# Patient Record
Sex: Female | Born: 1935 | Race: White | Hispanic: No | Smoking: Never smoker
Health system: Southern US, Community
[De-identification: ages and names within clinical notes are randomized; demographics above are authoritative.]

## PROBLEM LIST (undated history)

## (undated) DIAGNOSIS — I471 Supraventricular tachycardia, unspecified: Secondary | ICD-10-CM

## (undated) DIAGNOSIS — I10 Essential (primary) hypertension: Secondary | ICD-10-CM

## (undated) DIAGNOSIS — E785 Hyperlipidemia, unspecified: Secondary | ICD-10-CM

## (undated) DIAGNOSIS — F0390 Unspecified dementia without behavioral disturbance: Secondary | ICD-10-CM

## (undated) DIAGNOSIS — N3010 Interstitial cystitis (chronic) without hematuria: Secondary | ICD-10-CM

## (undated) HISTORY — DX: Essential (primary) hypertension: I10

## (undated) HISTORY — DX: Supraventricular tachycardia: I47.1

## (undated) HISTORY — PX: REVISION UROSTOMY CUTANEOUS: SUR1282

## (undated) HISTORY — PX: CYSTECTOMY W/ URETEROILEAL CONDUIT: SUR361

## (undated) HISTORY — PX: CHOLECYSTECTOMY: SHX55

## (undated) HISTORY — DX: Hyperlipidemia, unspecified: E78.5

## (undated) HISTORY — DX: Supraventricular tachycardia, unspecified: I47.10

## (undated) HISTORY — PX: ABDOMINAL HYSTERECTOMY: SHX81

## (undated) HISTORY — DX: Interstitial cystitis (chronic) without hematuria: N30.10

---

## 1997-09-17 ENCOUNTER — Ambulatory Visit (HOSPITAL_BASED_OUTPATIENT_CLINIC_OR_DEPARTMENT_OTHER): Admission: RE | Admit: 1997-09-17 | Discharge: 1997-09-17 | Payer: Self-pay | Admitting: Orthopedic Surgery

## 1997-09-23 ENCOUNTER — Encounter: Admission: RE | Admit: 1997-09-23 | Discharge: 1997-12-22 | Payer: Self-pay | Admitting: Orthopedic Surgery

## 1998-01-14 ENCOUNTER — Encounter: Payer: Self-pay | Admitting: Urology

## 1998-01-16 ENCOUNTER — Ambulatory Visit (HOSPITAL_COMMUNITY): Admission: RE | Admit: 1998-01-16 | Discharge: 1998-01-16 | Payer: Self-pay | Admitting: Urology

## 1998-01-31 ENCOUNTER — Ambulatory Visit (HOSPITAL_COMMUNITY): Admission: RE | Admit: 1998-01-31 | Discharge: 1998-01-31 | Payer: Self-pay | Admitting: Urology

## 1999-03-05 ENCOUNTER — Encounter: Payer: Self-pay | Admitting: Urology

## 1999-03-06 ENCOUNTER — Ambulatory Visit (HOSPITAL_COMMUNITY): Admission: RE | Admit: 1999-03-06 | Discharge: 1999-03-06 | Payer: Self-pay | Admitting: Urology

## 1999-03-06 ENCOUNTER — Encounter: Payer: Self-pay | Admitting: Urology

## 1999-03-06 ENCOUNTER — Encounter (INDEPENDENT_AMBULATORY_CARE_PROVIDER_SITE_OTHER): Payer: Self-pay | Admitting: *Deleted

## 1999-10-05 ENCOUNTER — Encounter (INDEPENDENT_AMBULATORY_CARE_PROVIDER_SITE_OTHER): Payer: Self-pay | Admitting: Specialist

## 1999-10-05 ENCOUNTER — Encounter: Payer: Self-pay | Admitting: Urology

## 1999-10-05 ENCOUNTER — Inpatient Hospital Stay (HOSPITAL_COMMUNITY): Admission: RE | Admit: 1999-10-05 | Discharge: 1999-10-19 | Payer: Self-pay | Admitting: Urology

## 1999-10-12 ENCOUNTER — Encounter: Payer: Self-pay | Admitting: Urology

## 1999-10-13 ENCOUNTER — Encounter: Payer: Self-pay | Admitting: Urology

## 1999-10-16 ENCOUNTER — Encounter: Payer: Self-pay | Admitting: Gastroenterology

## 2000-07-13 ENCOUNTER — Encounter: Admission: RE | Admit: 2000-07-13 | Discharge: 2000-07-13 | Payer: Self-pay | Admitting: Urology

## 2000-07-13 ENCOUNTER — Encounter: Payer: Self-pay | Admitting: Urology

## 2004-02-18 ENCOUNTER — Ambulatory Visit: Payer: Self-pay | Admitting: Family Medicine

## 2004-04-10 ENCOUNTER — Ambulatory Visit: Payer: Self-pay | Admitting: Family Medicine

## 2004-08-03 ENCOUNTER — Other Ambulatory Visit: Admission: RE | Admit: 2004-08-03 | Discharge: 2004-08-03 | Payer: Self-pay | Admitting: Gynecology

## 2004-09-07 ENCOUNTER — Ambulatory Visit: Payer: Self-pay | Admitting: Family Medicine

## 2004-10-26 ENCOUNTER — Ambulatory Visit: Payer: Self-pay | Admitting: Family Medicine

## 2004-11-18 ENCOUNTER — Ambulatory Visit: Payer: Self-pay | Admitting: Family Medicine

## 2005-01-01 ENCOUNTER — Ambulatory Visit: Payer: Self-pay | Admitting: Family Medicine

## 2005-02-25 ENCOUNTER — Ambulatory Visit: Payer: Self-pay | Admitting: Family Medicine

## 2005-06-15 ENCOUNTER — Ambulatory Visit: Payer: Self-pay | Admitting: Family Medicine

## 2005-07-05 ENCOUNTER — Ambulatory Visit: Payer: Self-pay | Admitting: Family Medicine

## 2005-07-08 ENCOUNTER — Ambulatory Visit (HOSPITAL_COMMUNITY): Admission: RE | Admit: 2005-07-08 | Discharge: 2005-07-08 | Payer: Self-pay | Admitting: Orthopaedic Surgery

## 2005-07-12 ENCOUNTER — Encounter: Admission: RE | Admit: 2005-07-12 | Discharge: 2005-08-11 | Payer: Self-pay | Admitting: Orthopaedic Surgery

## 2005-08-30 ENCOUNTER — Ambulatory Visit: Payer: Self-pay | Admitting: Family Medicine

## 2005-09-02 ENCOUNTER — Other Ambulatory Visit: Admission: RE | Admit: 2005-09-02 | Discharge: 2005-09-02 | Payer: Self-pay | Admitting: Gynecology

## 2006-02-08 ENCOUNTER — Ambulatory Visit: Payer: Self-pay | Admitting: Family Medicine

## 2006-05-03 ENCOUNTER — Ambulatory Visit: Payer: Self-pay | Admitting: Family Medicine

## 2006-05-18 ENCOUNTER — Ambulatory Visit: Payer: Self-pay | Admitting: Family Medicine

## 2006-06-14 ENCOUNTER — Ambulatory Visit (HOSPITAL_COMMUNITY): Admission: RE | Admit: 2006-06-14 | Discharge: 2006-06-14 | Payer: Self-pay | Admitting: Ophthalmology

## 2006-07-05 ENCOUNTER — Ambulatory Visit (HOSPITAL_COMMUNITY): Admission: RE | Admit: 2006-07-05 | Discharge: 2006-07-05 | Payer: Self-pay | Admitting: Ophthalmology

## 2006-08-22 ENCOUNTER — Ambulatory Visit: Payer: Self-pay | Admitting: Family Medicine

## 2006-08-29 ENCOUNTER — Ambulatory Visit: Payer: Self-pay | Admitting: Family Medicine

## 2006-09-05 ENCOUNTER — Ambulatory Visit: Payer: Self-pay | Admitting: Family Medicine

## 2006-11-23 ENCOUNTER — Other Ambulatory Visit: Admission: RE | Admit: 2006-11-23 | Discharge: 2006-11-23 | Payer: Self-pay | Admitting: Gynecology

## 2007-07-21 ENCOUNTER — Ambulatory Visit (HOSPITAL_COMMUNITY): Admission: RE | Admit: 2007-07-21 | Discharge: 2007-07-21 | Payer: Self-pay | Admitting: Orthopaedic Surgery

## 2008-02-22 ENCOUNTER — Ambulatory Visit (HOSPITAL_COMMUNITY): Admission: RE | Admit: 2008-02-22 | Discharge: 2008-02-22 | Payer: Self-pay | Admitting: Neurology

## 2009-12-23 ENCOUNTER — Encounter: Admission: RE | Admit: 2009-12-23 | Discharge: 2009-12-23 | Payer: Self-pay | Admitting: Gynecology

## 2010-04-04 ENCOUNTER — Encounter: Payer: Self-pay | Admitting: Gynecology

## 2010-07-31 NOTE — H&P (Signed)
[facility]  Patient:    Theresa Hunt, Theresa Hunt [facility]                 MRN: [id number] Adm. Date:  [id number] Attending:  Londell Moh CC:         Jamison Neighbor, M.D.             Curly Shores, M.D.                         History and Physical  ADMITTING DIAGNOSIS:  End-stage interstitial cystitis with persistent hematuria and chronic pain.  HISTORY OF PRESENT ILLNESS:  This 77 year old female has had interstitial cystitis for many years.  She has failed standard therapy, including DMSL, Elmiron, Atarax, antidepressants, Neurontin, as well as self inspiration therapy using heparin and Marcaine.  The patient has been offered alternative therapies, such as the SKK drug study, _________ implant program, as well as the possibility of various forms of surgical intervention.  The patient has elected to undergo a cystectomy and ileal conduit diversion.  She understands there is no guarantee this will control her pain but will allow it to eliminate her every five minutes urinary frequency.  She understands that there are potential complications with the procedure which have been carefully discussed with her.  She understands all the potential alternative therapy and has elected to undergo this procedure.  PAST MEDICAL HISTORY:  Otherwise unremarkable.  She does have known hypertension and has some problems with arthritis.  CURRENT MEDICATIONS:  Atenolol and Premarin.  She had been on Elmiron and other medications, all of which she has discontinued.  PAST SURGICAL HISTORY:  The patients previous surgery includes cholecystectomy, total abdominal hysterectomy, various urologic procedures, several orthopedic procedures including right wrist surgery.  SOCIAL HISTORY:  Unremarkable.  She is married and has three children.  FAMILY HISTORY:  Noncontributory.  There is a history of anemia, heart disease, diabetes, COPD, stroke, and thyroid disease.  She does not  smoke or use alcohol.  PHYSICAL EXAMINATION:  GENERAL:  On examination today she is a well-developed, well-nourished female.  VITAL SIGNS:  Temperature 97.5, pulse 57, respirations 20, blood pressure 157/68.  HEENT:  Normocephalic, atraumatic.  Cranial nerves 2-12 grossly intact.  NECK:  Supple, no adenopathy or thyromegaly.  LUNGS:  Clear.  HEART:  Regular rate and rhythm without murmurs, thrills, gallops, rubs, or heaves.  ABDOMEN:  Abdomen shows a lower midline incision that is well healed, as well as a _________ incision with no complications.  PELVIC:  Shows no abnormalities, other than the fact that she has extreme pain.  She has no cystocele, rectocele, as well as no mass, although I understand she had a recent Pap smear that was normal.  EXTREMITIES:  No cyanosis, clubbing, edema.  NEUROLOGIC:  Appears grossly intact.  IMPRESSION:  End-stage interstitial cystitis with chronic hematuria.  PLAN:  Cystectomy, _________  diverse. DD:  10/06/99 TD:  10/07/99 Job: [id number]

## 2010-07-31 NOTE — Discharge Summary (Signed)
[facility]  Patient:    Theresa Hunt, Theresa Hunt [facility]                 MRN: [id number] Adm. Date:  [id number] Disc. Date: [id number] Attending:  Londell Moh                           Discharge Summary  PRINCIPAL DIAGNOSES: 1. Chronic interstitial cystitis. 2. Hematuria. 3. Postoperative ileus. 4. Hypertension.  PRINCIPAL PROCEDURE:  Cystectomy and ileal conduit on November 06, 1999.  HISTORY OF PRESENT ILLNESS:  This 77 year old female has had interstitial cystitis for many years.  She has failed standard therapy including DMSO, Elmiron, Atarax, antidepressants, Neurontin, and intravesical installations. The patient has been offered opportunities such as experimental drug programs or ______ implantation but has requested that a cystectomy be performed.  She was fully appraised as to all the risks and benefits, including the very real potential complications, as well as the possibility that this may not eliminate her pain.  She is to be admitted in preparation for a cystectomy.  PAST MEDICAL HISTORY:  Unremarkable.  She is known to have: 1. Hypertension. 2. Arthritis.  ADMISSION MEDICATIONS:  Atenolol and Premarin.  The patient discontinued all her bladder medications, as none of them were working.  PAST SURGICAL HISTORY: 1. Cholecystectomy. 2. Total abdominal hysterectomy. 3. Several urologic procedures. 4. Several orthopedic procedures.  SOCIAL HISTORY, FAMILY HISTORY, REVIEW OF SYSTEMS:  Are placed on the initial history and physical.  LABORATORY DATA:  The patients laboratory values were normal.  HOSPITAL COURSE:  She was given an appropriate preoperative preparation, including type and cross for blood, bowel prep, antibiotic therapy.  On October 06, 1999, she underwent cystectomy and conduit diversion.  At first her postoperative course was quite unremarkable.  Her postoperative hematocrit was 37.4, with a hemoglobin of 13.4.  The only  abnormality noted was a slight increase in potassium, which was thought to be a hemolyzed specimen.  Urine output was slightly down at first but improved with appropriate hydration. The patient was moved along to a regular diet without much difficulty.  The jp drainage on the right-hand side was relatively brisk; on the left-hand side it was quite minimal.  The JP drainage was ______ , and it turned out to have a creatinine of less than 1, suggesting this was lymphatic fluid and not urine. The patient developed problems with nausea and pain on the evening of October 12, 1999.  Dr. Patsi Sears was called about a KUB that had been ordered which showed evidence of free air in the abdomen.  Obviously, the patient had a recent cystectomy.  It was felt by the radiologist that this might represent more free air than one normally sees.  Dr. Patsi Sears obtained a CT scan which showed a question of a mass in the left lower quadrant.  He initially had ordered a CT-directed biopsy, but it was clear to me that this most likely represented a resolving hematoma.  The patients white count increased rapidly to 26,000.  She was put back on antibiotic therapies, and her white count then dropped back down towards the normal range.  Some spontaneous drainage coming out of the vagina was noted, and it was felt that this was most likely a resolving hematoma.  The patient had multiple bowel movements.  We had some difficulty with nausea.  We made several attempts to get her NG out.  When we were having difficulty  with that, GI was consulted.  They noted that she did have some evidence of postoperative ileus, but we were able to remove the NG tube after several days.  The patient had multiple bowel movements, multiple flatus, and her temperature had dropped down.  By October 19, 1999, she was eating a regular diet.  She had had no temperature in over 24 hours.  She had moved her bowels several times, and her white blood  count had dropped to normal.  The patient felt well and wanted to go home.  Arrangements were made for her to see a home health care nurse to deal with her conduit.  Her staples had been removed.  Her wound looked good, and her JP drains had both been removed, as had her double-J stents. DD:  10/23/99 TD:  10/25/99 Job: [id number]

## 2010-07-31 NOTE — Op Note (Signed)
[facility]  Patient:    Theresa Hunt, Theresa Hunt [facility]                 MRN: [id number] Proc. Date: 10/06/99 Adm. Date:  [id number] Disc. Date: [id number] Attending:  Londell Moh                           Operative Report  REDICTATION  PREOPERATIVE DIAGNOSIS:  End-stage interstitial cystitis with uncontrolled pain.  POSTOPERATIVE DIAGNOSIS:  End-stage interstitial cystitis with uncontrolled pain.  PROCEDURE:  Cystectomy and ileoconduit urinary diversion.  SURGEON:  Jamison Neighbor, M.D.  ASSISTANT:  Lindaann Slough, M.D.  ANESTHESIA:  General.  COMPLICATIONS:  None.  DRAINS:  Bilateral single-J catheter and two Jackson-Pratt drains plus an NG tube.  BRIEF HISTORY:  This is a 77 year old female who has severe interstitial cystitis with associated hematuria.  The patient has poorly controlled pain despite the use of various agents including OxyContin, Duragesic patches, etc. She had failed all forms of standard therapy and is completely incapacitated. The patient has requested that a cystectomy be performed.  She has been appraised of this with all the risks and benefits including the possibility of injury to adjacent structures, the possibility of transfusion, as well as the fact that there is no guarantee this will eliminate her pain.  She gave full and informed consent.  DESCRIPTION OF PROCEDURE:  After the successful induction of general anesthesia, the patient was placed in the modified lithotomy position in low lithotomy stirrups.  She was then prepped and draped in the usual sterile fashion.  A midline incision was made and carried down through Scarpas fascia with old incision in the lower abdomen that were utilized.  The peritoneum was entered superiorly.  Multiple adhesions were taken down.  Attempt was made to dissect the peritoneum off the back of the bladder and try to salvage the peritoneum where possible.  This was successfully  done.  The ureters were identified.  They were dissected away from the retroperitoneal tissues.  They were dissected free all the way down to the entry into the bladder where it was doubly clipped and transected, and this was done on both sides.  The space between the bladder and vagina were then very carefully developed using a combination of electrocautery and sharp dissection.  The pedicles were developed on each side and were taken down with #1 silk ties.  The dissection was taken all the way down to the endopelvic fascia.  The dorsal vein was identified and was transected, and was oversewn with 2-0 Vicryl.  The urethra was dissected away from its surrounding tissue through the vagina.  The entire specimen was then removed en bloc.  This consisted of bladder dissected off the underlying vagina plus the urethra.  The opening of the vagina was then closed with a series of sutures of 2-0 Vicryl.  The opening in the urethra was also closed by closing over the endopelvic fascia up internally.  Hemostasis was obtained in the operative site with electrocautery as well as oversewn with Vicryl sutures.  The conduit was then constructed in the usual fashion. An appropriate section of the small bowel was identified and marked off with marking sutures.  The mesentery was partially taken down in the usual fashion. The GIA was used to cut across the valve.  The bowel was reanastomosed with the GIA and the TA55.  The ________ was closed with silk suture.  The  _________ was taken off, removed the staples, and then was oversewn with Vicryl suture.  The top of the ______ was taken off and single-J stents were passed.  Two stab wounds were made.  The ureters were spatulated and anastomosis made on each side with 4-0 Vicryl sutures.  The patient had single-J passed up into the kidney and secured with 5-0 chromic.  The conduit was then brought out through a separate previously marked site which  was anastomosed in the usual fashion and then matured at that time.  The fascia was closed with a running suture of #1 PDS.  The patient had two Jackson-Pratt drains placed, one on the right side and one on the left.  The skin was closed with surgical clips.  The patient tolerated the procedure well and was taken to the recovery room in good condition. DD:  10/23/99 TD:  10/25/99 Job: [id number]

## 2011-05-20 ENCOUNTER — Other Ambulatory Visit: Payer: Self-pay | Admitting: Urology

## 2011-05-20 DIAGNOSIS — N289 Disorder of kidney and ureter, unspecified: Secondary | ICD-10-CM

## 2011-06-28 ENCOUNTER — Ambulatory Visit (HOSPITAL_COMMUNITY)
Admission: RE | Admit: 2011-06-28 | Discharge: 2011-06-28 | Disposition: A | Discharge status: Home / Self Care | Payer: Medicare Other | Source: Ambulatory Visit | Attending: Urology | Admitting: Urology

## 2011-06-28 DIAGNOSIS — N289 Disorder of kidney and ureter, unspecified: Secondary | ICD-10-CM | POA: Insufficient documentation

## 2011-06-28 DIAGNOSIS — Q619 Cystic kidney disease, unspecified: Secondary | ICD-10-CM | POA: Insufficient documentation

## 2011-07-02 ENCOUNTER — Ambulatory Visit (INDEPENDENT_AMBULATORY_CARE_PROVIDER_SITE_OTHER): Payer: Medicare Other | Admitting: Urology

## 2011-07-02 DIAGNOSIS — Q619 Cystic kidney disease, unspecified: Secondary | ICD-10-CM

## 2011-07-02 DIAGNOSIS — K9402 Colostomy infection: Secondary | ICD-10-CM

## 2011-07-02 DIAGNOSIS — IMO0002 Reserved for concepts with insufficient information to code with codable children: Secondary | ICD-10-CM

## 2012-05-14 DIAGNOSIS — R079 Chest pain, unspecified: Secondary | ICD-10-CM

## 2012-05-15 DIAGNOSIS — R079 Chest pain, unspecified: Secondary | ICD-10-CM

## 2012-05-22 ENCOUNTER — Other Ambulatory Visit: Payer: Self-pay | Admitting: Urology

## 2012-05-22 DIAGNOSIS — N289 Disorder of kidney and ureter, unspecified: Secondary | ICD-10-CM

## 2012-05-24 ENCOUNTER — Encounter: Payer: Self-pay | Admitting: Cardiology

## 2012-07-03 ENCOUNTER — Ambulatory Visit (HOSPITAL_COMMUNITY)
Admission: RE | Admit: 2012-07-03 | Discharge: 2012-07-03 | Disposition: A | Discharge status: Home / Self Care | Payer: Medicare Other | Source: Ambulatory Visit | Attending: Urology | Admitting: Urology

## 2012-07-03 DIAGNOSIS — N289 Disorder of kidney and ureter, unspecified: Secondary | ICD-10-CM

## 2012-07-03 DIAGNOSIS — Q619 Cystic kidney disease, unspecified: Secondary | ICD-10-CM | POA: Insufficient documentation

## 2012-07-07 ENCOUNTER — Ambulatory Visit (INDEPENDENT_AMBULATORY_CARE_PROVIDER_SITE_OTHER): Payer: Medicare Other | Admitting: Urology

## 2012-07-07 DIAGNOSIS — N281 Cyst of kidney, acquired: Secondary | ICD-10-CM

## 2012-07-07 DIAGNOSIS — IMO0002 Reserved for concepts with insufficient information to code with codable children: Secondary | ICD-10-CM

## 2012-07-07 DIAGNOSIS — N3010 Interstitial cystitis (chronic) without hematuria: Secondary | ICD-10-CM

## 2012-07-07 DIAGNOSIS — K9402 Colostomy infection: Secondary | ICD-10-CM

## 2012-08-02 ENCOUNTER — Encounter: Payer: Self-pay | Admitting: Cardiology

## 2012-09-06 ENCOUNTER — Ambulatory Visit (INDEPENDENT_AMBULATORY_CARE_PROVIDER_SITE_OTHER): Payer: Medicare Other | Admitting: Cardiology

## 2012-09-06 ENCOUNTER — Encounter: Payer: Self-pay | Admitting: Cardiology

## 2012-09-06 VITALS — BP 145/77 | HR 62 | Ht 63.0 in | Wt 152.2 lb

## 2012-09-06 DIAGNOSIS — I471 Supraventricular tachycardia: Secondary | ICD-10-CM

## 2012-09-06 DIAGNOSIS — I498 Other specified cardiac arrhythmias: Secondary | ICD-10-CM

## 2012-09-06 MED ORDER — METOPROLOL SUCCINATE ER 50 MG PO TB24: 50.0000 mg | ORAL_TABLET | Freq: Every day | ORAL | Status: DC

## 2012-09-06 MED ORDER — METOPROLOL SUCCINATE ER 100 MG PO TB24: 100.0000 mg | ORAL_TABLET | Freq: Every day | ORAL | Status: DC

## 2012-09-06 NOTE — Patient Instructions (Addendum)
Please stop the metoprolol tartrate and start Metoprolol succinate 150 mg a day.  This will be in 2 prescriptions (a 50 mg tablet and 100 mg tablet)  Stop Protonix. Continue all other medications as listed.  Follow up in 3 months with Dr Antoine Poche in [place].

## 2012-09-06 NOTE — Progress Notes (Signed)
HPI The patient was a patient of Dr. Margarita Mail although we did see her once prior to this.. She was seen at [facility] for SVT.  I have reviewed these records. Her first episode happened when she was knitting and she started having throbbing in her throat. A second episode happened while she was walking through [employer] at that time she did have some left arm discomfort.  She has had 2 hospitalizations for this in March. She has been treated with beta blockers and has had no symptoms consistent with recurrent SVT since that time.  She reports wearing an event monitor after discharge and being told it was normal. She lives by herself. She does her chores. She is limited by some joint pains and back pain. However, she's not having any recurrent chest pressure, neck or arm discomfort. She's not having any palpitations, presyncope or syncope. She's not having any PND or orthopnea. She's had none of the arm discomfort or rapid heart rate that she had previously.  Allergies  Allergen Reactions  . Codeine     Current Outpatient Prescriptions  Medication Sig Dispense Refill  . aspirin 81 MG tablet Take 81 mg by mouth daily.      . Cholecalciferol (VITAMIN D3) 3000 UNITS TABS Take 2,000 Units by mouth.      . Cyanocobalamin (VITAMIN B 12 PO) Take by mouth.      . fish oil-omega-3 fatty acids 1000 MG capsule Take 1 g by mouth daily.      . fluticasone (FLONASE) 50 MCG/ACT nasal spray Place 2 sprays into the nose daily.      Marland Kitchen MAGNESIUM PO Take by mouth.      . METOPROLOL TARTRATE PO Take 50 mg by mouth every 8 (eight) hours.      . pantoprazole (PROTONIX) 40 MG tablet Take 40 mg by mouth daily.      . pravastatin (PRAVACHOL) 80 MG tablet Take 80 mg by mouth daily.       No current facility-administered medications for this visit.    Past Medical History  Diagnosis Date  . Hypertension   . Hyperlipidemia   . SVT (supraventricular tachycardia)   . Interstitial cystitis     Past Surgical History    Procedure Laterality Date  . Abdominal hysterectomy    . Cholecystectomy      Family History  Problem Relation Age of Onset  . Emphysema Father   . CAD Mother 23  . Cancer Daughter     Breast    History   Social History  . Marital Status: Widowed    Spouse Name: N/A    Number of Children: 3  . Years of Education: N/A   Occupational History  . Not on file.   Social History Main Topics  . Smoking status: Never Smoker   . Smokeless tobacco: Not on file  . Alcohol Use: Not on file  . Drug Use: Not on file  . Sexually Active: Not on file   Other Topics Concern  . Not on file   Social History Narrative   Lives alone.     ROS:     Dentures, constipation, leg swelling.  Otherwise as stated in the HPI and negative for all other systems.  PHYSICAL EXAM BP 145/77  Pulse 62  Ht 5\' 3"  (1.6 m)  Wt 152 lb 3.2 oz (69.037 kg)  BMI 26.97 kg/m2 GENERAL:  Well appearing HEENT:  Pupils equal round and reactive, fundi not visualized, oral mucosa unremarkable  NECK:  No jugular venous distention, waveform within normal limits, carotid upstroke brisk and symmetric, no bruits, no thyromegaly LYMPHATICS:  No cervical, inguinal adenopathy LUNGS:  Clear to auscultation bilaterally BACK:  No CVA tenderness, lordosis CHEST:  Unremarkable HEART:  PMI not displaced or sustained,S1 and S2 within normal limits, no S3, no S4, no clicks, no rubs, no murmurs ABD:  Flat, positive bowel sounds normal in frequency in pitch, no bruits, no rebound, no guarding, no midline pulsatile mass, no hepatomegaly, no splenomegaly, urostomy EXT:  2 plus pulses throughout, no edema, no cyanosis no clubbing SKIN:  No rashes no nodules NEURO:  Cranial nerves II through XII grossly intact, motor grossly intact throughout PSYCH:  Cognitively intact, oriented to person place and time   EKG:  Sinus rhythm, rate 62, axis within normal limits, intervals within normal limits, no acute ST-T wave changes.  09/06/2012  ASSESSMENT AND PLAN  SVT:  I have reviewed available [facility] records ( (Greater than 40 minutes reviewing all data with greater than 50% face to face with the patient).  I do not see a documentation of the supraventricular tachycardia but I will further for these records. However, she currently has no symptoms with her beta blocker. She is forgetting often times to take all 3 doses. Therefore I'm going to try to change to Toprol-XL 150 mg daily. Of note I did review her stress perfusion study which demonstrated no evidence of ischemia or infarct. This is further addressed below.  ARM PAIN:  This was associated with the supraventricular tachycardia. No further workup is suggested.  HTN:  The blood pressure is at target. No change in medications is indicated. We will continue with therapeutic lifestyle changes (TLC).

## 2012-12-06 ENCOUNTER — Ambulatory Visit (INDEPENDENT_AMBULATORY_CARE_PROVIDER_SITE_OTHER): Payer: Medicare Other | Admitting: Cardiology

## 2012-12-06 ENCOUNTER — Encounter: Payer: Self-pay | Admitting: Cardiology

## 2012-12-06 VITALS — BP 147/69 | HR 68 | Ht 65.0 in | Wt 152.0 lb

## 2012-12-06 DIAGNOSIS — I471 Supraventricular tachycardia: Secondary | ICD-10-CM

## 2012-12-06 DIAGNOSIS — I498 Other specified cardiac arrhythmias: Secondary | ICD-10-CM

## 2012-12-06 NOTE — Patient Instructions (Addendum)
The current medical regimen is effective;  continue present plan and medications.  Follow up as needed 

## 2012-12-06 NOTE — Progress Notes (Signed)
   HPI The patient for follow up of SVT.  At the last visit which was our first together I evaluated some outside records and couldn't really document the SVT.  But she was doing better on beta blockers. I consolidated this to once daily dosing.  Since that initial visit with me she's had no further symptoms. She denies any chest pressure, neck or arm discomfort. She's not had any further palpitations, presyncope or syncope. She does her routine daily activities.  Allergies  Allergen Reactions  . Codeine     Current Outpatient Prescriptions  Medication Sig Dispense Refill  . aspirin 81 MG tablet Take 81 mg by mouth daily.      . Cholecalciferol (VITAMIN D3) 3000 UNITS TABS Take 2,000 Units by mouth.      . Cyanocobalamin (VITAMIN B 12 PO) Take by mouth.      . fish oil-omega-3 fatty acids 1000 MG capsule Take 1 g by mouth daily.      . fluticasone (FLONASE) 50 MCG/ACT nasal spray Place 2 sprays into the nose daily.      Marland Kitchen MAGNESIUM PO Take by mouth.      . metoprolol succinate (TOPROL-XL) 100 MG 24 hr tablet Take 1 tablet (100 mg total) by mouth daily. Take with or immediately following a meal.  30 tablet  6  . metoprolol succinate (TOPROL-XL) 50 MG 24 hr tablet Take 1 tablet (50 mg total) by mouth daily. Take with or immediately following a meal.  30 tablet  6  . pravastatin (PRAVACHOL) 80 MG tablet Take 80 mg by mouth daily.       No current facility-administered medications for this visit.    Past Medical History  Diagnosis Date  . Hypertension   . Hyperlipidemia   . SVT (supraventricular tachycardia)   . Interstitial cystitis     Past Surgical History  Procedure Laterality Date  . Abdominal hysterectomy    . Cholecystectomy    . Revision urostomy cutaneous      ROS:     As stated in the HPI and negative for all other systems.  PHYSICAL EXAM BP 147/69  Pulse 68  Ht 5\' 5"  (1.651 m)  Wt 152 lb (68.947 kg)  BMI 25.29 kg/m2 GENERAL:  Well appearing NECK:  No jugular  venous distention, waveform within normal limits, carotid upstroke brisk and symmetric, no bruits, no thyromegaly LUNGS:  Clear to auscultation bilaterally BACK:  No CVA tenderness, lordosis CHEST:  Unremarkable HEART:  PMI not displaced or sustained,S1 and S2 within normal limits, no S3, no S4, no clicks, no rubs, no murmurs ABD:  Flat, positive bowel sounds normal in frequency in pitch, no bruits, no rebound, no guarding, no midline pulsatile mass, no hepatomegaly, no splenomegaly, urostomy EXT:  2 plus pulses throughout, no edema, no cyanosis no clubbing  EKG:  Sinus rhythm, rate 65, axis within normal limits, intervals within normal limits, no acute ST-T wave changes. 12/06/2012  ASSESSMENT AND PLAN  SVT:  The patient has had no recurrent symptoms. No change in therapy is indicated.  ARM PAIN:  She has had no further discomfort since she was hospitalized. I did review the records previously and she had a negative perfusion study. No change in therapy or further imaging is indicated.  HTN:  The blood pressure is at target. No change in medications is indicated. We will continue with therapeutic lifestyle changes (TLC).

## 2013-03-21 ENCOUNTER — Telehealth: Payer: Self-pay | Admitting: Cardiology

## 2013-03-21 ENCOUNTER — Other Ambulatory Visit: Payer: Self-pay

## 2013-03-21 MED ORDER — METOPROLOL SUCCINATE ER 50 MG PO TB24: 50.0000 mg | ORAL_TABLET | Freq: Every day | ORAL | Status: DC

## 2013-03-21 NOTE — Telephone Encounter (Signed)
Pt wanted to know if she should continue on current medications.  Advised as instructed at last office visit she is to continue on all meds and follow up as needed.

## 2013-03-21 NOTE — Telephone Encounter (Signed)
New message        Pt wants to know if she needs to continue on meds

## 2013-05-30 ENCOUNTER — Other Ambulatory Visit: Payer: Self-pay | Admitting: Cardiology

## 2013-07-03 ENCOUNTER — Other Ambulatory Visit: Payer: Self-pay | Admitting: Urology

## 2013-07-03 DIAGNOSIS — Q619 Cystic kidney disease, unspecified: Secondary | ICD-10-CM

## 2013-07-16 ENCOUNTER — Ambulatory Visit (HOSPITAL_COMMUNITY)
Admission: RE | Admit: 2013-07-16 | Discharge: 2013-07-16 | Disposition: A | Discharge status: Home / Self Care | Payer: Medicare Other | Source: Ambulatory Visit | Attending: Urology | Admitting: Urology

## 2013-07-16 DIAGNOSIS — Q618 Other cystic kidney diseases: Secondary | ICD-10-CM | POA: Insufficient documentation

## 2013-07-16 DIAGNOSIS — Q619 Cystic kidney disease, unspecified: Secondary | ICD-10-CM

## 2013-07-20 ENCOUNTER — Ambulatory Visit (INDEPENDENT_AMBULATORY_CARE_PROVIDER_SITE_OTHER): Payer: Medicare Other | Admitting: Urology

## 2013-07-20 DIAGNOSIS — N281 Cyst of kidney, acquired: Secondary | ICD-10-CM

## 2013-07-20 DIAGNOSIS — N3010 Interstitial cystitis (chronic) without hematuria: Secondary | ICD-10-CM

## 2013-10-22 DIAGNOSIS — E785 Hyperlipidemia, unspecified: Secondary | ICD-10-CM | POA: Insufficient documentation

## 2013-11-26 ENCOUNTER — Other Ambulatory Visit: Payer: Self-pay | Admitting: Cardiology

## 2014-04-23 DIAGNOSIS — I10 Essential (primary) hypertension: Secondary | ICD-10-CM | POA: Insufficient documentation

## 2014-04-23 DIAGNOSIS — R413 Other amnesia: Secondary | ICD-10-CM | POA: Insufficient documentation

## 2014-09-18 ENCOUNTER — Other Ambulatory Visit: Payer: Self-pay | Admitting: Urology

## 2014-09-18 DIAGNOSIS — Q6100 Congenital renal cyst, unspecified: Secondary | ICD-10-CM

## 2014-09-20 ENCOUNTER — Ambulatory Visit (HOSPITAL_COMMUNITY)
Admission: RE | Admit: 2014-09-20 | Discharge: 2014-09-20 | Disposition: A | Discharge status: Home / Self Care | Payer: Medicare Other | Source: Ambulatory Visit | Attending: Urology | Admitting: Urology

## 2014-09-20 ENCOUNTER — Ambulatory Visit (INDEPENDENT_AMBULATORY_CARE_PROVIDER_SITE_OTHER): Payer: Medicare Other | Admitting: Urology

## 2014-09-20 DIAGNOSIS — Q6100 Congenital renal cyst, unspecified: Secondary | ICD-10-CM

## 2014-09-20 DIAGNOSIS — N281 Cyst of kidney, acquired: Secondary | ICD-10-CM | POA: Insufficient documentation

## 2014-09-20 DIAGNOSIS — K9402 Colostomy infection: Secondary | ICD-10-CM | POA: Diagnosis not present

## 2014-09-20 DIAGNOSIS — N3289 Other specified disorders of bladder: Secondary | ICD-10-CM | POA: Diagnosis not present

## 2015-01-22 ENCOUNTER — Other Ambulatory Visit: Payer: Self-pay

## 2015-01-22 MED ORDER — METOPROLOL SUCCINATE ER 50 MG PO TB24: ORAL_TABLET | ORAL | Status: DC

## 2015-02-27 ENCOUNTER — Other Ambulatory Visit: Payer: Self-pay | Admitting: Cardiology

## 2015-03-31 ENCOUNTER — Telehealth: Payer: Self-pay | Admitting: *Deleted

## 2015-03-31 MED ORDER — METOPROLOL SUCCINATE ER 50 MG PO TB24: 50.0000 mg | ORAL_TABLET | Freq: Every day | ORAL | Status: DC

## 2015-03-31 NOTE — Telephone Encounter (Signed)
Will send #30 with no refills because patient will run out today. Advise that patient request future refills from her primary care doctor.

## 2015-03-31 NOTE — Telephone Encounter (Signed)
Patient left a voicemail on the refill line stating that she has not been seen since 2014, but Dr Percival Spanish told her that he did not need to see her anymore. She has not been having any problems, and would like to see if he would refill her cardiac meds. She takes her last metoprolol today. Please advise. She can be reached at [phone]. Thanks, MI

## 2015-03-31 NOTE — Addendum Note (Signed)
Addended by: Diana Eves on: 03/31/2015 01:04 PM   Modules accepted: Orders

## 2015-04-11 DIAGNOSIS — F0390 Unspecified dementia without behavioral disturbance: Secondary | ICD-10-CM | POA: Insufficient documentation

## 2015-04-29 ENCOUNTER — Other Ambulatory Visit: Payer: Self-pay | Admitting: Cardiology

## 2015-04-29 NOTE — Telephone Encounter (Signed)
Rx request sent to pharmacy.  

## 2015-05-20 ENCOUNTER — Other Ambulatory Visit: Payer: Self-pay | Admitting: *Deleted

## 2015-05-20 MED ORDER — METOPROLOL SUCCINATE ER 50 MG PO TB24: 50.0000 mg | ORAL_TABLET | Freq: Every day | ORAL | Status: DC

## 2015-05-20 NOTE — Telephone Encounter (Signed)
Patient would like someone to call her at [phone], it is in regards to her medication.

## 2015-05-21 ENCOUNTER — Other Ambulatory Visit: Payer: Self-pay | Admitting: *Deleted

## 2015-05-21 MED ORDER — METOPROLOL SUCCINATE ER 50 MG PO TB24: 50.0000 mg | ORAL_TABLET | Freq: Every day | ORAL | Status: DC

## 2015-05-21 NOTE — Telephone Encounter (Signed)
REFILL 

## 2015-06-17 NOTE — Progress Notes (Signed)
HPI The patient for follow up of SVT.  At a previous visit I evaluated some outside records and couldn't really document the SVT.  However, she was doing better on beta blockers so I continued and changed to once daily dosing.  This was almost 3 years ago.  Since that time she's done well. She does her activities of daily living and lives alone. She vacuums. She cooks. She does not drive. She denies any cardiovascular symptoms.  The patient denies any new symptoms such as chest discomfort, neck or arm discomfort. There has been no new shortness of breath, PND or orthopnea. There have been no reported palpitations, presyncope or syncope.  She does complain of burning in her feet and numbness.   Allergies  Allergen Reactions  . Codeine Other (See Comments)    Pt. does not remember    Current Outpatient Prescriptions  Medication Sig Dispense Refill  . aspirin 81 MG tablet Take 81 mg by mouth daily.    . Cholecalciferol (VITAMIN D3) 3000 UNITS TABS Take 2,000 Units by mouth.    . donepezil (ARICEPT) 10 MG tablet Take 10 mg by mouth at bedtime.     . fish oil-omega-3 fatty acids 1000 MG capsule Take 1 g by mouth daily.    . fluticasone (FLONASE) 50 MCG/ACT nasal spray Place 2 sprays into the nose daily.    Marland Kitchen MAGNESIUM PO Take 250 mg by mouth daily.     . metoprolol succinate (TOPROL-XL) 50 MG 24 hr tablet Take 1 tablet (50 mg total) by mouth daily. KEEP OV. 30 tablet 0  . pravastatin (PRAVACHOL) 80 MG tablet Take 80 mg by mouth daily.    . QUEtiapine (SEROQUEL) 25 MG tablet Take 25 mg by mouth at bedtime.    . Cyanocobalamin (VITAMIN B 12 PO) Take by mouth. Reported on 06/18/2015     No current facility-administered medications for this visit.    Past Medical History  Diagnosis Date  . Hypertension   . Hyperlipidemia   . SVT (supraventricular tachycardia) ([place])   . Interstitial cystitis     Past Surgical History  Procedure Laterality Date  . Abdominal hysterectomy    .  Cholecystectomy    . Revision urostomy cutaneous      ROS:     As stated in the HPI and negative for all other systems.  PHYSICAL EXAM BP 110/42 mmHg  Pulse 64  Ht 5\' 5"  (1.651 m)  Wt 127 lb (57.607 kg)  BMI 21.13 kg/m2 GENERAL:  Well appearing NECK:  No jugular venous distention, waveform within normal limits, carotid upstroke brisk and symmetric, left carotid bruit, no thyromegaly LUNGS:  Clear to auscultation bilaterally BACK:  No CVA tenderness, lordosis CHEST:  Unremarkable HEART:  PMI not displaced or sustained,S1 and S2 within normal limits, no S3, no S4, no clicks, no rubs, no murmurs ABD:  Flat, positive bowel sounds normal in frequency in pitch, no bruits, no rebound, no guarding, no midline pulsatile mass, no hepatomegaly, no splenomegaly, urostomy EXT:  2 plus pulses throughout, no edema, no cyanosis no clubbing  EKG:  Sinus rhythm, rate 56, axis within normal limits, intervals within normal limits, no acute ST-T wave changes. 06/18/2015  ASSESSMENT AND PLAN  SVT:  The patient has had no recurrent symptoms. No change in therapy is indicated. She will continue the beta blocker.  FOOT PAIN: I suspect that this is a neuropathy. I have suggested perhaps over-the-counter benfotiamine.  HTN:  The blood pressure is  at target. No change in medications is indicated. We will continue with therapeutic lifestyle changes (TLC).  BRUIT:  I will order carotid Doppler.

## 2015-06-18 ENCOUNTER — Encounter: Payer: Self-pay | Admitting: Cardiology

## 2015-06-18 ENCOUNTER — Ambulatory Visit (INDEPENDENT_AMBULATORY_CARE_PROVIDER_SITE_OTHER): Payer: Medicare Other | Admitting: Cardiology

## 2015-06-18 VITALS — BP 110/42 | HR 64 | Ht 65.0 in | Wt 127.0 lb

## 2015-06-18 DIAGNOSIS — I471 Supraventricular tachycardia: Secondary | ICD-10-CM | POA: Diagnosis not present

## 2015-06-18 DIAGNOSIS — R0989 Other specified symptoms and signs involving the circulatory and respiratory systems: Secondary | ICD-10-CM | POA: Diagnosis not present

## 2015-06-18 NOTE — Patient Instructions (Addendum)
Medication Instructions:  The current medical regimen is effective;  continue present plan and medications.  Benfotiamine - over the counter.  Testing/Procedures: Your physician has requested that you have a carotid duplex. This test is an ultrasound of the carotid arteries in your neck. It looks at blood flow through these arteries that supply the brain with blood. Allow one hour for this exam. There are no restrictions or special instructions.  This will be done at [facility].  Please check in at Radiology.  Follow-Up: Follow up as needed with Dr Percival Spanish.  If you need a refill on your cardiac medications before your next appointment, please call your pharmacy.  Thank you for choosing [facility]!!

## 2015-06-23 ENCOUNTER — Ambulatory Visit (HOSPITAL_COMMUNITY)
Admission: RE | Admit: 2015-06-23 | Discharge: 2015-06-23 | Disposition: A | Discharge status: Home / Self Care | Payer: Medicare Other | Source: Ambulatory Visit | Attending: Cardiology | Admitting: Cardiology

## 2015-06-23 ENCOUNTER — Ambulatory Visit (HOSPITAL_COMMUNITY): Admission: RE | Admit: 2015-06-23 | Payer: Medicare Other | Source: Ambulatory Visit

## 2015-06-23 ENCOUNTER — Ambulatory Visit (HOSPITAL_COMMUNITY): Payer: Medicare Other

## 2015-06-23 DIAGNOSIS — R0989 Other specified symptoms and signs involving the circulatory and respiratory systems: Secondary | ICD-10-CM | POA: Diagnosis not present

## 2015-06-23 DIAGNOSIS — I471 Supraventricular tachycardia: Secondary | ICD-10-CM

## 2015-06-23 DIAGNOSIS — I6523 Occlusion and stenosis of bilateral carotid arteries: Secondary | ICD-10-CM | POA: Diagnosis not present

## 2015-06-24 ENCOUNTER — Telehealth: Payer: Self-pay | Admitting: Cardiology

## 2015-06-24 ENCOUNTER — Telehealth: Payer: Self-pay | Admitting: *Deleted

## 2015-06-24 MED ORDER — METOPROLOL SUCCINATE ER 50 MG PO TB24: 50.0000 mg | ORAL_TABLET | Freq: Every day | ORAL | Status: DC

## 2015-06-24 NOTE — Telephone Encounter (Signed)
Pt gave ok to s/w daughter Shaleena Sillers. Both pt and her daughter have been notified of carotid results by phone. Per Dr. Percival Spanish to fax results to Dr. Edrick Oh (PCP).

## 2015-06-24 NOTE — Telephone Encounter (Signed)
I spoke with the pt and made her aware that Dr Percival Spanish did not stop this medication at her office visit.  The pt said she would need a new Rx sent to the pharmacy.  Rx sent.

## 2015-06-24 NOTE — Telephone Encounter (Signed)
New Message:   Pt wants to know if he wants her to continue taking Metoprolol?

## 2015-10-03 ENCOUNTER — Other Ambulatory Visit: Payer: Self-pay | Admitting: Urology

## 2015-10-03 DIAGNOSIS — N390 Urinary tract infection, site not specified: Secondary | ICD-10-CM

## 2015-10-08 ENCOUNTER — Ambulatory Visit (HOSPITAL_COMMUNITY)
Admission: RE | Admit: 2015-10-08 | Discharge: 2015-10-08 | Disposition: A | Discharge status: Home / Self Care | Payer: Medicare Other | Source: Ambulatory Visit | Attending: Urology | Admitting: Urology

## 2015-10-08 DIAGNOSIS — N281 Cyst of kidney, acquired: Secondary | ICD-10-CM | POA: Diagnosis not present

## 2015-10-08 DIAGNOSIS — N390 Urinary tract infection, site not specified: Secondary | ICD-10-CM | POA: Insufficient documentation

## 2015-10-10 ENCOUNTER — Ambulatory Visit (HOSPITAL_COMMUNITY): Payer: Medicare Other

## 2015-10-10 ENCOUNTER — Other Ambulatory Visit: Payer: Self-pay | Admitting: Urology

## 2015-10-10 ENCOUNTER — Other Ambulatory Visit (HOSPITAL_COMMUNITY)
Admission: RE | Admit: 2015-10-10 | Discharge: 2015-10-10 | Disposition: A | Discharge status: Home / Self Care | Payer: Medicare Other | Source: Other Acute Inpatient Hospital | Attending: Family Medicine | Admitting: Family Medicine

## 2015-10-10 ENCOUNTER — Ambulatory Visit (INDEPENDENT_AMBULATORY_CARE_PROVIDER_SITE_OTHER): Payer: Medicare Other | Admitting: Urology

## 2015-10-10 DIAGNOSIS — N3010 Interstitial cystitis (chronic) without hematuria: Secondary | ICD-10-CM

## 2015-10-10 DIAGNOSIS — N99524 Stenosis of incontinent stoma of urinary tract: Secondary | ICD-10-CM

## 2015-10-10 DIAGNOSIS — Z936 Other artificial openings of urinary tract status: Secondary | ICD-10-CM | POA: Diagnosis not present

## 2015-10-10 DIAGNOSIS — N281 Cyst of kidney, acquired: Secondary | ICD-10-CM | POA: Diagnosis not present

## 2015-10-10 LAB — URINE MICROSCOPIC-ADD ON

## 2015-10-10 LAB — URINALYSIS, ROUTINE W REFLEX MICROSCOPIC
Bilirubin Urine: NEGATIVE
Glucose, UA: NEGATIVE mg/dL
Ketones, ur: NEGATIVE mg/dL
Nitrite: POSITIVE — AB
Protein, ur: NEGATIVE mg/dL
Specific Gravity, Urine: 1.015 (ref 1.005–1.030)
pH: 7.5 (ref 5.0–8.0)

## 2015-10-16 ENCOUNTER — Ambulatory Visit (HOSPITAL_COMMUNITY)
Admission: RE | Admit: 2015-10-16 | Discharge: 2015-10-16 | Disposition: A | Discharge status: Home / Self Care | Payer: Medicare Other | Source: Ambulatory Visit | Attending: Urology | Admitting: Urology

## 2015-10-16 DIAGNOSIS — K439 Ventral hernia without obstruction or gangrene: Secondary | ICD-10-CM | POA: Insufficient documentation

## 2015-10-16 DIAGNOSIS — I2510 Atherosclerotic heart disease of native coronary artery without angina pectoris: Secondary | ICD-10-CM | POA: Diagnosis not present

## 2015-10-16 DIAGNOSIS — N3010 Interstitial cystitis (chronic) without hematuria: Secondary | ICD-10-CM | POA: Insufficient documentation

## 2015-10-16 DIAGNOSIS — N281 Cyst of kidney, acquired: Secondary | ICD-10-CM | POA: Insufficient documentation

## 2015-10-16 DIAGNOSIS — N200 Calculus of kidney: Secondary | ICD-10-CM | POA: Insufficient documentation

## 2015-10-16 DIAGNOSIS — Z906 Acquired absence of other parts of urinary tract: Secondary | ICD-10-CM | POA: Insufficient documentation

## 2015-10-16 DIAGNOSIS — I700 Atherosclerosis of aorta: Secondary | ICD-10-CM | POA: Diagnosis not present

## 2015-10-16 LAB — POCT I-STAT CREATININE: Creatinine, Ser: 0.8 mg/dL (ref 0.44–1.00)

## 2015-10-16 MED ORDER — IOPAMIDOL (ISOVUE-300) INJECTION 61%
125.0000 mL | Freq: Once | INTRAVENOUS | Status: AC | PRN
  Administered 2015-10-16: 125 mL via INTRAVENOUS

## 2015-12-13 ENCOUNTER — Ambulatory Visit (INDEPENDENT_AMBULATORY_CARE_PROVIDER_SITE_OTHER): Payer: Medicare Other

## 2015-12-13 DIAGNOSIS — Z23 Encounter for immunization: Secondary | ICD-10-CM | POA: Diagnosis not present

## 2016-05-06 DIAGNOSIS — E785 Hyperlipidemia, unspecified: Secondary | ICD-10-CM | POA: Diagnosis not present

## 2016-05-06 DIAGNOSIS — I10 Essential (primary) hypertension: Secondary | ICD-10-CM | POA: Diagnosis not present

## 2016-05-06 DIAGNOSIS — R252 Cramp and spasm: Secondary | ICD-10-CM | POA: Diagnosis not present

## 2016-05-06 DIAGNOSIS — R413 Other amnesia: Secondary | ICD-10-CM | POA: Diagnosis not present

## 2016-05-06 DIAGNOSIS — Z23 Encounter for immunization: Secondary | ICD-10-CM | POA: Diagnosis not present

## 2016-05-06 DIAGNOSIS — Z8679 Personal history of other diseases of the circulatory system: Secondary | ICD-10-CM | POA: Diagnosis not present

## 2016-05-06 DIAGNOSIS — Z936 Other artificial openings of urinary tract status: Secondary | ICD-10-CM | POA: Diagnosis not present

## 2016-05-10 DIAGNOSIS — I10 Essential (primary) hypertension: Secondary | ICD-10-CM | POA: Diagnosis not present

## 2016-05-10 DIAGNOSIS — R531 Weakness: Secondary | ICD-10-CM | POA: Diagnosis not present

## 2016-05-10 DIAGNOSIS — N390 Urinary tract infection, site not specified: Secondary | ICD-10-CM | POA: Diagnosis not present

## 2016-05-10 DIAGNOSIS — R200 Anesthesia of skin: Secondary | ICD-10-CM | POA: Diagnosis not present

## 2016-05-10 DIAGNOSIS — R259 Unspecified abnormal involuntary movements: Secondary | ICD-10-CM | POA: Diagnosis not present

## 2016-05-10 DIAGNOSIS — I6789 Other cerebrovascular disease: Secondary | ICD-10-CM | POA: Diagnosis not present

## 2016-05-10 DIAGNOSIS — R9431 Abnormal electrocardiogram [ECG] [EKG]: Secondary | ICD-10-CM | POA: Diagnosis not present

## 2016-05-10 DIAGNOSIS — R319 Hematuria, unspecified: Secondary | ICD-10-CM | POA: Diagnosis not present

## 2016-05-10 DIAGNOSIS — Z936 Other artificial openings of urinary tract status: Secondary | ICD-10-CM | POA: Diagnosis not present

## 2016-05-10 DIAGNOSIS — R69 Illness, unspecified: Secondary | ICD-10-CM | POA: Diagnosis not present

## 2016-05-10 DIAGNOSIS — R251 Tremor, unspecified: Secondary | ICD-10-CM | POA: Diagnosis not present

## 2016-05-18 DIAGNOSIS — I10 Essential (primary) hypertension: Secondary | ICD-10-CM | POA: Diagnosis not present

## 2016-05-18 DIAGNOSIS — Z936 Other artificial openings of urinary tract status: Secondary | ICD-10-CM | POA: Diagnosis not present

## 2016-05-18 DIAGNOSIS — N390 Urinary tract infection, site not specified: Secondary | ICD-10-CM | POA: Diagnosis not present

## 2016-05-18 DIAGNOSIS — E785 Hyperlipidemia, unspecified: Secondary | ICD-10-CM | POA: Diagnosis not present

## 2016-05-27 DIAGNOSIS — N3000 Acute cystitis without hematuria: Secondary | ICD-10-CM | POA: Diagnosis not present

## 2016-06-24 DIAGNOSIS — Z936 Other artificial openings of urinary tract status: Secondary | ICD-10-CM | POA: Diagnosis not present

## 2016-07-05 DIAGNOSIS — E785 Hyperlipidemia, unspecified: Secondary | ICD-10-CM | POA: Diagnosis not present

## 2016-07-05 DIAGNOSIS — G629 Polyneuropathy, unspecified: Secondary | ICD-10-CM | POA: Diagnosis not present

## 2016-07-05 DIAGNOSIS — R5383 Other fatigue: Secondary | ICD-10-CM | POA: Diagnosis not present

## 2016-07-05 DIAGNOSIS — I10 Essential (primary) hypertension: Secondary | ICD-10-CM | POA: Diagnosis not present

## 2016-07-23 DIAGNOSIS — Z936 Other artificial openings of urinary tract status: Secondary | ICD-10-CM | POA: Diagnosis not present

## 2016-07-26 ENCOUNTER — Other Ambulatory Visit: Payer: Self-pay | Admitting: Cardiology

## 2016-08-25 DIAGNOSIS — Z936 Other artificial openings of urinary tract status: Secondary | ICD-10-CM | POA: Diagnosis not present

## 2016-08-31 ENCOUNTER — Other Ambulatory Visit: Payer: Self-pay | Admitting: Urology

## 2016-08-31 DIAGNOSIS — N281 Cyst of kidney, acquired: Secondary | ICD-10-CM

## 2016-09-06 DIAGNOSIS — K5900 Constipation, unspecified: Secondary | ICD-10-CM | POA: Diagnosis not present

## 2016-09-06 DIAGNOSIS — G3184 Mild cognitive impairment, so stated: Secondary | ICD-10-CM | POA: Diagnosis not present

## 2016-09-06 DIAGNOSIS — Z7982 Long term (current) use of aspirin: Secondary | ICD-10-CM | POA: Diagnosis not present

## 2016-09-06 DIAGNOSIS — I10 Essential (primary) hypertension: Secondary | ICD-10-CM | POA: Diagnosis not present

## 2016-09-06 DIAGNOSIS — N399 Disorder of urinary system, unspecified: Secondary | ICD-10-CM | POA: Diagnosis not present

## 2016-09-06 DIAGNOSIS — Z6822 Body mass index (BMI) 22.0-22.9, adult: Secondary | ICD-10-CM | POA: Diagnosis not present

## 2016-09-06 DIAGNOSIS — Z0000 Encounter for general adult medical examination without abnormal findings: Secondary | ICD-10-CM | POA: Diagnosis not present

## 2016-09-07 ENCOUNTER — Ambulatory Visit (HOSPITAL_COMMUNITY)
Admission: RE | Admit: 2016-09-07 | Discharge: 2016-09-07 | Disposition: A | Discharge status: Home / Self Care | Payer: Medicare HMO | Source: Ambulatory Visit | Attending: Urology | Admitting: Urology

## 2016-09-07 DIAGNOSIS — N200 Calculus of kidney: Secondary | ICD-10-CM | POA: Diagnosis not present

## 2016-09-07 DIAGNOSIS — N281 Cyst of kidney, acquired: Secondary | ICD-10-CM | POA: Diagnosis not present

## 2016-11-25 DIAGNOSIS — I10 Essential (primary) hypertension: Secondary | ICD-10-CM | POA: Diagnosis not present

## 2016-11-25 DIAGNOSIS — E785 Hyperlipidemia, unspecified: Secondary | ICD-10-CM | POA: Diagnosis not present

## 2016-11-25 DIAGNOSIS — Z23 Encounter for immunization: Secondary | ICD-10-CM | POA: Diagnosis not present

## 2016-11-29 DIAGNOSIS — N390 Urinary tract infection, site not specified: Secondary | ICD-10-CM | POA: Diagnosis not present

## 2016-11-29 DIAGNOSIS — I10 Essential (primary) hypertension: Secondary | ICD-10-CM | POA: Diagnosis not present

## 2016-12-09 ENCOUNTER — Other Ambulatory Visit: Payer: Self-pay | Admitting: Cardiology

## 2016-12-20 DIAGNOSIS — I10 Essential (primary) hypertension: Secondary | ICD-10-CM | POA: Diagnosis not present

## 2016-12-20 DIAGNOSIS — E785 Hyperlipidemia, unspecified: Secondary | ICD-10-CM | POA: Diagnosis not present

## 2016-12-20 DIAGNOSIS — N390 Urinary tract infection, site not specified: Secondary | ICD-10-CM | POA: Diagnosis not present

## 2017-02-10 DIAGNOSIS — I10 Essential (primary) hypertension: Secondary | ICD-10-CM | POA: Diagnosis not present

## 2017-02-10 DIAGNOSIS — J0100 Acute maxillary sinusitis, unspecified: Secondary | ICD-10-CM | POA: Diagnosis not present

## 2017-03-22 DIAGNOSIS — R799 Abnormal finding of blood chemistry, unspecified: Secondary | ICD-10-CM | POA: Diagnosis not present

## 2017-03-22 DIAGNOSIS — Z936 Other artificial openings of urinary tract status: Secondary | ICD-10-CM | POA: Diagnosis not present

## 2017-03-22 DIAGNOSIS — Z0000 Encounter for general adult medical examination without abnormal findings: Secondary | ICD-10-CM | POA: Diagnosis not present

## 2017-03-22 DIAGNOSIS — I10 Essential (primary) hypertension: Secondary | ICD-10-CM | POA: Diagnosis not present

## 2017-03-22 DIAGNOSIS — R413 Other amnesia: Secondary | ICD-10-CM | POA: Diagnosis not present

## 2017-03-22 DIAGNOSIS — E785 Hyperlipidemia, unspecified: Secondary | ICD-10-CM | POA: Diagnosis not present

## 2017-03-24 ENCOUNTER — Other Ambulatory Visit: Payer: Self-pay | Admitting: Cardiology

## 2017-03-25 MED ORDER — METOPROLOL SUCCINATE ER 50 MG PO TB24: ORAL_TABLET | ORAL | 0 refills | Status: DC

## 2017-03-25 NOTE — Telephone Encounter (Signed)
°*  STAT* If patient is at the pharmacy, call can be transferred to refill team.   1. Which medications need to be refilled? (please list name of each medication and dose if known) need enough medicine until her appt on 04-14-17-Metoprolol  2. Which pharmacy/location (including street and city if local pharmacy) is medication to be sent to?[facility] -[phone]  3. Do they need a 30 day or 90 day supply? Enough until her appt on 04-14-17

## 2017-03-29 ENCOUNTER — Encounter: Payer: Self-pay | Admitting: Cardiology

## 2017-04-08 DIAGNOSIS — Z936 Other artificial openings of urinary tract status: Secondary | ICD-10-CM | POA: Diagnosis not present

## 2017-04-12 DIAGNOSIS — N3001 Acute cystitis with hematuria: Secondary | ICD-10-CM | POA: Diagnosis not present

## 2017-04-12 DIAGNOSIS — E785 Hyperlipidemia, unspecified: Secondary | ICD-10-CM | POA: Diagnosis not present

## 2017-04-12 DIAGNOSIS — I10 Essential (primary) hypertension: Secondary | ICD-10-CM | POA: Diagnosis not present

## 2017-04-14 ENCOUNTER — Ambulatory Visit: Payer: Medicare HMO | Admitting: Cardiology

## 2017-04-19 DIAGNOSIS — R413 Other amnesia: Secondary | ICD-10-CM | POA: Diagnosis not present

## 2017-04-19 DIAGNOSIS — R69 Illness, unspecified: Secondary | ICD-10-CM | POA: Diagnosis not present

## 2017-05-18 ENCOUNTER — Ambulatory Visit: Payer: Medicare HMO | Admitting: Cardiology

## 2017-07-06 DIAGNOSIS — Z936 Other artificial openings of urinary tract status: Secondary | ICD-10-CM | POA: Diagnosis not present

## 2017-07-26 DIAGNOSIS — R350 Frequency of micturition: Secondary | ICD-10-CM | POA: Diagnosis not present

## 2017-07-26 DIAGNOSIS — I10 Essential (primary) hypertension: Secondary | ICD-10-CM | POA: Diagnosis not present

## 2017-07-26 DIAGNOSIS — R69 Illness, unspecified: Secondary | ICD-10-CM | POA: Diagnosis not present

## 2017-07-26 DIAGNOSIS — E785 Hyperlipidemia, unspecified: Secondary | ICD-10-CM | POA: Diagnosis not present

## 2017-07-26 DIAGNOSIS — R300 Dysuria: Secondary | ICD-10-CM | POA: Diagnosis not present

## 2017-07-26 DIAGNOSIS — Z9350 Unspecified cystostomy status: Secondary | ICD-10-CM | POA: Diagnosis not present

## 2017-07-26 DIAGNOSIS — N390 Urinary tract infection, site not specified: Secondary | ICD-10-CM | POA: Diagnosis not present

## 2017-08-12 DIAGNOSIS — Z936 Other artificial openings of urinary tract status: Secondary | ICD-10-CM | POA: Diagnosis not present

## 2017-09-16 ENCOUNTER — Ambulatory Visit (INDEPENDENT_AMBULATORY_CARE_PROVIDER_SITE_OTHER): Payer: Medicare HMO | Admitting: Urology

## 2017-09-16 DIAGNOSIS — N3021 Other chronic cystitis with hematuria: Secondary | ICD-10-CM

## 2017-09-21 ENCOUNTER — Other Ambulatory Visit: Payer: Self-pay | Admitting: Urology

## 2017-09-21 DIAGNOSIS — N3021 Other chronic cystitis with hematuria: Secondary | ICD-10-CM

## 2017-09-29 DIAGNOSIS — Z936 Other artificial openings of urinary tract status: Secondary | ICD-10-CM | POA: Diagnosis not present

## 2017-10-11 ENCOUNTER — Ambulatory Visit (HOSPITAL_COMMUNITY)
Admission: RE | Admit: 2017-10-11 | Discharge: 2017-10-11 | Disposition: A | Discharge status: Home / Self Care | Payer: Medicare HMO | Source: Ambulatory Visit | Attending: Urology | Admitting: Urology

## 2017-10-11 DIAGNOSIS — Z90710 Acquired absence of both cervix and uterus: Secondary | ICD-10-CM | POA: Diagnosis not present

## 2017-10-11 DIAGNOSIS — N281 Cyst of kidney, acquired: Secondary | ICD-10-CM | POA: Insufficient documentation

## 2017-10-11 DIAGNOSIS — Z906 Acquired absence of other parts of urinary tract: Secondary | ICD-10-CM | POA: Diagnosis not present

## 2017-10-11 DIAGNOSIS — N200 Calculus of kidney: Secondary | ICD-10-CM | POA: Insufficient documentation

## 2017-10-11 DIAGNOSIS — N3021 Other chronic cystitis with hematuria: Secondary | ICD-10-CM | POA: Diagnosis not present

## 2017-10-11 DIAGNOSIS — K469 Unspecified abdominal hernia without obstruction or gangrene: Secondary | ICD-10-CM | POA: Insufficient documentation

## 2017-10-28 ENCOUNTER — Ambulatory Visit: Payer: Medicare HMO | Admitting: Urology

## 2017-11-21 DIAGNOSIS — R319 Hematuria, unspecified: Secondary | ICD-10-CM | POA: Diagnosis not present

## 2017-11-21 DIAGNOSIS — L750 Bromhidrosis: Secondary | ICD-10-CM | POA: Diagnosis not present

## 2017-11-21 DIAGNOSIS — N390 Urinary tract infection, site not specified: Secondary | ICD-10-CM | POA: Diagnosis not present

## 2017-11-23 DIAGNOSIS — I10 Essential (primary) hypertension: Secondary | ICD-10-CM | POA: Diagnosis not present

## 2017-11-23 DIAGNOSIS — E785 Hyperlipidemia, unspecified: Secondary | ICD-10-CM | POA: Diagnosis not present

## 2017-12-05 DIAGNOSIS — E785 Hyperlipidemia, unspecified: Secondary | ICD-10-CM | POA: Diagnosis not present

## 2017-12-05 DIAGNOSIS — R350 Frequency of micturition: Secondary | ICD-10-CM | POA: Diagnosis not present

## 2017-12-05 DIAGNOSIS — R69 Illness, unspecified: Secondary | ICD-10-CM | POA: Diagnosis not present

## 2017-12-05 DIAGNOSIS — Z9350 Unspecified cystostomy status: Secondary | ICD-10-CM | POA: Diagnosis not present

## 2017-12-05 DIAGNOSIS — I10 Essential (primary) hypertension: Secondary | ICD-10-CM | POA: Diagnosis not present

## 2017-12-16 ENCOUNTER — Ambulatory Visit: Payer: Medicare HMO | Admitting: Urology

## 2017-12-16 DIAGNOSIS — N281 Cyst of kidney, acquired: Secondary | ICD-10-CM | POA: Diagnosis not present

## 2017-12-16 DIAGNOSIS — N3021 Other chronic cystitis with hematuria: Secondary | ICD-10-CM

## 2017-12-16 DIAGNOSIS — N99524 Stenosis of incontinent stoma of urinary tract: Secondary | ICD-10-CM

## 2017-12-16 DIAGNOSIS — N200 Calculus of kidney: Secondary | ICD-10-CM | POA: Diagnosis not present

## 2017-12-20 DIAGNOSIS — R42 Dizziness and giddiness: Secondary | ICD-10-CM | POA: Diagnosis not present

## 2017-12-20 DIAGNOSIS — Z23 Encounter for immunization: Secondary | ICD-10-CM | POA: Diagnosis not present

## 2017-12-29 DIAGNOSIS — Z936 Other artificial openings of urinary tract status: Secondary | ICD-10-CM | POA: Diagnosis not present

## 2018-03-17 DIAGNOSIS — Z936 Other artificial openings of urinary tract status: Secondary | ICD-10-CM | POA: Diagnosis not present

## 2018-03-20 DIAGNOSIS — Z936 Other artificial openings of urinary tract status: Secondary | ICD-10-CM | POA: Diagnosis not present

## 2018-08-01 DIAGNOSIS — Z936 Other artificial openings of urinary tract status: Secondary | ICD-10-CM | POA: Diagnosis not present

## 2018-09-11 ENCOUNTER — Other Ambulatory Visit: Payer: Self-pay

## 2018-09-13 ENCOUNTER — Other Ambulatory Visit: Payer: Self-pay

## 2018-09-13 ENCOUNTER — Ambulatory Visit (INDEPENDENT_AMBULATORY_CARE_PROVIDER_SITE_OTHER): Payer: Medicare HMO | Admitting: Family Medicine

## 2018-09-13 ENCOUNTER — Encounter: Payer: Self-pay | Admitting: Family Medicine

## 2018-09-13 VITALS — BP 152/71 | HR 70 | Temp 97.0°F | Ht 65.0 in | Wt 144.0 lb

## 2018-09-13 DIAGNOSIS — N3090 Cystitis, unspecified without hematuria: Secondary | ICD-10-CM

## 2018-09-13 DIAGNOSIS — N3010 Interstitial cystitis (chronic) without hematuria: Secondary | ICD-10-CM | POA: Diagnosis not present

## 2018-09-13 DIAGNOSIS — G301 Alzheimer's disease with late onset: Secondary | ICD-10-CM

## 2018-09-13 DIAGNOSIS — R69 Illness, unspecified: Secondary | ICD-10-CM | POA: Diagnosis not present

## 2018-09-13 DIAGNOSIS — F0280 Dementia in other diseases classified elsewhere without behavioral disturbance: Secondary | ICD-10-CM

## 2018-09-13 LAB — MICROSCOPIC EXAMINATION
RBC: >30 /hpf — AB (ref 0–2)
Renal Epithel, UA: NONE SEEN /hpf
WBC, UA: >30 /hpf — AB (ref 0–5)

## 2018-09-13 LAB — URINALYSIS, COMPLETE
Bilirubin, UA: NEGATIVE
Glucose, UA: NEGATIVE
Ketones, UA: NEGATIVE
Nitrite, UA: POSITIVE — AB
Specific Gravity, UA: 1.01 (ref 1.005–1.030)
Urobilinogen, Ur: 1 mg/dL (ref 0.2–1.0)
pH, UA: >9 — ABNORMAL HIGH (ref 5.0–7.5)

## 2018-09-13 MED ORDER — RIVASTIGMINE 4.6 MG/24HR TD PT24: 4.6000 mg | MEDICATED_PATCH | Freq: Every day | TRANSDERMAL | 2 refills | Status: DC

## 2018-09-13 MED ORDER — SULFAMETHOXAZOLE-TRIMETHOPRIM 800-160 MG PO TABS: 1.0000 | ORAL_TABLET | Freq: Two times a day (BID) | ORAL | 0 refills | Status: DC

## 2018-09-13 NOTE — Progress Notes (Signed)
Subjective:  Patient ID: Theresa Hunt, female    DOB: 04/01/1929  Age: 85 y.o. MRN: [id number]  CC: [place] presents for new patient office visit.  She has been followed in the past by Dr. Edrick Oh.  She could not tolerate Aricept in the past but her son says she has some dementia.  He is concerned that it is getting worse.  He is the major historian today she does agree with him as we discussed her care.  He is concerned that her memory is getting worse particularly in the short-term.  The patient has had a bladder repair and wears a ureterostomy.  She has had frequent bladder infections.  The son wonders if this is part of her dementia pattern.   MMSE - Mini Mental State Exam 09/13/2018  Orientation to time 2  Orientation to Place 5  Registration 3  Attention/ Calculation 5  Recall 0  Language- name 2 objects 2  Language- repeat 1  Language- follow 3 step command 3  Language- read & follow direction 1  Write a sentence 1  Copy design 1  Total score 24     Depression screen PHQ 2/9 09/13/2018  Decreased Interest 0  Down, Depressed, Hopeless 0  PHQ - 2 Score 0    History Sahvanna has a past medical history of Hyperlipidemia, Hypertension, Interstitial cystitis, and SVT (supraventricular tachycardia) ([place]).   She has a past surgical history that includes Abdominal hysterectomy; Cholecystectomy; and Revision urostomy cutaneous.   Her family history includes CAD (age of onset: 80) in her mother; Cancer in her daughter; Emphysema in her father.She reports that she has never smoked. She has never used smokeless tobacco. She reports that she does not drink alcohol or use drugs.    ROS Review of Systems  Constitutional: Negative.   HENT: Negative for congestion.   Eyes: Negative for visual disturbance.  Respiratory: Negative for shortness of breath.   Cardiovascular: Negative for chest pain.  Gastrointestinal: Negative for abdominal pain, constipation,  diarrhea, nausea and vomiting.  Genitourinary: Negative for difficulty urinating.  Musculoskeletal: Negative for arthralgias and myalgias.  Neurological: Negative for headaches.  Psychiatric/Behavioral: Negative for sleep disturbance.    Objective:  BP (!) 152/71   Pulse 70   Temp (!) 97 F (36.1 C) (Oral)   Ht 5\' 5"  (1.651 m)   Wt 144 lb (65.3 kg)   BMI 23.96 kg/m   BP Readings from Last 3 Encounters:  09/13/18 (!) 152/71  06/18/15 (!) 110/42  12/06/12 (!) 147/69    Wt Readings from Last 3 Encounters:  09/13/18 144 lb (65.3 kg)  06/18/15 127 lb (57.6 kg)  12/06/12 152 lb (68.9 kg)     Physical Exam Constitutional:      General: She is not in acute distress.    Appearance: She is well-developed.  HENT:     Head: Normocephalic and atraumatic.     Right Ear: External ear normal.     Left Ear: External ear normal.     Nose: Nose normal.  Eyes:     Conjunctiva/sclera: Conjunctivae normal.     Pupils: Pupils are equal, round, and reactive to light.  Neck:     Musculoskeletal: Normal range of motion and neck supple.     Thyroid: No thyromegaly.  Cardiovascular:     Rate and Rhythm: Normal rate and regular rhythm.     Heart sounds: Normal heart sounds. No murmur.  Pulmonary:  Effort: Pulmonary effort is normal. No respiratory distress.     Breath sounds: Normal breath sounds. No wheezing or rales.  Abdominal:     General: Bowel sounds are normal. There is no distension.     Palpations: Abdomen is soft.     Tenderness: There is no abdominal tenderness.  Lymphadenopathy:     Cervical: No cervical adenopathy.  Skin:    General: Skin is warm and dry.  Neurological:     Mental Status: She is alert and oriented to person, place, and time.     Deep Tendon Reflexes: Reflexes are normal and symmetric.  Psychiatric:        Behavior: Behavior normal.        Thought Content: Thought content normal.        Judgment: Judgment normal.     Results for orders placed or  performed in visit on 09/13/18  Microscopic Examination   URINE  Result Value Ref Range   WBC, UA >30 (A) 0 - 5 /hpf   RBC >30 (A) 0 - 2 /hpf   Epithelial Cells (non renal) 0-10 0 - 10 /hpf   Renal Epithel, UA None seen None seen /hpf   Bacteria, UA Many (A) None seen/Few  Urinalysis, Complete  Result Value Ref Range   Specific Gravity, UA 1.010 1.005 - 1.030   pH, UA >9.0 (H) 5.0 - 7.5   Color, UA Amber (A) Yellow   Appearance Ur Cloudy (A) Clear   Leukocytes,UA 3+ (A) Negative   Protein,UA 2+ (A) Negative/Trace   Glucose, UA Negative Negative   Ketones, UA Negative Negative   RBC, UA 3+ (A) Negative   Bilirubin, UA Negative Negative   Urobilinogen, Ur 1.0 0.2 - 1.0 mg/dL   Nitrite, UA Positive (A) Negative   Microscopic Examination See below:      Assessment & Plan:   Manaia was seen today for establish care.  Diagnoses and all orders for this visit:  Interstitial cystitis -     Urinalysis, Complete -     Urine Culture  Late onset Alzheimer's disease without behavioral disturbance (HCC)  Cystitis  Other orders -     rivastigmine (EXELON) 4.6 mg/24hr; Place 1 patch (4.6 mg total) onto the skin daily. -     Microscopic Examination -     sulfamethoxazole-trimethoprim (BACTRIM DS) 800-160 MG tablet; Take 1 tablet by mouth 2 (two) times daily.       I have discontinued Franne Grip. Lichtenwalner's pravastatin, donepezil, and QUEtiapine. I am also having her start on rivastigmine and sulfamethoxazole-trimethoprim. Additionally, I am having her maintain her MAGNESIUM PO, Cyanocobalamin (VITAMIN B 12 PO), aspirin, fish oil-omega-3 fatty acids, Vitamin D3, fluticasone, and metoprolol succinate.  Allergies as of 09/13/2018      Reactions   Codeine Other (See Comments)   Pt. does not remember      Medication List       Accurate as of September 13, 2018  7:14 PM. If you have any questions, ask your nurse or doctor.        STOP taking these medications   Aricept 10 MG tablet Generic  drug: donepezil Stopped by: [name], MD   pravastatin 80 MG tablet Commonly known as: PRAVACHOL Stopped by: [name], MD   QUEtiapine 25 MG tablet Commonly known as: SEROQUEL Stopped by: [name], MD     TAKE these medications   aspirin 81 MG tablet Take 81 mg by mouth daily.   fish oil-omega-3 fatty  acids 1000 MG capsule Take 1 g by mouth daily.   fluticasone 50 MCG/ACT nasal spray Commonly known as: FLONASE Place 2 sprays into the nose daily.   MAGNESIUM PO Take 250 mg by mouth daily.   metoprolol succinate 50 MG 24 hr tablet Commonly known as: TOPROL-XL TAKE ONE (1) TABLET EACH DAY   rivastigmine 4.6 mg/24hr Commonly known as: Exelon Place 1 patch (4.6 mg total) onto the skin daily. Started by: [name], MD   sulfamethoxazole-trimethoprim 800-160 MG tablet Commonly known as: BACTRIM DS Take 1 tablet by mouth 2 (two) times daily. Started by: [name], MD   VITAMIN B 12 PO Take by mouth. Reported on 06/18/2015   Vitamin D3 75 MCG (3000 UT) Tabs Take 2,000 Units by mouth.        Follow-up: No follow-ups on file.  [name], M.D.

## 2018-09-14 ENCOUNTER — Telehealth: Payer: Self-pay | Admitting: Family Medicine

## 2018-09-14 NOTE — Telephone Encounter (Signed)
Please review and advise.

## 2018-09-15 ENCOUNTER — Other Ambulatory Visit: Payer: Self-pay | Admitting: Family Medicine

## 2018-09-15 MED ORDER — RIVASTIGMINE TARTRATE 3 MG PO CAPS: 3.0000 mg | ORAL_CAPSULE | Freq: Two times a day (BID) | ORAL | 2 refills | Status: DC

## 2018-09-15 NOTE — Telephone Encounter (Signed)
I sent in the requested prescription 

## 2018-09-15 NOTE — Progress Notes (Signed)
e

## 2018-09-17 ENCOUNTER — Other Ambulatory Visit: Payer: Self-pay | Admitting: Family Medicine

## 2018-09-17 LAB — URINE CULTURE

## 2018-09-17 MED ORDER — NITROFURANTOIN MONOHYD MACRO 100 MG PO CAPS: 100.0000 mg | ORAL_CAPSULE | Freq: Two times a day (BID) | ORAL | 0 refills | Status: DC

## 2018-10-02 ENCOUNTER — Other Ambulatory Visit: Payer: Self-pay

## 2018-10-02 ENCOUNTER — Encounter: Payer: Self-pay | Admitting: Family

## 2018-10-02 ENCOUNTER — Ambulatory Visit (INDEPENDENT_AMBULATORY_CARE_PROVIDER_SITE_OTHER): Payer: Medicare HMO | Admitting: Family

## 2018-10-02 ENCOUNTER — Telehealth: Payer: Self-pay | Admitting: Family Medicine

## 2018-10-02 VITALS — BP 148/73 | HR 69 | Temp 97.5°F | Ht 65.0 in | Wt 142.0 lb

## 2018-10-02 DIAGNOSIS — F0390 Unspecified dementia without behavioral disturbance: Secondary | ICD-10-CM | POA: Diagnosis not present

## 2018-10-02 DIAGNOSIS — R69 Illness, unspecified: Secondary | ICD-10-CM | POA: Diagnosis not present

## 2018-10-02 DIAGNOSIS — Z659 Problem related to unspecified psychosocial circumstances: Secondary | ICD-10-CM | POA: Diagnosis not present

## 2018-10-02 DIAGNOSIS — R419 Unspecified symptoms and signs involving cognitive functions and awareness: Secondary | ICD-10-CM

## 2018-10-02 DIAGNOSIS — Z9181 History of falling: Secondary | ICD-10-CM | POA: Diagnosis not present

## 2018-10-02 NOTE — Patient Instructions (Signed)
Dementia Dementia is a condition that affects the way the brain functions. It often affects memory and thinking. Usually, dementia gets worse with time and cannot be reversed (progressive dementia). There are many types of dementia, including:  Alzheimer's disease. This type is the most common.  Vascular dementia. This type may happen as the result of a stroke.  Lewy body dementia. This type may happen to people who have Parkinson's disease.  Frontotemporal dementia. This type is caused by damage to nerve cells (neurons) in certain parts of the brain. Some people may be affected by more than one type of dementia. This is called mixed dementia. What are the causes? Dementia is caused by damage to cells in the brain. The area of the brain and the types of cells damaged determine the type of dementia. Usually, this damage is irreversible or cannot be undone. Some examples of irreversible causes include:  Conditions that affect the blood vessels of the brain, such as diabetes, heart disease, or blood vessel disease.  Genetic mutations. In some cases, changes in the brain may be caused by another condition and can be reversed or slowed. Some examples of reversible causes include:  Injury to the brain.  Certain medicines.  Infection, such as meningitis.  Metabolic problems, such as vitamin B12 deficiency or thyroid disease.  Pressure on the brain, such as from a tumor or blood clot. What are the signs or symptoms? Symptoms of dementia depend on the type of dementia. Common signs of dementia include problems with remembering, thinking, problem solving, decision making, and communicating. These signs develop slowly or get worse with time. This may include:  Problems remembering things.  Having trouble taking a bath or putting clothes on.  Forgetting appointments.  Forgetting to pay bills.  Difficulty planning and preparing meals.  Having trouble speaking.  Getting lost easily. How  is this diagnosed? This condition is diagnosed by a specialist (neurologist). It is diagnosed based on the history of your symptoms, your medical history, a physical exam, and tests. Tests may include:  Tests to evaluate brain function, such as memory tests, cognitive tests, and other tests.  Lab tests, such as blood or urine tests.  Imaging tests, such as a CT scan, a PET scan, or an MRI.  Genetic testing. This may be done if other family members have a diagnosis of certain types of dementia. Your health care provider will talk with you and your family, friends, or caregivers about your history and symptoms. How is this treated?  Treatment for this condition depends on the cause of the dementia. Progressive dementias, such as Alzheimer's disease, cannot be cured, but there may be treatments that help to manage symptoms. Treatment might involve taking medicines that may help to:  Control the dementia.  Slow down the progression of the dementia.  Manage symptoms. In some cases, treating the cause of your dementia can improve symptoms, reverse symptoms, or slow down how quickly your dementia becomes worse. Your health care provider can direct you to support groups, organizations, and other health care providers who can help with decisions about your care. Follow these instructions at home: Medicines  Take over-the-counter and prescription medicines only as told by your health care provider.  Use a pill organizer or pill reminder to help you manage your medicines.  Avoid taking medicines that can affect thinking, such as pain medicines or sleeping medicines. Lifestyle  Make healthy lifestyle choices. ? Be physically active as told by your health care provider. ? Do   not use any products that contain nicotine or tobacco, such as cigarettes, e-cigarettes, and chewing tobacco. If you need help quitting, ask your health care provider. ? Do not drink alcohol. ? Practice stress-management  techniques when you get stressed. ? Spend time with other people.  Make sure to get quality sleep. These tips can help you get a good night's rest: ? Avoid napping during the day. ? Keep your sleeping area dark and cool. ? Avoid exercising during the few hours before you go to bed. ? Avoid caffeine products in the evening. Eating and drinking  Drink enough fluid to keep your urine pale yellow.  Eat a healthy diet. General instructions   Work with your health care provider to determine what you need help with and what your safety needs are.  Talk with your health care provider about whether it is safe for you to drive.  If you were given a bracelet that identifies you as a person with memory loss or tracks your location, make sure to wear it at all times.  Work with your family to make important decisions, such as advance directives, medical power of attorney, or a living will.  Keep all follow-up visits as told by your health care provider. This is important. Where to find more information  Alzheimer's Association: CapitalMile.co.nz  National Institute on Aging: DVDEnthusiasts.nl  World Health Organization: RoleLink.com.br Contact a health care provider if:  You have any new or worsening symptoms.  You have problems with choking or swallowing. Get help right away if:  You feel depressed or sad, or feel that you want to harm yourself.  Your family members become concerned for your safety. If you ever feel like you may hurt yourself or others, or have thoughts about taking your own life, get help right away. You can go to your nearest emergency department or call:  Your local emergency services (911 in the [place]  A suicide crisis helpline, such as the [facility] at 804-028-5794. This is open 24 hours a day. Summary  Dementia is a condition that affects the way the brain functions. Dementia often affects memory and thinking.  Usually,  dementia gets worse with time and cannot be reversed (progressive dementia).  Treatment for this condition depends on the cause of the dementia.  Work with your health care provider to determine what you need help with and what your safety needs are.  Your health care provider can direct you to support groups, organizations, and other health care providers who can help with decisions about your care. This information is not intended to replace advice given to you by your health care provider. Make sure you discuss any questions you have with your health care provider. Document Released: 08/25/2000 Document Revised: 05/16/2018 Document Reviewed: 05/16/2018 Elsevier Patient Education  2020 [employer].

## 2018-10-02 NOTE — Progress Notes (Signed)
Subjective:    Patient ID: Theresa Hunt, female    DOB: 27-May-1929, 85 y.o.   MRN: [id number] Chief Complaint  Patient presents with  . had a fall    injured arm,   dementia    HPI Pt presents to the office today with complaints of decreasing memory problems. She saw a neurologists about 3-4 years ago with memory difficulties and was tried on medication, but could not tolerate it and caused hallucinations.   Her daughter is present and states that last night at 9 pm last night she went outside to check her "oil" level and fell over. She pressed her Life Alert button and had to get police and EMS to come and get her.   Her daughter states her mother loves to go outside and she worries with the temperature of 100 degrees.   Her daughter has called [facility] and checked on prices.    Her Mini mental is 19 today.  Review of Systems  Constitutional: Positive for activity change.  Neurological: Positive for weakness.  All other systems reviewed and are negative.      Objective:   Physical Exam Vitals signs reviewed.  Constitutional:      General: She is not in acute distress.    Appearance: She is well-developed.  HENT:     Head: Normocephalic and atraumatic.     Right Ear: Tympanic membrane normal.     Left Ear: Tympanic membrane normal.  Eyes:     Pupils: Pupils are equal, round, and reactive to light.  Neck:     Musculoskeletal: Normal range of motion and neck supple.     Thyroid: No thyromegaly.  Cardiovascular:     Rate and Rhythm: Normal rate and regular rhythm.     Heart sounds: Normal heart sounds. No murmur.  Pulmonary:     Effort: Pulmonary effort is normal. No respiratory distress.     Breath sounds: Normal breath sounds. No wheezing.  Abdominal:     General: Bowel sounds are normal. There is no distension.     Palpations: Abdomen is soft.     Tenderness: There is no abdominal tenderness.  Musculoskeletal: Normal range of motion.        General: No  tenderness.  Skin:    General: Skin is warm and dry.     Findings: Abrasion present.       Neurological:     Mental Status: She is alert. She is disoriented.     Cranial Nerves: No cranial nerve deficit.     Deep Tendon Reflexes: Reflexes are normal and symmetric.  Psychiatric:        Behavior: Behavior normal.        Thought Content: Thought content normal.        Judgment: Judgment normal.       BP (!) 148/73   Pulse 69   Temp (!) 97.5 F (36.4 C) (Oral)   Ht 5\' 5"  (1.651 m)   Wt 142 lb (64.4 kg)   BMI 23.63 kg/m      Assessment & Plan:  Theresa Hunt comes in today with chief complaint of had a fall (injured arm,   dementia)   Diagnosis and orders addressed:  1. Dementia without behavioral disturbance, unspecified dementia type ([place])   2. At high risk for injury related to fall  3. Cognitive safety issue   FL2 form completed today to take to [place] is not safe living by herself Talked  with daughter and son, who agree. She will continue to use her Life Alert, but long discussion about fall preventions  Pt should not be using stove, driving, or going outside unattended.    Evelina Dun, FNP

## 2018-10-02 NOTE — Telephone Encounter (Signed)
Incoming call from pt's son stating pt had a fall last night Was evaluated by EMS and bandaged up Son feels that demetia is getting worse Recommended pt be evaluated if worsening confusion Declined appt Will call back to schedule if sxs persist or worsen

## 2018-10-02 NOTE — Telephone Encounter (Signed)
Agree with appt.  Will cc' PCP for follow up on this

## 2018-10-09 ENCOUNTER — Other Ambulatory Visit: Payer: Self-pay

## 2018-10-09 ENCOUNTER — Emergency Department (HOSPITAL_COMMUNITY): Payer: Medicare HMO

## 2018-10-09 ENCOUNTER — Encounter (HOSPITAL_COMMUNITY): Payer: Self-pay | Admitting: Emergency Medicine

## 2018-10-09 ENCOUNTER — Inpatient Hospital Stay (HOSPITAL_COMMUNITY)
Admission: EM | Admit: 2018-10-09 | Discharge: 2018-10-12 | DRG: 699 | Disposition: A | Discharge status: Home Health | Payer: Medicare HMO | Attending: Internal Medicine | Admitting: Internal Medicine

## 2018-10-09 DIAGNOSIS — R110 Nausea: Secondary | ICD-10-CM | POA: Diagnosis not present

## 2018-10-09 DIAGNOSIS — Z20828 Contact with and (suspected) exposure to other viral communicable diseases: Secondary | ICD-10-CM | POA: Diagnosis not present

## 2018-10-09 DIAGNOSIS — T83518A Infection and inflammatory reaction due to other urinary catheter, initial encounter: Principal | ICD-10-CM | POA: Diagnosis present

## 2018-10-09 DIAGNOSIS — E876 Hypokalemia: Secondary | ICD-10-CM | POA: Diagnosis not present

## 2018-10-09 DIAGNOSIS — F0390 Unspecified dementia without behavioral disturbance: Secondary | ICD-10-CM | POA: Diagnosis present

## 2018-10-09 DIAGNOSIS — R739 Hyperglycemia, unspecified: Secondary | ICD-10-CM | POA: Diagnosis not present

## 2018-10-09 DIAGNOSIS — Y833 Surgical operation with formation of external stoma as the cause of abnormal reaction of the patient, or of later complication, without mention of misadventure at the time of the procedure: Secondary | ICD-10-CM | POA: Diagnosis present

## 2018-10-09 DIAGNOSIS — R0902 Hypoxemia: Secondary | ICD-10-CM | POA: Diagnosis not present

## 2018-10-09 DIAGNOSIS — Z825 Family history of asthma and other chronic lower respiratory diseases: Secondary | ICD-10-CM | POA: Diagnosis not present

## 2018-10-09 DIAGNOSIS — R69 Illness, unspecified: Secondary | ICD-10-CM | POA: Diagnosis not present

## 2018-10-09 DIAGNOSIS — B9620 Unspecified Escherichia coli [E. coli] as the cause of diseases classified elsewhere: Secondary | ICD-10-CM | POA: Diagnosis present

## 2018-10-09 DIAGNOSIS — R531 Weakness: Secondary | ICD-10-CM | POA: Diagnosis not present

## 2018-10-09 DIAGNOSIS — N390 Urinary tract infection, site not specified: Secondary | ICD-10-CM | POA: Diagnosis present

## 2018-10-09 DIAGNOSIS — Z8249 Family history of ischemic heart disease and other diseases of the circulatory system: Secondary | ICD-10-CM

## 2018-10-09 DIAGNOSIS — Z936 Other artificial openings of urinary tract status: Secondary | ICD-10-CM | POA: Diagnosis not present

## 2018-10-09 DIAGNOSIS — W19XXXA Unspecified fall, initial encounter: Secondary | ICD-10-CM

## 2018-10-09 DIAGNOSIS — Z7982 Long term (current) use of aspirin: Secondary | ICD-10-CM

## 2018-10-09 DIAGNOSIS — R112 Nausea with vomiting, unspecified: Secondary | ICD-10-CM | POA: Diagnosis not present

## 2018-10-09 DIAGNOSIS — R5381 Other malaise: Secondary | ICD-10-CM | POA: Diagnosis not present

## 2018-10-09 DIAGNOSIS — I10 Essential (primary) hypertension: Secondary | ICD-10-CM | POA: Diagnosis present

## 2018-10-09 DIAGNOSIS — R000 Tachycardia, unspecified: Secondary | ICD-10-CM | POA: Diagnosis not present

## 2018-10-09 LAB — URINALYSIS, ROUTINE W REFLEX MICROSCOPIC
Bilirubin Urine: NEGATIVE
Glucose, UA: NEGATIVE mg/dL
Ketones, ur: NEGATIVE mg/dL
Nitrite: NEGATIVE
Protein, ur: 100 mg/dL — AB
RBC / HPF: >50 RBC/hpf — ABNORMAL HIGH (ref 0–5)
Specific Gravity, Urine: 1.008 (ref 1.005–1.030)
pH: 8 (ref 5.0–8.0)

## 2018-10-09 LAB — CBC WITH DIFFERENTIAL/PLATELET
Abs Immature Granulocytes: 0.05 10*3/uL (ref 0.00–0.07)
Basophils Absolute: 0 10*3/uL (ref 0.0–0.1)
Basophils Relative: 0 %
Eosinophils Absolute: 0 10*3/uL (ref 0.0–0.5)
Eosinophils Relative: 0 %
HCT: 41 % (ref 36.0–46.0)
Hemoglobin: 13.7 g/dL (ref 12.0–15.0)
Immature Granulocytes: 1 %
Lymphocytes Relative: 3 %
Lymphs Abs: 0.3 10*3/uL — ABNORMAL LOW (ref 0.7–4.0)
MCH: 30.2 pg (ref 26.0–34.0)
MCHC: 33.4 g/dL (ref 30.0–36.0)
MCV: 90.5 fL (ref 80.0–100.0)
Monocytes Absolute: 0.2 10*3/uL (ref 0.1–1.0)
Monocytes Relative: 2 %
Neutro Abs: 10.3 10*3/uL — ABNORMAL HIGH (ref 1.7–7.7)
Neutrophils Relative %: 94 %
Platelets: 119 10*3/uL — ABNORMAL LOW (ref 150–400)
RBC: 4.53 MIL/uL (ref 3.87–5.11)
RDW: 12.7 % (ref 11.5–15.5)
WBC: 10.9 10*3/uL — ABNORMAL HIGH (ref 4.0–10.5)
nRBC: 0 % (ref 0.0–0.2)

## 2018-10-09 LAB — COMPREHENSIVE METABOLIC PANEL
ALT: 31 U/L (ref 0–44)
AST: 40 U/L (ref 15–41)
Albumin: 3.6 g/dL (ref 3.5–5.0)
Alkaline Phosphatase: 96 U/L (ref 38–126)
Anion gap: 10 (ref 5–15)
BUN: 25 mg/dL — ABNORMAL HIGH (ref 8–23)
CO2: 23 mmol/L (ref 22–32)
Calcium: 9.7 mg/dL (ref 8.9–10.3)
Chloride: 100 mmol/L (ref 98–111)
Creatinine, Ser: 0.91 mg/dL (ref 0.44–1.00)
GFR calc Af Amer: >60 mL/min (ref >60)
GFR calc non Af Amer: 57 mL/min — ABNORMAL LOW (ref >60)
Glucose, Bld: 218 mg/dL — ABNORMAL HIGH (ref 70–99)
Potassium: 3 mmol/L — ABNORMAL LOW (ref 3.5–5.1)
Sodium: 133 mmol/L — ABNORMAL LOW (ref 135–145)
Total Bilirubin: 1.8 mg/dL — ABNORMAL HIGH (ref 0.3–1.2)
Total Protein: 6.7 g/dL (ref 6.5–8.1)

## 2018-10-09 LAB — LIPASE, BLOOD: Lipase: 22 U/L (ref 11–51)

## 2018-10-09 LAB — TROPONIN I (HIGH SENSITIVITY)
Troponin I (High Sensitivity): 21 ng/L — ABNORMAL HIGH (ref <18)
Troponin I (High Sensitivity): 22 ng/L — ABNORMAL HIGH (ref <18)

## 2018-10-09 LAB — SARS CORONAVIRUS 2 BY RT PCR (HOSPITAL ORDER, PERFORMED IN ~~LOC~~ HOSPITAL LAB): SARS Coronavirus 2: NEGATIVE

## 2018-10-09 MED ORDER — SODIUM CHLORIDE 0.9 % IV SOLN
1.0000 g | Freq: Once | INTRAVENOUS | Status: AC
  Administered 2018-10-09: 05:00:00 1 g via INTRAVENOUS
  Filled 2018-10-09: qty 10

## 2018-10-09 MED ORDER — ENOXAPARIN SODIUM 40 MG/0.4ML ~~LOC~~ SOLN
40.0000 mg | SUBCUTANEOUS | Status: DC
  Administered 2018-10-09 – 2018-10-12 (×4): 40 mg via SUBCUTANEOUS
  Filled 2018-10-09 (×4): qty 0.4

## 2018-10-09 MED ORDER — POTASSIUM CHLORIDE 10 MEQ/100ML IV SOLN
10.0000 meq | INTRAVENOUS | Status: AC
  Administered 2018-10-09 (×2): 10 meq via INTRAVENOUS
  Filled 2018-10-09 (×2): qty 100

## 2018-10-09 MED ORDER — SODIUM CHLORIDE 0.9 % IV SOLN
INTRAVENOUS | Status: DC
  Administered 2018-10-09 – 2018-10-10 (×3): via INTRAVENOUS

## 2018-10-09 MED ORDER — ACETAMINOPHEN 650 MG RE SUPP: 650.0000 mg | Freq: Four times a day (QID) | RECTAL | Status: DC | PRN

## 2018-10-09 MED ORDER — ADULT MULTIVITAMIN W/MINERALS CH
1.0000 | ORAL_TABLET | Freq: Every day | ORAL | Status: DC
  Administered 2018-10-09 – 2018-10-12 (×4): 1 via ORAL
  Filled 2018-10-09 (×4): qty 1

## 2018-10-09 MED ORDER — ONDANSETRON HCL 4 MG PO TABS: 4.0000 mg | ORAL_TABLET | Freq: Four times a day (QID) | ORAL | Status: DC | PRN

## 2018-10-09 MED ORDER — ONDANSETRON HCL 4 MG/2ML IJ SOLN
4.0000 mg | Freq: Four times a day (QID) | INTRAMUSCULAR | Status: DC | PRN
  Administered 2018-10-09: 15:00:00 4 mg via INTRAVENOUS
  Filled 2018-10-09: qty 2

## 2018-10-09 MED ORDER — SODIUM CHLORIDE 0.9 % IV BOLUS
1000.0000 mL | Freq: Once | INTRAVENOUS | Status: AC
  Administered 2018-10-09: 1000 mL via INTRAVENOUS

## 2018-10-09 MED ORDER — ENSURE ENLIVE PO LIQD
237.0000 mL | Freq: Two times a day (BID) | ORAL | Status: DC
  Administered 2018-10-09 – 2018-10-12 (×6): 237 mL via ORAL

## 2018-10-09 MED ORDER — ACETAMINOPHEN 325 MG PO TABS
650.0000 mg | ORAL_TABLET | Freq: Four times a day (QID) | ORAL | Status: DC | PRN
  Administered 2018-10-10: 23:00:00 650 mg via ORAL
  Filled 2018-10-09: qty 2

## 2018-10-09 MED ORDER — SODIUM CHLORIDE 0.9 % IV SOLN
1.0000 g | INTRAVENOUS | Status: DC
  Administered 2018-10-10 – 2018-10-11 (×2): 1 g via INTRAVENOUS
  Filled 2018-10-09 (×2): qty 10

## 2018-10-09 MED ORDER — POTASSIUM CHLORIDE CRYS ER 20 MEQ PO TBCR
40.0000 meq | EXTENDED_RELEASE_TABLET | Freq: Once | ORAL | Status: AC
  Administered 2018-10-09: 04:00:00 40 meq via ORAL
  Filled 2018-10-09: qty 2

## 2018-10-09 MED ORDER — METOPROLOL SUCCINATE ER 50 MG PO TB24
50.0000 mg | ORAL_TABLET | Freq: Every day | ORAL | Status: DC
  Administered 2018-10-09 – 2018-10-12 (×4): 50 mg via ORAL
  Filled 2018-10-09 (×4): qty 1

## 2018-10-09 MED ORDER — SODIUM CHLORIDE 0.9 % IV BOLUS
500.0000 mL | Freq: Once | INTRAVENOUS | Status: AC
  Administered 2018-10-09: 500 mL via INTRAVENOUS

## 2018-10-09 MED ORDER — RIVASTIGMINE TARTRATE 3 MG PO CAPS
3.0000 mg | ORAL_CAPSULE | Freq: Two times a day (BID) | ORAL | Status: DC
  Administered 2018-10-09 – 2018-10-12 (×7): 3 mg via ORAL
  Filled 2018-10-09 (×9): qty 1

## 2018-10-09 NOTE — ED Provider Notes (Signed)
[facility] EMERGENCY DEPARTMENT Provider Note   CSN: [id number] Arrival date & time: 10/09/18  0133  Time seen 2:32 AM  History   Chief Complaint Chief Complaint  Patient presents with  . Emesis    fever, weakness    HPI Theresa Hunt is a 85 y.o. female.     HPI patient presents from home via EMS.  She states several days ago she started feeling her heart was racing and she felt weak.  She states her feet feel numb.  She went outside a couple days ago and fell bruising her right elbow.  She denies any pain in the elbow.  She complains of a lot of nausea but has only vomited once.  She denies coughing, sore throat, diarrhea, or any urinary symptoms, diaphoresis, chest pain, abdominal pain, but states she feels like she is constipated.  She denies having any abdominal pain however.  She denies being around anybody else who is ill.  PCP [name], MD   Past Medical History:  Diagnosis Date  . Hyperlipidemia   . Hypertension   . Interstitial cystitis   . SVT (supraventricular tachycardia) [facility])     Patient Active Problem List   Diagnosis Date Noted  . Interstitial cystitis   . SVT (supraventricular tachycardia) ([place]) 09/06/2012    Past Surgical History:  Procedure Laterality Date  . ABDOMINAL HYSTERECTOMY    . CHOLECYSTECTOMY    . REVISION UROSTOMY CUTANEOUS       OB History   No obstetric history on file.      Home Medications    Prior to Admission medications   Medication Sig Start Date End Date Taking? Authorizing Provider  aspirin 81 MG tablet Take 81 mg by mouth daily.    [provider]  Cholecalciferol (VITAMIN D3) 3000 UNITS TABS Take 2,000 Units by mouth.    [provider]  Cyanocobalamin (VITAMIN B 12 PO) Take by mouth. Reported on 06/18/2015    [provider]  fish oil-omega-3 fatty acids 1000 MG capsule Take 1 g by mouth daily.    [provider]  fluticasone (FLONASE) 50 MCG/ACT nasal spray Place 2 sprays  into the nose daily.    [provider]  MAGNESIUM PO Take 250 mg by mouth daily.     [provider]  metoprolol succinate (TOPROL-XL) 50 MG 24 hr tablet TAKE ONE (1) TABLET EACH DAY 03/25/17   Troy Sine, MD  rivastigmine (EXELON) 3 MG capsule Take 1 capsule (3 mg total) by mouth 2 (two) times daily. Patient not taking: Reported on 10/02/2018 09/15/18   [name], MD    Family History Family History  Problem Relation Age of Onset  . Emphysema Father   . CAD Mother 64  . Cancer Daughter        Breast    Social History Social History   Tobacco Use  . Smoking status: Never Smoker  . Smokeless tobacco: Never Used  Substance Use Topics  . Alcohol use: No    Frequency: Never  . Drug use: No  Lives at home Lives alone   Allergies   Codeine   Review of Systems Review of Systems  All other systems reviewed and are negative.    Physical Exam Updated Vital Signs BP (!) 134/53   Pulse (!) 107   Temp 98 F (36.7 C) (Oral)   Resp (!) 23   Ht 5\' 5"  (1.651 m)   Wt 59 kg   SpO2  92%   BMI 21.63 kg/m   Physical Exam Vitals signs and nursing note reviewed.  Constitutional:      Appearance: Normal appearance.     Comments: Frail elderly female  HENT:     Head: Normocephalic and atraumatic.     Right Ear: External ear normal.     Left Ear: External ear normal.     Nose: Nose normal.     Mouth/Throat:     Mouth: Mucous membranes are dry.     Pharynx: No oropharyngeal exudate or posterior oropharyngeal erythema.  Eyes:     Extraocular Movements: Extraocular movements intact.     Conjunctiva/sclera: Conjunctivae normal.     Pupils: Pupils are equal, round, and reactive to light.  Neck:     Musculoskeletal: Normal range of motion.  Cardiovascular:     Rate and Rhythm: Normal rate and regular rhythm.     Pulses: Normal pulses.  Pulmonary:     Effort: Pulmonary effort is normal. No respiratory distress.     Breath sounds: Normal breath  sounds.  Abdominal:     General: There is distension.     Palpations: Abdomen is soft.     Comments: Urostomy in the right mid abdomen  Musculoskeletal: Normal range of motion.        General: Swelling present.     Comments: Patient has some bruising around her right elbow however she uses her right arm freely without apparent discomfort.  There was a abrasion on the back of the elbow that is healing well.  There is no redness or purulence to suggest infection.  Skin:    General: Skin is warm and dry.     Findings: Bruising present.  Neurological:     General: No focal deficit present.     Mental Status: She is alert and oriented to person, place, and time.     Cranial Nerves: No cranial nerve deficit.  Psychiatric:        Mood and Affect: Mood normal.        Behavior: Behavior normal.        Thought Content: Thought content normal.      ED Treatments / Results  Labs (all labs ordered are listed, but only abnormal results are displayed) Results for orders placed or performed during the hospital encounter of 10/09/18  Comprehensive metabolic panel  Result Value Ref Range   Sodium 133 (L) 135 - 145 mmol/L   Potassium 3.0 (L) 3.5 - 5.1 mmol/L   Chloride 100 98 - 111 mmol/L   CO2 23 22 - 32 mmol/L   Glucose, Bld 218 (H) 70 - 99 mg/dL   BUN 25 (H) 8 - 23 mg/dL   Creatinine, Ser 0.91 0.44 - 1.00 mg/dL   Calcium 9.7 8.9 - 10.3 mg/dL   Total Protein 6.7 6.5 - 8.1 g/dL   Albumin 3.6 3.5 - 5.0 g/dL   AST 40 15 - 41 U/L   ALT 31 0 - 44 U/L   Alkaline Phosphatase 96 38 - 126 U/L   Total Bilirubin 1.8 (H) 0.3 - 1.2 mg/dL   GFR calc non Af Amer 57 (L) >60 mL/min   GFR calc Af Amer >60 >60 mL/min   Anion gap 10 5 - 15  Lipase, blood  Result Value Ref Range   Lipase 22 11 - 51 U/L  CBC with Differential  Result Value Ref Range   WBC 10.9 (H) 4.0 - 10.5 K/uL   RBC 4.53 3.87 - 5.11  MIL/uL   Hemoglobin 13.7 12.0 - 15.0 g/dL   HCT 41.0 36.0 - 46.0 %   MCV 90.5 80.0 - 100.0 fL    MCH 30.2 26.0 - 34.0 pg   MCHC 33.4 30.0 - 36.0 g/dL   RDW 12.7 11.5 - 15.5 %   Platelets 119 (L) 150 - 400 K/uL   nRBC 0.0 0.0 - 0.2 %   Neutrophils Relative % 94 %   Neutro Abs 10.3 (H) 1.7 - 7.7 K/uL   Lymphocytes Relative 3 %   Lymphs Abs 0.3 (L) 0.7 - 4.0 K/uL   Monocytes Relative 2 %   Monocytes Absolute 0.2 0.1 - 1.0 K/uL   Eosinophils Relative 0 %   Eosinophils Absolute 0.0 0.0 - 0.5 K/uL   Basophils Relative 0 %   Basophils Absolute 0.0 0.0 - 0.1 K/uL   Immature Granulocytes 1 %   Abs Immature Granulocytes 0.05 0.00 - 0.07 K/uL  Urinalysis, Routine w reflex microscopic  Result Value Ref Range   Color, Urine YELLOW YELLOW   APPearance HAZY (A) CLEAR   Specific Gravity, Urine 1.008 1.005 - 1.030   pH 8.0 5.0 - 8.0   Glucose, UA NEGATIVE NEGATIVE mg/dL   Hgb urine dipstick LARGE (A) NEGATIVE   Bilirubin Urine NEGATIVE NEGATIVE   Ketones, ur NEGATIVE NEGATIVE mg/dL   Protein, ur 100 (A) NEGATIVE mg/dL   Nitrite NEGATIVE NEGATIVE   Leukocytes,Ua LARGE (A) NEGATIVE   RBC / HPF >50 (H) 0 - 5 RBC/hpf   WBC, UA 21-50 0 - 5 WBC/hpf   Bacteria, UA RARE (A) NONE SEEN   Squamous Epithelial / LPF 0-5 0 - 5   WBC Clumps PRESENT    Laboratory interpretation all normal except possible UTI however urine sample is from urostomy bag, hypokalemia, mild hyperglycemia    EKG EKG Interpretation  Date/Time:  Monday October 09 2018 03:53:09 EDT Ventricular Rate:  106 PR Interval:    QRS Duration: 85 QT Interval:  355 QTC Calculation: 472 R Axis:   86 Text Interpretation:  Sinus tachycardia Atrial premature complex Borderline right axis deviation Anteroseptal infarct, old Baseline wander in lead(s) II Since last tracing rate faster 30 Jun 2006 Confirmed by Rolland Porter [phone]) on 10/09/2018 4:00:51 AM   Radiology Dg Abdomen Acute W/chest  Result Date: 10/09/2018 CLINICAL DATA:  Nausea, generalized weakness, hyperglycemia EXAM: DG ABDOMEN ACUTE W/ 1V CHEST COMPARISON:  CT  abdomen/pelvis dated 10/11/2017 FINDINGS: Lungs are essentially clear. No focal consolidation. No pleural effusion or pneumothorax. The heart is top-normal in size.  Thoracic aortic atherosclerosis. Nonobstructive bowel gas pattern.  Moderate rectal stool burden. Vascular calcifications.  Surgical clips in the pelvis. No evidence of free air under the diaphragm on the upright view. IMPRESSION: No evidence of acute cardiopulmonary disease. No evidence of small bowel obstruction or free air. Electronically Signed   By: Julian Hy M.D.   On: 10/09/2018 03:40    Procedures Procedures (including critical care time)   Urine culture September 13, 2018    Component 3wk ago  Urine Culture, Routine Final reportAbnormal    Organism ID, Bacteria Escherichia coliAbnormal    Comment: 50,000-100,000 colony forming units per mL  Cefazolin <=4 ug/mL  Cefazolin with an MIC <=16 predicts susceptibility to the oral agents  cefaclor, cefdinir, cefpodoxime, cefprozil, cefuroxime, cephalexin,  and loracarbef when used for therapy of uncomplicated urinary tract  infections due to E. coli, Klebsiella pneumoniae, and Proteus  mirabilis.   ORGANISM ID, BACTERIA Enterococcus faecalisAbnormal  Comment: Greater than 100,000 colony forming units per mL  Antimicrobial Susceptibility Comment   Comment:   ** S = Susceptible; I = Intermediate; R = Resistant **           P = Positive; N = Negative        MICS are expressed in micrograms per mL   Antibiotic         RSLT#1  RSLT#2  RSLT#3  RSLT#4  Amoxicillin/Clavulanic Acid  S  Ampicillin           R  Cefepime            S  Ceftriaxone          S  Cefuroxime           S  Ciprofloxacin         R     S  Ertapenem           S  Gentamicin           S  Imipenem            S  Levofloxacin          R     S  Meropenem            S  Nitrofurantoin         S     S  Penicillin                S  Piperacillin/Tazobactam    S  Tetracycline          S     R  Tobramycin           S  Trimethoprim/Sulfa       S  Vancomycin                S   Resulting Agency [facility]         Medications Ordered in ED Medications  cefTRIAXone (ROCEPHIN) 1 g in sodium chloride 0.9 % 100 mL IVPB (1 g Intravenous New Bag/Given 10/09/18 0505)  sodium chloride 0.9 % bolus 500 mL (0 mLs Intravenous Stopped 10/09/18 0323)  sodium chloride 0.9 % bolus 1,000 mL (0 mLs Intravenous Stopped 10/09/18 0526)  potassium chloride SA (K-DUR) CR tablet 40 mEq (40 mEq Oral Given 10/09/18 0347)     Initial Impression / Assessment and Plan / ED Course  I have reviewed the triage vital signs and the nursing notes.  Pertinent labs & imaging results that were available during my care of the patient were reviewed by me and considered in my medical decision making (see chart for details).   Patient presents complaining of nausea and weakness and feeling her heart is racing.  She did have a low-grade fever.  She was given IV fluids.   Patient was given oral potassium when her lab work showed she had a hypokalemia.   Son called and states patient is on 2 antibiotics at home for urinary tract infection.  Patient had urine culture done July 1 which showed 50-100,000 colonies of E. coli and greater than 100,000 colonies of Enterococcus faecalis that was sensitive to Rocephin.  Recheck at 4:10 AM patient states her nausea is better.  Patient is elderly at home and lives at home alone, she has been weak and falling outside and states she had to wait for her neighbor to find her.  I am going to talk to the hospitalist about admission.  5:30 AM Dr. Darrick Meigs,  hospitalist will admit  Final Clinical Impressions(s) / ED Diagnoses   Final diagnoses:  Weakness  Nausea  Hypokalemia  Fall in home,  initial encounter    Plan admission  Rolland Porter, MD, Barbette Or, MD 10/09/18 [phone]

## 2018-10-09 NOTE — Progress Notes (Signed)
Initial Nutrition Assessment  DOCUMENTATION CODES:      INTERVENTION:  Ensure Enlive po BID, each supplement provides 350 kcal and 20 grams of protein   Provide Prune juice with dinner daily while inpatient   Request re-wt given wt records show a loss of 5.4 kg in 1 week. Pending  NUTRITION DIAGNOSIS:   Inadequate oral intake related to acute illness, chronic illness, nausea, decreased appetite(Worsening dementia per son) as evidenced by per patient/family report, percent weight loss, meal completion < 50%.   GOAL:  Patient will meet greater than or equal to 90% of their needs(if feasible given her cognitive impairment)   MONITOR:  PO intake, Supplement acceptance, Labs, Weight trends  REASON FOR ASSESSMENT:   Malnutrition Screening Tool    ASSESSMENT: patient is an 85 yo female with dementia. Recently treated for a fall and a UTI. Moderate- rectal stool burden per CT.  Patient intake has been limited the past week and per RN family has been having to "beg her to eat/drink".   Nursing is verifying wt change but expect patient has acute wt loss given the report of minimal food and beverage intake prior to admission and recent UTI.  Patient is complaining of nausea and says "I feel like I am going to throw up". Her nurse is giving her anti-nausea medication. Patient is constipated and says she likes prune juice. Will provide on her tray. Today she is drinking well for her nurse 3 cups of fluid completed. Patient lunch tray is here but she doesn't want anything to eat.    Medications reviewed and include: Exelon, Rocephin, KCL  Labs: Sodium 133 (L), Potassium 3.0 (L), Glu 218 (H), BUN 25 (H) BMP Latest Ref Rng & Units 10/09/2018 10/16/2015  Glucose 70 - 99 mg/dL 218(H) -  BUN 8 - 23 mg/dL 25(H) -  Creatinine 0.44 - 1.00 mg/dL 0.91 0.80  Sodium 135 - 145 mmol/L 133(L) -  Potassium 3.5 - 5.1 mmol/L 3.0(L) -  Chloride 98 - 111 mmol/L 100 -  CO2 22 - 32 mmol/L 23 -  Calcium 8.9 -  10.3 mg/dL 9.7 -     NUTRITION - FOCUSED PHYSICAL EXAM:  Unable to complete Nutrition-Focused physical exam at this time.    Diet Order:   Diet Order            Diet regular Room service appropriate? Yes; Fluid consistency: Thin  Diet effective now              EDUCATION NEEDS:   Not appropriate for education at this time Skin:  Skin Assessment: (urostomy)  Last BM:  7/26  Height:   Ht Readings from Last 1 Encounters:  10/09/18 5\' 5"  (1.651 m)    Weight:   Wt Readings from Last 1 Encounters:  10/09/18 59 kg    Ideal Body Weight:  57 kg  BMI:  Body mass index is 21.63 kg/m.  Estimated Nutritional Needs:   Kcal:  1194-1740  Protein:  70-77 gr  Fluid:  >1500 ml daily   Colman Cater MS,RD,CSG,LDN Office: [phone] Pager: [phone]

## 2018-10-09 NOTE — Progress Notes (Signed)
Patient seen and examined. Admitted after midnight secondary to weakness, worsening mentation and findings of UTI. Patient with underlying hx of dementia and recurrent UTI. Hemodynamically stable and currently afebrile. Please refer to H&P written by Dr. Darrick Meigs for further info/details.  Plan: continue IVF's -continue IV antibiotics -follow urine culture results.  Barton Dubois [phone]

## 2018-10-09 NOTE — ED Notes (Signed)
Patient transported to X-ray 

## 2018-10-09 NOTE — ED Triage Notes (Signed)
EMS reports patient having nausea and generalized weakness x 2 days. EMS report blood glucose is 201. Patient states she feels nauseated. Patient's current oral temp is 100.4. Patient alert and oriented at this time but does have a history of dementia.

## 2018-10-09 NOTE — ED Notes (Signed)
Family called on the phone and states that patient is currently taking 2 antibiotics at home for a UTI.

## 2018-10-09 NOTE — ED Notes (Signed)
Please contact [name] (patient's son) phone number is [phone].

## 2018-10-09 NOTE — H&P (Signed)
TRH H&P    Patient Demographics:    Theresa Hunt, is a 85 y.o. female  MRN: [id number]  DOB - 09-29-1929  Admit Date - 10/09/2018  Referring MD/NP/PA: Dr. Tomi Bamberger  Outpatient Primary MD for the patient is [name], MD  Patient coming from: Home  Chief complaint-fever   HPI:    Theresa Hunt  is a 85 y.o. female, with history of dementia, interstitial cystitis, status post urostomy, SVT, hypertension, hyperlipidemia was brought to hospital due to generalized weakness for past 2 days along with nausea.  Patient's history is limited due to history of underlying dementia.  As per ED notes patient has been feeling weak for past 2 days.  Also complained of palpitations.  Patient went outside couple of days ago and fell bruising her right elbow.  Denies any pain in the elbow at this time. Denies coughing, chest pain or shortness of breath. Denies diarrhea. Patient was recent diagnosed with UTI urine culture performed on July 1 showed E. coli and Enterococcus faecalis which was sensitive to Rocephin.    Review of systems:    In addition to the HPI above,    All other systems reviewed and are negative.    Past History of the following :    Past Medical History:  Diagnosis Date  . Hyperlipidemia   . Hypertension   . Interstitial cystitis   . SVT (supraventricular tachycardia) (HCC)       Past Surgical History:  Procedure Laterality Date  . ABDOMINAL HYSTERECTOMY    . CHOLECYSTECTOMY    . REVISION UROSTOMY CUTANEOUS        Social History:      Social History   Tobacco Use  . Smoking status: Never Smoker  . Smokeless tobacco: Never Used  Substance Use Topics  . Alcohol use: No    Frequency: Never       Family History :     Family History  Problem Relation Age of Onset  . Emphysema Father   . CAD Mother 15  . Cancer Daughter        Breast      Home Medications:   Prior to  Admission medications   Medication Sig Start Date End Date Taking? Authorizing Provider  aspirin 81 MG tablet Take 81 mg by mouth daily.    [provider]  Cholecalciferol (VITAMIN D3) 3000 UNITS TABS Take 2,000 Units by mouth.    [provider]  Cyanocobalamin (VITAMIN B 12 PO) Take by mouth. Reported on 06/18/2015    [provider]  fish oil-omega-3 fatty acids 1000 MG capsule Take 1 g by mouth daily.    [provider]  fluticasone (FLONASE) 50 MCG/ACT nasal spray Place 2 sprays into the nose daily.    [provider]  MAGNESIUM PO Take 250 mg by mouth daily.     [provider]  metoprolol succinate (TOPROL-XL) 50 MG 24 hr tablet TAKE ONE (1) TABLET EACH DAY 03/25/17   Troy Sine, MD  rivastigmine (EXELON) 3 MG capsule Take  1 capsule (3 mg total) by mouth 2 (two) times daily. Patient not taking: Reported on 10/02/2018 09/15/18   [name], MD     Allergies:     Allergies  Allergen Reactions  . Codeine Other (See Comments)    Pt. does not remember     Physical Exam:   Vitals  Blood pressure (!) 134/53, pulse (!) 107, temperature 98 F (36.7 C), temperature source Oral, resp. rate (!) 23, height 5\' 5"  (1.651 m), weight 59 kg, SpO2 92 %.  1.  General: Appears in no acute distress  2. Psychiatric: Alert, oriented to self only  3. Neurologic: Cranial nerves II through grossly intact, motor strength 5/5 in all extremities  4. HEENMT:  Atraumatic normocephalic, extraocular muscles are intact  5. Respiratory : Clear to auscultation bilaterally, no wheezing or crackles  6. Cardiovascular : S1-S2, regular, no murmur auscultated, no edema in the lower extremities  7. Gastrointestinal:  Abdomen is soft, mild distention, urostomy bag in place, nontender to palpation, no rigidity or guarding  8. Skin:  No rashes noted      Data Review:    CBC Recent Labs  Lab 10/09/18 0224  WBC 10.9*  HGB 13.7  HCT 41.0   PLT 119*  MCV 90.5  MCH 30.2  MCHC 33.4  RDW 12.7  LYMPHSABS 0.3*  MONOABS 0.2  EOSABS 0.0  BASOSABS 0.0   ------------------------------------------------------------------------------------------------------------------  Results for orders placed or performed during the hospital encounter of 10/09/18 (from the past 48 hour(s))  Comprehensive metabolic panel     Status: Abnormal   Collection Time: 10/09/18  2:24 AM  Result Value Ref Range   Sodium 133 (L) 135 - 145 mmol/L   Potassium 3.0 (L) 3.5 - 5.1 mmol/L   Chloride 100 98 - 111 mmol/L   CO2 23 22 - 32 mmol/L   Glucose, Bld 218 (H) 70 - 99 mg/dL   BUN 25 (H) 8 - 23 mg/dL   Creatinine, Ser 0.91 0.44 - 1.00 mg/dL   Calcium 9.7 8.9 - 10.3 mg/dL   Total Protein 6.7 6.5 - 8.1 g/dL   Albumin 3.6 3.5 - 5.0 g/dL   AST 40 15 - 41 U/L   ALT 31 0 - 44 U/L   Alkaline Phosphatase 96 38 - 126 U/L   Total Bilirubin 1.8 (H) 0.3 - 1.2 mg/dL   GFR calc non Af Amer 57 (L) >60 mL/min   GFR calc Af Amer >60 >60 mL/min   Anion gap 10 5 - 15    Comment: Performed at [facility], [address], [place], [place] [address]  Lipase, blood     Status: None   Collection Time: 10/09/18  2:24 AM  Result Value Ref Range   Lipase 22 11 - 51 U/L    Comment: Performed at [facility], [address], [place], [place] [address]  CBC with Differential     Status: Abnormal   Collection Time: 10/09/18  2:24 AM  Result Value Ref Range   WBC 10.9 (H) 4.0 - 10.5 K/uL   RBC 4.53 3.87 - 5.11 MIL/uL   Hemoglobin 13.7 12.0 - 15.0 g/dL   HCT 41.0 36.0 - 46.0 %   MCV 90.5 80.0 - 100.0 fL   MCH 30.2 26.0 - 34.0 pg   MCHC 33.4 30.0 - 36.0 g/dL   RDW 12.7 11.5 - 15.5 %   Platelets 119 (L) 150 - 400 K/uL   nRBC 0.0 0.0 - 0.2 %  Neutrophils Relative % 94 %   Neutro Abs 10.3 (H) 1.7 - 7.7 K/uL   Lymphocytes Relative 3 %   Lymphs Abs 0.3 (L) 0.7 - 4.0 K/uL   Monocytes Relative 2 %   Monocytes Absolute 0.2 0.1 - 1.0 K/uL   Eosinophils Relative 0 %    Eosinophils Absolute 0.0 0.0 - 0.5 K/uL   Basophils Relative 0 %   Basophils Absolute 0.0 0.0 - 0.1 K/uL   Immature Granulocytes 1 %   Abs Immature Granulocytes 0.05 0.00 - 0.07 K/uL    Comment: Performed at [facility], [address] [place], [place] [address]  Urinalysis, Routine w reflex microscopic     Status: Abnormal   Collection Time: 10/09/18  3:13 AM  Result Value Ref Range   Color, Urine YELLOW YELLOW   APPearance HAZY (A) CLEAR   Specific Gravity, Urine 1.008 1.005 - 1.030   pH 8.0 5.0 - 8.0   Glucose, UA NEGATIVE NEGATIVE mg/dL   Hgb urine dipstick LARGE (A) NEGATIVE   Bilirubin Urine NEGATIVE NEGATIVE   Ketones, ur NEGATIVE NEGATIVE mg/dL   Protein, ur 100 (A) NEGATIVE mg/dL   Nitrite NEGATIVE NEGATIVE   Leukocytes,Ua LARGE (A) NEGATIVE   RBC / HPF >50 (H) 0 - 5 RBC/hpf   WBC, UA 21-50 0 - 5 WBC/hpf   Bacteria, UA RARE (A) NONE SEEN   Squamous Epithelial / LPF 0-5 0 - 5   WBC Clumps PRESENT     Comment: Performed at [facility], [address] [place], [place] [address]    Chemistries  Recent Labs  Lab 10/09/18 0224  NA 133*  K 3.0*  CL 100  CO2 23  GLUCOSE 218*  BUN 25*  CREATININE 0.91  CALCIUM 9.7  AST 40  ALT 31  ALKPHOS 96  BILITOT 1.8*   ------------------------------------------------------------------------------------------------------------------  ------------------------------------------------------------------------------------------------------------------ GFR: Estimated Creatinine Clearance: 39.2 mL/min (by C-G formula based on SCr of 0.91 mg/dL). Liver Function Tests: Recent Labs  Lab 10/09/18 0224  AST 40  ALT 31  ALKPHOS 96  BILITOT 1.8*  PROT 6.7  ALBUMIN 3.6   Recent Labs  Lab 10/09/18 0224  LIPASE 22   No results for input(s): AMMONIA in the last 168 hours. Coagulation Profile: No results for input(s): INR, PROTIME in the last 168 hours. Cardiac Enzymes: No results for input(s): CKTOTAL, CKMB, CKMBINDEX,  TROPONINI in the last 168 hours. BNP (last 3 results) No results for input(s): PROBNP in the last 8760 hours. HbA1C: No results for input(s): HGBA1C in the last 72 hours. CBG: No results for input(s): GLUCAP in the last 168 hours. Lipid Profile: No results for input(s): CHOL, HDL, LDLCALC, TRIG, CHOLHDL, LDLDIRECT in the last 72 hours. Thyroid Function Tests: No results for input(s): TSH, T4TOTAL, FREET4, T3FREE, THYROIDAB in the last 72 hours. Anemia Panel: No results for input(s): VITAMINB12, FOLATE, FERRITIN, TIBC, IRON, RETICCTPCT in the last 72 hours.  --------------------------------------------------------------------------------------------------------------- Urine analysis:    Component Value Date/Time   COLORURINE YELLOW 10/09/2018 0313   APPEARANCEUR HAZY (A) 10/09/2018 0313   APPEARANCEUR Cloudy (A) 09/13/2018 1414   LABSPEC 1.008 10/09/2018 0313   PHURINE 8.0 10/09/2018 0313   GLUCOSEU NEGATIVE 10/09/2018 0313   HGBUR LARGE (A) 10/09/2018 0313   BILIRUBINUR NEGATIVE 10/09/2018 0313   BILIRUBINUR Negative 09/13/2018 1414   KETONESUR NEGATIVE 10/09/2018 0313   PROTEINUR 100 (A) 10/09/2018 0313   NITRITE NEGATIVE 10/09/2018 0313   LEUKOCYTESUR LARGE (A) 10/09/2018 0313      Imaging Results:  Dg Abdomen Acute W/chest  Result Date: 10/09/2018 CLINICAL DATA:  Nausea, generalized weakness, hyperglycemia EXAM: DG ABDOMEN ACUTE W/ 1V CHEST COMPARISON:  CT abdomen/pelvis dated 10/11/2017 FINDINGS: Lungs are essentially clear. No focal consolidation. No pleural effusion or pneumothorax. The heart is top-normal in size.  Thoracic aortic atherosclerosis. Nonobstructive bowel gas pattern.  Moderate rectal stool burden. Vascular calcifications.  Surgical clips in the pelvis. No evidence of free air under the diaphragm on the upright view. IMPRESSION: No evidence of acute cardiopulmonary disease. No evidence of small bowel obstruction or free air. Electronically Signed   By:  Julian Hy M.D.   On: 10/09/2018 03:40    My personal review of EKG: Rhythm NSR, no ST-T changes   Assessment & Plan:    Active Problems:   UTI (urinary tract infection)   1. UTI-patient found to have abnormal UA, urine culture has been obtained.  Started on Rocephin.  We will continue Rocephin 1 g IV every 24 hours.  Follow urine culture results.  2. Hypokalemia-potassium is 3.0, will replace potassium.  Check BMP in a.m.  3. Dementia, with no behavior disturbance, continue Exelon.  4. Hypertension-continue metoprolol.    DVT Prophylaxis-   Lovenox   AM Labs Ordered, also please review Full Orders  Family Communication: Admission, patients condition and plan of care including tests being ordered have been discussed with the patient  who indicate understanding and agree with the plan and Code Status.  Code Status: Full code  Admission status: Inpatient: Based on patients clinical presentation and evaluation of above clinical data, I have made determination that patient meets Inpatient criteria at this time.  Time spent in minutes : 60 minutes   Oswald Hillock M.D on 10/09/2018 at 6:04 AM

## 2018-10-10 DIAGNOSIS — R531 Weakness: Secondary | ICD-10-CM

## 2018-10-10 LAB — CBC
HCT: 36.5 % (ref 36.0–46.0)
Hemoglobin: 12 g/dL (ref 12.0–15.0)
MCH: 30.2 pg (ref 26.0–34.0)
MCHC: 32.9 g/dL (ref 30.0–36.0)
MCV: 91.9 fL (ref 80.0–100.0)
Platelets: 101 10*3/uL — ABNORMAL LOW (ref 150–400)
RBC: 3.97 MIL/uL (ref 3.87–5.11)
RDW: 13.2 % (ref 11.5–15.5)
WBC: 11.8 10*3/uL — ABNORMAL HIGH (ref 4.0–10.5)
nRBC: 0 % (ref 0.0–0.2)

## 2018-10-10 LAB — COMPREHENSIVE METABOLIC PANEL
ALT: 24 U/L (ref 0–44)
AST: 27 U/L (ref 15–41)
Albumin: 2.8 g/dL — ABNORMAL LOW (ref 3.5–5.0)
Alkaline Phosphatase: 86 U/L (ref 38–126)
Anion gap: 7 (ref 5–15)
BUN: 24 mg/dL — ABNORMAL HIGH (ref 8–23)
CO2: 24 mmol/L (ref 22–32)
Calcium: 8.7 mg/dL — ABNORMAL LOW (ref 8.9–10.3)
Chloride: 108 mmol/L (ref 98–111)
Creatinine, Ser: 0.85 mg/dL (ref 0.44–1.00)
GFR calc Af Amer: >60 mL/min (ref >60)
GFR calc non Af Amer: >60 mL/min (ref >60)
Glucose, Bld: 127 mg/dL — ABNORMAL HIGH (ref 70–99)
Potassium: 4.2 mmol/L (ref 3.5–5.1)
Sodium: 139 mmol/L (ref 135–145)
Total Bilirubin: 0.7 mg/dL (ref 0.3–1.2)
Total Protein: 5.4 g/dL — ABNORMAL LOW (ref 6.5–8.1)

## 2018-10-10 NOTE — Evaluation (Signed)
Physical Therapy Evaluation Patient Details Name: SHOUA ULLOA MRN: [id number] DOB: [birth year] Today's Date: 10/10/2018   History of Present Illness  Kenidee Cregan  is a 85 y.o. female, with history of dementia, interstitial cystitis, status post urostomy, SVT, hypertension, hyperlipidemia was brought to hospital due to generalized weakness for past 2 days along with nausea.  Patient's history is limited due to history of underlying dementia.  As per ED notes patient has been feeling weak for past 2 days.  Also complained of palpitations.  Patient went outside couple of days ago and fell bruising her right elbow.  Denies any pain in the elbow at this time.Denies coughing, chest pain or shortness of breath.    Clinical Impression  Patient functioning near baseline for functional mobility and gait, demonstrates good return for ambulation on level, inclined, and declined surfaces without loss of balance.  Patient also able to ambulate up/down 4 steps using 1 side rail without loss of balance.  Plan:  Patient discharged from physical therapy to care of nursing for ambulation daily as tolerated for length of stay.     Follow Up Recommendations Home health PT    Equipment Recommendations  None recommended by PT    Recommendations for Other Services       Precautions / Restrictions Precautions Precautions: None Restrictions Weight Bearing Restrictions: No      Mobility  Bed Mobility Overal bed mobility: Modified Independent             General bed mobility comments: increased time  Transfers Overall transfer level: Modified independent               General transfer comment: increased time  Ambulation/Gait Ambulation/Gait assistance: Supervision Gait Distance (Feet): 200 Feet Assistive device: None Gait Pattern/deviations: WFL(Within Functional Limits) Gait velocity: decreased   General Gait Details: demonstrates good return for ambulation on level, inclined and declined  surfaces without loss of balance  Stairs            Wheelchair Mobility    Modified Rankin (Stroke Patients Only)       Balance Overall balance assessment: No apparent balance deficits (not formally assessed)                                           Pertinent Vitals/Pain      Home Living Family/patient expects to be discharged to:: Private residence Living Arrangements: Alone Available Help at Discharge: Family;Available 24 hours/day Type of Home: House Home Access: Stairs to enter Entrance Stairs-Rails: Right;Left;Can reach both Entrance Stairs-Number of Steps: 4-5 Home Layout: One level Home Equipment: Bedside commode;Shower seat      Prior Function Level of Independence: Needs assistance   Gait / Transfers Assistance Needed: Household and short distanced community ambulator without AD, "per patient"  ADL's / Homemaking Assistance Needed: family assist with community needs  Comments: Household and commu     Journalist, newspaper        Extremity/Trunk Assessment   Upper Extremity Assessment Upper Extremity Assessment: Overall WFL for tasks assessed    Lower Extremity Assessment Lower Extremity Assessment: Overall WFL for tasks assessed    Cervical / Trunk Assessment Cervical / Trunk Assessment: Normal  Communication   Communication: No difficulties  Cognition Arousal/Alertness: Awake/alert Behavior During Therapy: WFL for tasks assessed/performed Overall Cognitive Status: Within Functional Limits for tasks assessed  General Comments      Exercises     Assessment/Plan    PT Assessment All further PT needs can be met in the next venue of care  PT Problem List Decreased strength;Decreased activity tolerance;Decreased mobility       PT Treatment Interventions      PT Goals (Current goals can be found in the Care Plan section)  Acute Rehab PT Goals Patient Stated Goal:  return home PT Goal Formulation: With patient Time For Goal Achievement: 10/10/18 Potential to Achieve Goals: Good    Frequency     Barriers to discharge        Co-evaluation               AM-PAC PT "6 Clicks" Mobility  Outcome Measure Help needed turning from your back to your side while in a flat bed without using bedrails?: None Help needed moving from lying on your back to sitting on the side of a flat bed without using bedrails?: None Help needed moving to and from a bed to a chair (including a wheelchair)?: None Help needed standing up from a chair using your arms (e.g., wheelchair or bedside chair)?: None Help needed to walk in hospital room?: A Little Help needed climbing 3-5 steps with a railing? : A Little 6 Click Score: 22    End of Session   Activity Tolerance: Patient tolerated treatment well;Patient limited by fatigue Patient left: in chair;with call bell/phone within reach Nurse Communication: Mobility status PT Visit Diagnosis: Unsteadiness on feet (R26.81);Other abnormalities of gait and mobility (R26.89);Muscle weakness (generalized) (M62.81)    Time: 3976-7341 PT Time Calculation (min) (ACUTE ONLY): 23 min   Charges:   PT Evaluation $PT Eval Moderate Complexity: 1 Mod PT Treatments $Gait Training: 23-37 mins        3:35 PM, 10/10/18 Lonell Grandchild, MPT Physical Therapist with [facility] [phone] office [phone] mobile phone

## 2018-10-10 NOTE — Progress Notes (Signed)
PROGRESS NOTE    Theresa Hunt  MWU:[id number] DOB: [birth year] DOA: 10/09/2018 PCP: [name], MD     Brief Narrative:  85 y.o. female, with history of dementia, interstitial cystitis, status post urostomy, SVT, hypertension, hyperlipidemia was brought to hospital due to generalized weakness for past 2 days along with nausea.  Patient's history is limited due to history of underlying dementia.  As per ED notes patient has been feeling weak for past 2 days.  Also complained of palpitations.  Patient went outside couple of days ago and fell bruising her right elbow.  Denies any pain in the elbow at this time. Denies coughing, chest pain or shortness of breath. Denies diarrhea. Patient was recent diagnosed with UTI urine culture performed on July 1 showed E. coli and Enterococcus faecalis which was sensitive to Rocephin.   Assessment & Plan: 1-UTI -Continue supportive care -As needed antipyretic -Instructed to maintain adequate hydration -Continue Rocephin and follow urine culture results. -Patient denying dysuria and nausea currently.  2-hypokalemia: -Repleted and currently within normal limits -Magnesium stable.  3-dementia with no behavioral disturbance -Appears to be stable and with mentation at baseline -Continue Exelon BID -Continue constant reorientation and supportive care.  4-hypertension/history of SVT -Continue metoprolol  5-physical deconditioning -Improve after fluid resuscitation and treatment with antibiotics -Physical therapy has recommended home health PT.  DVT prophylaxis:  Code Status: Full code Family Communication: No family at bedside Disposition Plan: Physical therapy has evaluated patient and is recommending home health PT.  Continue IV antibiotics and follow urine culture.  Patient advised to maintain adequate hydration.  Continue supportive care.  Consultants:   None  Procedures:   See below for x-ray reports.  Antimicrobials:   Anti-infectives (From admission, onward)   Start     Dose/Rate Route Frequency Ordered Stop   10/10/18 0500  cefTRIAXone (ROCEPHIN) 1 g in sodium chloride 0.9 % 100 mL IVPB     1 g 200 mL/hr over 30 Minutes Intravenous Every 24 hours 10/09/18 0842     10/09/18 0445  cefTRIAXone (ROCEPHIN) 1 g in sodium chloride 0.9 % 100 mL IVPB     1 g 200 mL/hr over 30 Minutes Intravenous  Once 10/09/18 0439 10/09/18 0627       Subjective: Afebrile, no chest pain, no shortness of breath.  Mentation at baseline.  No nausea, no vomiting.  Objective: Vitals:   10/09/18 2124 10/10/18 0551 10/10/18 0950 10/10/18 1304  BP: (!) 113/56 (!) 145/87 (!) 141/89 (!) 161/89  Pulse: (!) 111 (!) 111 85 75  Resp:    16  Temp: 98.3 F (36.8 C) 98.4 F (36.9 C)  98.3 F (36.8 C)  TempSrc: Oral Oral  Oral  SpO2: 95% 97%  95%  Weight:      Height:        Intake/Output Summary (Last 24 hours) at 10/10/2018 1719 Last data filed at 10/10/2018 1511 Gross per 24 hour  Intake 2781.25 ml  Output 3000 ml  Net -218.75 ml   Filed Weights   10/09/18 0142  Weight: 59 kg    Examination: General exam: Alert, awake, oriented to place, person and year; no acute distress.  Denies chest pain or shortness of breath.  No fever.  Patient feeling weak. Respiratory system: Clear to auscultation. Respiratory effort normal. Cardiovascular system:RRR. No murmurs, rubs, gallops. Gastrointestinal system: Abdomen is nondistended, soft and nontender. No organomegaly or masses felt. Normal bowel sounds heard. Central nervous system: Alert and oriented. No focal neurological deficits. Extremities:  No C/C/E, +pedal pulses Skin: No rashes, lesions or ulcers Psychiatry: Judgement and insight appear normal. Mood & affect appropriate.     Data Reviewed: I have personally reviewed following labs and imaging studies  CBC: Recent Labs  Lab 10/09/18 0224 10/10/18 0531  WBC 10.9* 11.8*  NEUTROABS 10.3*  --   HGB 13.7 12.0  HCT  41.0 36.5  MCV 90.5 91.9  PLT 119* 008*   Basic Metabolic Panel: Recent Labs  Lab 10/09/18 0224 10/10/18 0531  NA 133* 139  K 3.0* 4.2  CL 100 108  CO2 23 24  GLUCOSE 218* 127*  BUN 25* 24*  CREATININE 0.91 0.85  CALCIUM 9.7 8.7*   GFR: Estimated Creatinine Clearance: 42 mL/min (by C-G formula based on SCr of 0.85 mg/dL).   Liver Function Tests: Recent Labs  Lab 10/09/18 0224 10/10/18 0531  AST 40 27  ALT 31 24  ALKPHOS 96 86  BILITOT 1.8* 0.7  PROT 6.7 5.4*  ALBUMIN 3.6 2.8*   Recent Labs  Lab 10/09/18 0224  LIPASE 22   Urine analysis:    Component Value Date/Time   COLORURINE YELLOW 10/09/2018 0313   APPEARANCEUR HAZY (A) 10/09/2018 0313   APPEARANCEUR Cloudy (A) 09/13/2018 1414   LABSPEC 1.008 10/09/2018 0313   PHURINE 8.0 10/09/2018 0313   GLUCOSEU NEGATIVE 10/09/2018 0313   HGBUR LARGE (A) 10/09/2018 0313   BILIRUBINUR NEGATIVE 10/09/2018 0313   BILIRUBINUR Negative 09/13/2018 [place] 10/09/2018 0313   PROTEINUR 100 (A) 10/09/2018 0313   NITRITE NEGATIVE 10/09/2018 0313   LEUKOCYTESUR LARGE (A) 10/09/2018 0313    Recent Results (from the past 240 hour(s))  Urine culture     Status: Abnormal (Preliminary result)   Collection Time: 10/09/18  3:13 AM   Specimen: Urine, Catheterized  Result Value Ref Range Status   Specimen Description   Final    URINE, CATHETERIZED Performed at [facility], [address] [place], [place] [address]    Special Requests   Final    Normal Performed at [facility], [address] [place], [place] [address]    Culture (A)  Final    >=100,000 COLONIES/mL ESCHERICHIA COLI SUSCEPTIBILITIES TO FOLLOW Performed at [facility], [place] [address] [place], [place] [address]    Report Status PENDING  Incomplete  SARS Coronavirus 2 (CEPHEID - Performed in [place] hospital lab), Hosp Order     Status: None   Collection Time: 10/09/18  5:41 AM   Specimen: Nasopharyngeal Swab  Result Value Ref  Range Status   SARS Coronavirus 2 NEGATIVE NEGATIVE Final    Comment: (NOTE) If result is NEGATIVE SARS-CoV-2 target nucleic acids are NOT DETECTED. The SARS-CoV-2 RNA is generally detectable in upper and lower  respiratory specimens during the acute phase of infection. The lowest  concentration of SARS-CoV-2 viral copies this assay can detect is 250  copies / mL. A negative result does not preclude SARS-CoV-2 infection  and should not be used as the sole basis for treatment or other  patient management decisions.  A negative result may occur with  improper specimen collection / handling, submission of specimen other  than nasopharyngeal swab, presence of viral mutation(s) within the  areas targeted by this assay, and inadequate number of viral copies  (<250 copies / mL). A negative result must be combined with clinical  observations, patient history, and epidemiological information. If result is POSITIVE SARS-CoV-2 target nucleic acids are DETECTED. The SARS-CoV-2 RNA is generally  detectable in upper and lower  respiratory specimens dur ing the acute phase of infection.  Positive  results are indicative of active infection with SARS-CoV-2.  Clinical  correlation with patient history and other diagnostic information is  necessary to determine patient infection status.  Positive results do  not rule out bacterial infection or co-infection with other viruses. If result is PRESUMPTIVE POSTIVE SARS-CoV-2 nucleic acids MAY BE PRESENT.   A presumptive positive result was obtained on the submitted specimen  and confirmed on repeat testing.  While 2019 novel coronavirus  (SARS-CoV-2) nucleic acids may be present in the submitted sample  additional confirmatory testing may be necessary for epidemiological  and / or clinical management purposes  to differentiate between  SARS-CoV-2 and other Sarbecovirus currently known to infect humans.  If clinically indicated additional testing with an  alternate test  methodology [phone]) is advised. The SARS-CoV-2 RNA is generally  detectable in upper and lower respiratory sp ecimens during the acute  phase of infection. The expected result is Negative. Fact Sheet for Patients:  StrictlyIdeas.no Fact Sheet for Healthcare Providers: BankingDealers.co.za This test is not yet approved or cleared by the [place] FDA and has been authorized for detection and/or diagnosis of SARS-CoV-2 by FDA under an Emergency Use Authorization (EUA).  This EUA will remain in effect (meaning this test can be used) for the duration of the COVID-19 declaration under Section 564(b)(1) of the Act, 21 U.S.C. section 360bbb-3(b)(1), unless the authorization is terminated or revoked sooner. Performed at [facility], [address] [place], [place] [address]      Radiology Studies: Dg Abdomen Acute W/chest  Result Date: 10/09/2018 CLINICAL DATA:  Nausea, generalized weakness, hyperglycemia EXAM: DG ABDOMEN ACUTE W/ 1V CHEST COMPARISON:  CT abdomen/pelvis dated 10/11/2017 FINDINGS: Lungs are essentially clear. No focal consolidation. No pleural effusion or pneumothorax. The heart is top-normal in size.  Thoracic aortic atherosclerosis. Nonobstructive bowel gas pattern.  Moderate rectal stool burden. Vascular calcifications.  Surgical clips in the pelvis. No evidence of free air under the diaphragm on the upright view. IMPRESSION: No evidence of acute cardiopulmonary disease. No evidence of small bowel obstruction or free air. Electronically Signed   By: Julian Hy M.D.   On: 10/09/2018 03:40    Scheduled Meds: . enoxaparin (LOVENOX) injection  40 mg Subcutaneous Q24H  . feeding supplement (ENSURE ENLIVE)  237 mL Oral BID BM  . metoprolol succinate  50 mg Oral Daily  . multivitamin with minerals  1 tablet Oral Daily  . rivastigmine  3 mg Oral BID   Continuous Infusions: . sodium chloride 75 mL/hr at  10/10/18 0018  . cefTRIAXone (ROCEPHIN)  IV 1 g (10/10/18 0404)     LOS: 1 day    Time spent:30 minutes.    Barton Dubois, MD [facility] Pager [phone]   10/10/2018, 5:19 PM

## 2018-10-10 NOTE — TOC Initial Note (Signed)
Transition of Care [facility]) - Initial/Assessment Note    Patient Details  Name: Theresa Hunt MRN: [id number] Date of Birth: 1930-02-26  [facility] CM/SW Contact:    Ihor Gully, LCSW Phone Number: 10/10/2018, 6:07 PM  Clinical Narrative:                 Patient lives alone. Her children are supportive.  She has a urostomy bag that she cares for. Daughter, Ms. Priddy, is the main care giver. She is receiving treatment for breast cancer at [facility] via chemo pill.  She states that patient's mentation appears to be worsening and care is becoming increasingly difficult recently. Ms. Erskin Burnet endorses that patient has a UTI and this could be contributing to her decline in mentation.  LCSW discussed patient's PT evaluation and recommendation.  It was discussed that patient patient's Medicare will only pay for a skilled need.  Education on what skilled needs are typically SNF appropriate. [facility] services were also discussed as it was the recommendation. Ms. Erskin Burnet states that she will call LCSW tomorrow with her two brothers so we can have a conference call to discuss options.   Expected Discharge Plan: [place] Barriers to Discharge: No Barriers Identified   Patient Goals and CMS Choice Patient states their goals for this hospitalization and ongoing recovery are:: ongoing support      Expected Discharge Plan and Services Expected Discharge Plan: [place]       Living arrangements for the past 2 months: Single Family Home                                      Prior Living Arrangements/Services Living arrangements for the past 2 months: Single Family Home Lives with:: Self Patient language and need for interpreter reviewed:: Yes Do you feel safe going back to the place where you live?: Yes   n/a  Need for Family Participation in Patient Care: Yes (Comment) Care giver support system in place?: Yes (comment)   Criminal Activity/Legal  Involvement Pertinent to Current Situation/Hospitalization: No - Comment as needed  Activities of Daily Living Home Assistive Devices/Equipment: None ADL Screening (condition at time of admission) Patient's cognitive ability adequate to safely complete daily activities?: Yes Is the patient deaf or have difficulty hearing?: No Does the patient have difficulty seeing, even when wearing glasses/contacts?: No Does the patient have difficulty concentrating, remembering, or making decisions?: Yes Patient able to express need for assistance with ADLs?: Yes Does the patient have difficulty dressing or bathing?: No Independently performs ADLs?: Yes (appropriate for developmental age) Does the patient have difficulty walking or climbing stairs?: Yes Weakness of Legs: Both Weakness of Arms/Hands: Both  Permission Sought/Granted Permission sought to share information with : Family Supports          Permission granted to share info w Relationship: daughter, Joya San     Emotional Assessment Appearance:: Appears stated age   Affect (typically observed): Unable to Assess Orientation: : Oriented to Self Alcohol / Substance Use: Not Applicable Psych Involvement: No (comment)  Admission diagnosis:  Hypokalemia [E87.6] Nausea [R11.0] Weakness [R53.1] Fall in home, initial encounter [W19.Merril Abbe, G66.599] Patient Active Problem List   Diagnosis Date Noted  . UTI (urinary tract infection) 10/09/2018  . Interstitial cystitis   . SVT (supraventricular tachycardia) ([place]) 09/06/2012   PCP:  [name], MD Pharmacy:  [facility], [place] [address] Phone: [phone] Fax: [phone]     Social Determinants of Health (SDOH) Interventions    Readmission Risk Interventions No flowsheet data found.

## 2018-10-11 DIAGNOSIS — I10 Essential (primary) hypertension: Secondary | ICD-10-CM

## 2018-10-11 LAB — URINE CULTURE
Culture: >=100000 — AB
Special Requests: NORMAL

## 2018-10-11 MED ORDER — CEFAZOLIN SODIUM-DEXTROSE 2-4 GM/100ML-% IV SOLN
2.0000 g | Freq: Three times a day (TID) | INTRAVENOUS | Status: DC
  Administered 2018-10-11 – 2018-10-12 (×3): 2 g via INTRAVENOUS
  Filled 2018-10-11 (×3): qty 100

## 2018-10-11 MED ORDER — POLYETHYLENE GLYCOL 3350 17 G PO PACK: 17.0000 g | PACK | Freq: Every day | ORAL | Status: DC | PRN

## 2018-10-11 MED ORDER — SENNOSIDES-DOCUSATE SODIUM 8.6-50 MG PO TABS: 2.0000 | ORAL_TABLET | Freq: Every evening | ORAL | Status: DC | PRN

## 2018-10-11 NOTE — Progress Notes (Signed)
PROGRESS NOTE    Theresa Hunt  WHQ:[id number] DOB: 1930-02-23 DOA: 10/09/2018 PCP: [name], MD   Brief Narrative:  85 year old with history of dementia, interstitial cystitis status post urostomy, SVT, essential hypertension, hyperlipidemia brought for generalized weakness to the hospital.  She was found to have urinary tract infection.   Assessment & Plan:   Active Problems:   UTI (urinary tract infection)  Urinary tract infection, gram-negative rods - On IV Rocephin, will switch to IV Ancef.  Then switch to oral when she is able to consistently tolerate oral -IV fluids supportive care  Dementia without any behavioral disturbances -Resume home meds.  History of SVT Essential hypertension -Continue metoprolol  Physical deconditioning Last patient lives alone at home.  Physical therapy recommends home health.  Orders will be placed.  DVT prophylaxis: Lovenox Code Status: Full code Family Communication: Spoke with patient's son over the phone maintain Disposition Plan: Maintain hospital stay for at least 24 hours for IV antibiotics until she is able to tolerate oral consistently Maintain Consultants:   None  Procedures:   None  Antimicrobials:   And self   Subjective: Slight nausea but feels better.  Review of Systems Otherwise negative except as per HPI, including: General: Denies fever, chills, night sweats or unintended weight loss. Resp: Denies cough, wheezing, shortness of breath. Cardiac: Denies chest pain, palpitations, orthopnea, paroxysmal nocturnal dyspnea. GI: Denies abdominal pain, nausea, vomiting, diarrhea or constipation GU: Denies dysuria, frequency, hesitancy or incontinence MS: Denies muscle aches, joint pain or swelling Neuro: Denies headache, neurologic deficits (focal weakness, numbness, tingling), abnormal gait Psych: Denies anxiety, depression, SI/HI/AVH Skin: Denies new rashes or lesions ID: Denies sick contacts, exotic  exposures, travel  Objective: Vitals:   10/10/18 2119 10/11/18 0551 10/11/18 0656 10/11/18 1500  BP: (!) 161/90 (!) 143/90  (!) 145/87  Pulse: 83 (!) 122 77 89  Resp: 18 16  19   Temp: 99.6 F (37.6 C) 98.3 F (36.8 C)  98.9 F (37.2 C)  TempSrc: Oral Oral  Oral  SpO2: 98% 97%  98%  Weight:      Height:        Intake/Output Summary (Last 24 hours) at 10/11/2018 1617 Last data filed at 10/11/2018 1507 Gross per 24 hour  Intake 1185.58 ml  Output 2975 ml  Net -1789.42 ml   Filed Weights   10/09/18 0142  Weight: 59 kg    Examination:  General exam: Appears calm and comfortable  Respiratory system: Clear to auscultation. Respiratory effort normal. Cardiovascular system: S1 & S2 heard, RRR. No JVD, murmurs, rubs, gallops or clicks. No pedal edema. Gastrointestinal system: Abdomen is nondistended, soft and nontender. No organomegaly or masses felt. Normal bowel sounds heard. Central nervous system: Alert and oriented. No focal neurological deficits. Extremities: Symmetric 4 x 5 power. Skin: No rashes, lesions or ulcers Psychiatry: Judgement and insight appear normal. Mood & affect appropriate.     Data Reviewed:   CBC: Recent Labs  Lab 10/09/18 0224 10/10/18 0531  WBC 10.9* 11.8*  NEUTROABS 10.3*  --   HGB 13.7 12.0  HCT 41.0 36.5  MCV 90.5 91.9  PLT 119* 659*   Basic Metabolic Panel: Recent Labs  Lab 10/09/18 0224 10/10/18 0531  NA 133* 139  K 3.0* 4.2  CL 100 108  CO2 23 24  GLUCOSE 218* 127*  BUN 25* 24*  CREATININE 0.91 0.85  CALCIUM 9.7 8.7*   GFR: Estimated Creatinine Clearance: 42 mL/min (by C-G formula based on SCr of 0.85  mg/dL). Liver Function Tests: Recent Labs  Lab 10/09/18 0224 10/10/18 0531  AST 40 27  ALT 31 24  ALKPHOS 96 86  BILITOT 1.8* 0.7  PROT 6.7 5.4*  ALBUMIN 3.6 2.8*   Recent Labs  Lab 10/09/18 0224  LIPASE 22   No results for input(s): AMMONIA in the last 168 hours. Coagulation Profile: No results for  input(s): INR, PROTIME in the last 168 hours. Cardiac Enzymes: No results for input(s): CKTOTAL, CKMB, CKMBINDEX, TROPONINI in the last 168 hours. BNP (last 3 results) No results for input(s): PROBNP in the last 8760 hours. HbA1C: No results for input(s): HGBA1C in the last 72 hours. CBG: No results for input(s): GLUCAP in the last 168 hours. Lipid Profile: No results for input(s): CHOL, HDL, LDLCALC, TRIG, CHOLHDL, LDLDIRECT in the last 72 hours. Thyroid Function Tests: No results for input(s): TSH, T4TOTAL, FREET4, T3FREE, THYROIDAB in the last 72 hours. Anemia Panel: No results for input(s): VITAMINB12, FOLATE, FERRITIN, TIBC, IRON, RETICCTPCT in the last 72 hours. Sepsis Labs: No results for input(s): PROCALCITON, LATICACIDVEN in the last 168 hours.  Recent Results (from the past 240 hour(s))  Urine culture     Status: Abnormal   Collection Time: 10/09/18  3:13 AM   Specimen: Urine, Catheterized  Result Value Ref Range Status   Specimen Description   Final    URINE, CATHETERIZED Performed at [facility], [address], [place], [place] [address]    Special Requests   Final    Normal Performed at [facility], [address], [place], [place] [address]    Culture >=100,000 COLONIES/mL ESCHERICHIA COLI (A)  Final   Report Status 10/11/2018 FINAL  Final   Organism ID, Bacteria ESCHERICHIA COLI (A)  Final      Susceptibility   Escherichia coli - MIC*    AMPICILLIN >=32 RESISTANT Resistant     CEFAZOLIN <=4 SENSITIVE Sensitive     CEFTRIAXONE <=1 SENSITIVE Sensitive     CIPROFLOXACIN <=0.25 SENSITIVE Sensitive     GENTAMICIN <=1 SENSITIVE Sensitive     IMIPENEM <=0.25 SENSITIVE Sensitive     NITROFURANTOIN <=16 SENSITIVE Sensitive     TRIMETH/SULFA >=320 RESISTANT Resistant     AMPICILLIN/SULBACTAM 16 INTERMEDIATE Intermediate     PIP/TAZO <=4 SENSITIVE Sensitive     Extended ESBL NEGATIVE Sensitive     * >=100,000 COLONIES/mL ESCHERICHIA COLI  SARS Coronavirus 2  (CEPHEID - Performed in [place] hospital lab), Hosp Order     Status: None   Collection Time: 10/09/18  5:41 AM   Specimen: Nasopharyngeal Swab  Result Value Ref Range Status   SARS Coronavirus 2 NEGATIVE NEGATIVE Final    Comment: (NOTE) If result is NEGATIVE SARS-CoV-2 target nucleic acids are NOT DETECTED. The SARS-CoV-2 RNA is generally detectable in upper and lower  respiratory specimens during the acute phase of infection. The lowest  concentration of SARS-CoV-2 viral copies this assay can detect is 250  copies / mL. A negative result does not preclude SARS-CoV-2 infection  and should not be used as the sole basis for treatment or other  patient management decisions.  A negative result may occur with  improper specimen collection / handling, submission of specimen other  than nasopharyngeal swab, presence of viral mutation(s) within the  areas targeted by this assay, and inadequate number of viral copies  (<250 copies / mL). A negative result must be combined with clinical  observations, patient history, and epidemiological information. If result is POSITIVE SARS-CoV-2 target nucleic  acids are DETECTED. The SARS-CoV-2 RNA is generally detectable in upper and lower  respiratory specimens dur ing the acute phase of infection.  Positive  results are indicative of active infection with SARS-CoV-2.  Clinical  correlation with patient history and other diagnostic information is  necessary to determine patient infection status.  Positive results do  not rule out bacterial infection or co-infection with other viruses. If result is PRESUMPTIVE POSTIVE SARS-CoV-2 nucleic acids MAY BE PRESENT.   A presumptive positive result was obtained on the submitted specimen  and confirmed on repeat testing.  While 2019 novel coronavirus  (SARS-CoV-2) nucleic acids may be present in the submitted sample  additional confirmatory testing may be necessary for epidemiological  and / or clinical  management purposes  to differentiate between  SARS-CoV-2 and other Sarbecovirus currently known to infect humans.  If clinically indicated additional testing with an alternate test  methodology [phone]) is advised. The SARS-CoV-2 RNA is generally  detectable in upper and lower respiratory sp ecimens during the acute  phase of infection. The expected result is Negative. Fact Sheet for Patients:  StrictlyIdeas.no Fact Sheet for Healthcare Providers: BankingDealers.co.za This test is not yet approved or cleared by the [place] FDA and has been authorized for detection and/or diagnosis of SARS-CoV-2 by FDA under an Emergency Use Authorization (EUA).  This EUA will remain in effect (meaning this test can be used) for the duration of the COVID-19 declaration under Section 564(b)(1) of the Act, 21 U.S.C. section 360bbb-3(b)(1), unless the authorization is terminated or revoked sooner. Performed at [facility], [address] [place],  [address]          Radiology Studies: No results found.      Scheduled Meds: . enoxaparin (LOVENOX) injection  40 mg Subcutaneous Q24H  . feeding supplement (ENSURE ENLIVE)  237 mL Oral BID BM  . metoprolol succinate  50 mg Oral Daily  . multivitamin with minerals  1 tablet Oral Daily  . rivastigmine  3 mg Oral BID   Continuous Infusions: . sodium chloride 75 mL/hr at 10/10/18 2122  . cefTRIAXone (ROCEPHIN)  IV 1 g (10/11/18 0518)     LOS: 2 days   Time spent= 25 mins    Cashis Rill Arsenio Loader, MD [facility]  If 7PM-7AM, please contact night-coverage [email or web address] 10/11/2018, 4:17 PM

## 2018-10-11 NOTE — Progress Notes (Signed)
Pharmacy Antibiotic Note  Theresa Hunt is a 85 y.o. female admitted on 10/09/2018 with UTI.  Pharmacy has been consulted for cefazolin dosing.  Plan: Ancef 2gm IV q8h  Height: 5\' 5"  (165.1 cm) Weight: 130 lb (59 kg) IBW/kg (Calculated) : 57    Temp (24hrs), Avg:98.9 F (37.2 C), Min:98.3 F (36.8 C), Max:99.6 F (37.6 C)  Recent Labs  Lab 10/09/18 0224 10/10/18 0531  WBC 10.9* 11.8*  CREATININE 0.91 0.85    Estimated Creatinine Clearance: 42 mL/min (by C-G formula based on SCr of 0.85 mg/dL).    Allergies  Allergen Reactions  . Codeine Other (See Comments)    Pt. does not remember    Antimicrobials this admission: 7/29 cefazolin >>  Microbiology results: 7/29 UCx: e coli >100,000   Thank you for allowing pharmacy to be a part of this patient's care.  Donna Christen Christan Ciccarelli 10/11/2018 4:27 PM

## 2018-10-11 NOTE — TOC Progression Note (Signed)
Transition of Care [facility]) - Progression Note    Patient Details  Name: Theresa Hunt MRN: [id number] Date of Birth: March 01, 1930  Transition of [facility]) CM/SW Contact  Ihor Gully, LCSW Phone Number: 10/11/2018, 3:18 PM  Clinical Narrative:    Conference call with all three of patient's children to discuss discharge planning.  PT evaluation and recommendations were discussed. Family discussed that patient is increasingly difficult to manage due to her dementia (wandering, reduced decision making). Children indicate that they have families and are unable to stay with patient 24/7. LCSW discussed that patient does not have a skilled need and is in need of LTC and would need LTC Medicaid, private pay options were also discussed. Family indicates that patient is not able to privately pay. LCSW discussed the need to begin the LTC Medicaid application as soon as possible and provide the number to [facility] DSS and name of Adult Medicaid worker Tressie Stalker. Family indicated that they would call DSS now.  Second call with family after family spoke with Tressie Stalker.  Patient does not qualify for special assistance Medicaid but would qualify for LTC Medicaid after the family handled some issues surrounding life insurance policies with cash value.   Tressie Stalker confirmed that she had spoken with family about what needed to happen for patient with financial documents to receive LTC Medicaid.  Referral made to [facility] with RN, PT, SW and aide.      Expected Discharge Plan: [place] Barriers to Discharge: No Barriers Identified  Expected Discharge Plan and Services Expected Discharge Plan: [place] arrangements for the past 2 months: Single Family Home                                       Social Determinants of Health (SDOH) Interventions    Readmission Risk Interventions No flowsheet data found.

## 2018-10-11 NOTE — Plan of Care (Signed)

## 2018-10-12 ENCOUNTER — Telehealth: Payer: Self-pay | Admitting: Family Medicine

## 2018-10-12 DIAGNOSIS — R5381 Other malaise: Secondary | ICD-10-CM

## 2018-10-12 LAB — BASIC METABOLIC PANEL
Anion gap: 8 (ref 5–15)
BUN: 15 mg/dL (ref 8–23)
CO2: 24 mmol/L (ref 22–32)
Calcium: 8.6 mg/dL — ABNORMAL LOW (ref 8.9–10.3)
Chloride: 107 mmol/L (ref 98–111)
Creatinine, Ser: 0.65 mg/dL (ref 0.44–1.00)
GFR calc Af Amer: >60 mL/min (ref >60)
GFR calc non Af Amer: >60 mL/min (ref >60)
Glucose, Bld: 128 mg/dL — ABNORMAL HIGH (ref 70–99)
Potassium: 3.4 mmol/L — ABNORMAL LOW (ref 3.5–5.1)
Sodium: 139 mmol/L (ref 135–145)

## 2018-10-12 LAB — MAGNESIUM: Magnesium: 2.1 mg/dL (ref 1.7–2.4)

## 2018-10-12 MED ORDER — POTASSIUM CHLORIDE CRYS ER 20 MEQ PO TBCR
40.0000 meq | EXTENDED_RELEASE_TABLET | Freq: Once | ORAL | Status: AC
  Administered 2018-10-12: 10:00:00 40 meq via ORAL
  Filled 2018-10-12: qty 2

## 2018-10-12 MED ORDER — CEPHALEXIN 250 MG PO CAPS: 250.0000 mg | ORAL_CAPSULE | Freq: Four times a day (QID) | ORAL | 0 refills | Status: AC

## 2018-10-12 NOTE — Discharge Summary (Signed)
Physician Discharge Summary  OLUWATOBI RUPPE WGY:[id number] DOB: May 21, 1929 DOA: 10/09/2018  PCP: [name], MD  Admit date: 10/09/2018 Discharge date: 10/12/2018  Admitted From: Home Disposition: Home with home health  Recommendations for Outpatient Follow-up:  1. Follow up with PCP in 1-2 weeks 2. Please obtain BMP/CBC in one week your next doctors visit.  3. Take oral Keflex 4 times daily for 5 days  Home Health: PT/aide/social worker/RN Equipment/Devices: None Discharge Condition: Stable CODE STATUS: Full code Diet recommendation: Heart healthy  Brief/Interim Summary: 85 year old with history of dementia, interstitial cystitis status post urostomy, SVT, essential hypertension, hyperlipidemia brought for generalized weakness to the hospital.  She was found to have urinary tract infection.  Her urine culture ended up growing E. coli which was pansensitive, Rocephin was switched to Ancef and eventually Keflex to be taken for 5 more days at the time of discharge.  She was seen by physical therapy recommended home health therefore arrangements were made. Stable for discharge today.   Discharge Diagnoses:  Active Problems:   UTI (urinary tract infection)   Urinary tract infection with E. coli without hematuria -  Transition patient from IV Ancef to oral Keflex for 5 more days.  Sensitivities reviewed.  Dementia without any behavioral disturbances -Resume home meds.  History of SVT Essential hypertension -Continue metoprolol  Physical deconditioning Physical therapy is recommending home health therefore arrangements made.  Long-term patient may require to be placed at a nursing home or higher level of care.  Therefore social worker should follow her outpatient  Consultations:  None  Subjective: Feels okay, no complaints.  Wants to go home.  Discharge Exam: Vitals:   10/11/18 2136 10/12/18 0534  BP: (!) 181/98 (!) 154/96  Pulse: 81 (!) 129  Resp: 18 16  Temp: 98.8  F (37.1 C) 97.9 F (36.6 C)  SpO2: 97% 96%   Vitals:   10/11/18 1500 10/11/18 1955 10/11/18 2136 10/12/18 0534  BP: (!) 145/87  (!) 181/98 (!) 154/96  Pulse: 89  81 (!) 129  Resp: 19  18 16   Temp: 98.9 F (37.2 C)  98.8 F (37.1 C) 97.9 F (36.6 C)  TempSrc: Oral  Oral Oral  SpO2: 98% 98% 97% 96%  Weight:      Height:        General: Pt is alert, awake, not in acute distress Cardiovascular: RRR, S1/S2 +, no rubs, no gallops Respiratory: CTA bilaterally, no wheezing, no rhonchi Abdominal: Soft, NT, ND, bowel sounds + Extremities: no edema, no cyanosis  Discharge Instructions  Discharge Instructions    Call MD for:  persistant dizziness or light-headedness   Complete by: As directed    Call MD for:  temperature >100.4   Complete by: As directed    Diet - low sodium heart healthy   Complete by: As directed    Discharge instructions   Complete by: As directed    You were cared for by a hospitalist during your hospital stay. If you have any questions about your discharge medications or the care you received while you were in the hospital after you are discharged, you can call the unit and asked to speak with the hospitalist on call if the hospitalist that took care of you is not available. Once you are discharged, your primary care physician will handle any further medical issues. Please note that NO REFILLS for any discharge medications will be authorized once you are discharged, as it is imperative that you return to your primary care  physician (or establish a relationship with a primary care physician if you do not have one) for your aftercare needs so that they can reassess your need for medications and monitor your lab values.  Please request your Prim.MD to go over all Hospital Tests and Procedure/Radiological results at the follow up, please get all Hospital records sent to your Prim MD by signing hospital release before you go home.  Get CBC, CMP, 2 view Chest X ray  checked  by Primary MD during your next visit or SNF MD in 5-7 days ( we routinely change or add medications that can affect your baseline labs and fluid status, therefore we recommend that you get the mentioned basic workup next visit with your PCP, your PCP may decide not to get them or add new tests based on their clinical decision)  On your next visit with your primary care physician please Get Medicines reviewed and adjusted.  If you experience worsening of your admission symptoms, develop shortness of breath, life threatening emergency, suicidal or homicidal thoughts you must seek medical attention immediately by calling 911 or calling your MD immediately  if symptoms less severe.  You Must read complete instructions/literature along with all the possible adverse reactions/side effects for all the Medicines you take and that have been prescribed to you. Take any new Medicines after you have completely understood and accpet all the possible adverse reactions/side effects.   Do not drive, operate heavy machinery, perform activities at heights, swimming or participation in water activities or provide baby sitting services if your were admitted for syncope or siezures until you have seen by Primary MD or a Neurologist and advised to do so again.  Do not drive when taking Pain medications.   Increase activity slowly   Complete by: As directed      Allergies as of 10/12/2018      Reactions   Codeine Other (See Comments)   Pt. does not remember      Medication List    TAKE these medications   aspirin 81 MG tablet Take 81 mg by mouth daily.   cephALEXin 250 MG capsule Commonly known as: KEFLEX Take 1 capsule (250 mg total) by mouth 4 (four) times daily for 5 days.   fish oil-omega-3 fatty acids 1000 MG capsule Take 1 g by mouth daily.   fluticasone 50 MCG/ACT nasal spray Commonly known as: FLONASE Place 2 sprays into the nose daily.   MAGNESIUM PO Take 250 mg by mouth daily.    metoprolol succinate 50 MG 24 hr tablet Commonly known as: TOPROL-XL TAKE ONE (1) TABLET EACH DAY   rivastigmine 3 MG capsule Commonly known as: Exelon Take 1 capsule (3 mg total) by mouth 2 (two) times daily.   VITAMIN B 12 PO Take by mouth. Reported on 06/18/2015   Vitamin D3 75 MCG (3000 UT) Tabs Take 2,000 Units by mouth.      Follow-up Information    Stacks, Cletus Gash, MD. Schedule an appointment as soon as possible for a visit in 1 week(s).   Specialty: Family Medicine Contact information: [place] [address] [place] Follow up.   Why: home health   Contact information: [address] [place] [address] [phone]         Allergies  Allergen Reactions  . Codeine Other (See Comments)    Pt. does not remember    You were cared for  by a hospitalist during your hospital stay. If you have any questions about your discharge medications or the care you received while you were in the hospital after you are discharged, you can call the unit and asked to speak with the hospitalist on call if the hospitalist that took care of you is not available. Once you are discharged, your primary care physician will handle any further medical issues. Please note that no refills for any discharge medications will be authorized once you are discharged, as it is imperative that you return to your primary care physician (or establish a relationship with a primary care physician if you do not have one) for your aftercare needs so that they can reassess your need for medications and monitor your lab values.   Procedures/Studies: Dg Abdomen Acute W/chest  Result Date: 10/09/2018 CLINICAL DATA:  Nausea, generalized weakness, hyperglycemia EXAM: DG ABDOMEN ACUTE W/ 1V CHEST COMPARISON:  CT abdomen/pelvis dated 10/11/2017 FINDINGS: Lungs are essentially clear. No focal consolidation. No pleural effusion or pneumothorax. The heart  is top-normal in size.  Thoracic aortic atherosclerosis. Nonobstructive bowel gas pattern.  Moderate rectal stool burden. Vascular calcifications.  Surgical clips in the pelvis. No evidence of free air under the diaphragm on the upright view. IMPRESSION: No evidence of acute cardiopulmonary disease. No evidence of small bowel obstruction or free air. Electronically Signed   By: Julian Hy M.D.   On: 10/09/2018 03:40      The results of significant diagnostics from this hospitalization (including imaging, microbiology, ancillary and laboratory) are listed below for reference.     Microbiology: Recent Results (from the past 240 hour(s))  Urine culture     Status: Abnormal   Collection Time: 10/09/18  3:13 AM   Specimen: Urine, Catheterized  Result Value Ref Range Status   Specimen Description   Final    URINE, CATHETERIZED Performed at [facility], [address] [place], [place] [address]    Special Requests   Final    Normal Performed at [facility], [address] [place], [place] [address]    Culture >=100,000 COLONIES/mL ESCHERICHIA COLI (A)  Final   Report Status 10/11/2018 FINAL  Final   Organism ID, Bacteria ESCHERICHIA COLI (A)  Final      Susceptibility   Escherichia coli - MIC*    AMPICILLIN >=32 RESISTANT Resistant     CEFAZOLIN <=4 SENSITIVE Sensitive     CEFTRIAXONE <=1 SENSITIVE Sensitive     CIPROFLOXACIN <=0.25 SENSITIVE Sensitive     GENTAMICIN <=1 SENSITIVE Sensitive     IMIPENEM <=0.25 SENSITIVE Sensitive     NITROFURANTOIN <=16 SENSITIVE Sensitive     TRIMETH/SULFA >=320 RESISTANT Resistant     AMPICILLIN/SULBACTAM 16 INTERMEDIATE Intermediate     PIP/TAZO <=4 SENSITIVE Sensitive     Extended ESBL NEGATIVE Sensitive     * >=100,000 COLONIES/mL ESCHERICHIA COLI  SARS Coronavirus 2 (CEPHEID - Performed in [place] hospital lab), Hosp Order     Status: None   Collection Time: 10/09/18  5:41 AM   Specimen: Nasopharyngeal Swab  Result Value Ref  Range Status   SARS Coronavirus 2 NEGATIVE NEGATIVE Final    Comment: (NOTE) If result is NEGATIVE SARS-CoV-2 target nucleic acids are NOT DETECTED. The SARS-CoV-2 RNA is generally detectable in upper and lower  respiratory specimens during the acute phase of infection. The lowest  concentration of SARS-CoV-2 viral copies this assay can detect is 250  copies / mL. A negative result does not preclude SARS-CoV-2  infection  and should not be used as the sole basis for treatment or other  patient management decisions.  A negative result may occur with  improper specimen collection / handling, submission of specimen other  than nasopharyngeal swab, presence of viral mutation(s) within the  areas targeted by this assay, and inadequate number of viral copies  (<250 copies / mL). A negative result must be combined with clinical  observations, patient history, and epidemiological information. If result is POSITIVE SARS-CoV-2 target nucleic acids are DETECTED. The SARS-CoV-2 RNA is generally detectable in upper and lower  respiratory specimens dur ing the acute phase of infection.  Positive  results are indicative of active infection with SARS-CoV-2.  Clinical  correlation with patient history and other diagnostic information is  necessary to determine patient infection status.  Positive results do  not rule out bacterial infection or co-infection with other viruses. If result is PRESUMPTIVE POSTIVE SARS-CoV-2 nucleic acids MAY BE PRESENT.   A presumptive positive result was obtained on the submitted specimen  and confirmed on repeat testing.  While 2019 novel coronavirus  (SARS-CoV-2) nucleic acids may be present in the submitted sample  additional confirmatory testing may be necessary for epidemiological  and / or clinical management purposes  to differentiate between  SARS-CoV-2 and other Sarbecovirus currently known to infect humans.  If clinically indicated additional testing with an  alternate test  methodology [phone]) is advised. The SARS-CoV-2 RNA is generally  detectable in upper and lower respiratory sp ecimens during the acute  phase of infection. The expected result is Negative. Fact Sheet for Patients:  StrictlyIdeas.no Fact Sheet for Healthcare Providers: BankingDealers.co.za This test is not yet approved or cleared by the [place] FDA and has been authorized for detection and/or diagnosis of SARS-CoV-2 by FDA under an Emergency Use Authorization (EUA).  This EUA will remain in effect (meaning this test can be used) for the duration of the COVID-19 declaration under Section 564(b)(1) of the Act, 21 U.S.C. section 360bbb-3(b)(1), unless the authorization is terminated or revoked sooner. Performed at [facility], [address] [place], [place] [address]      Labs: BNP (last 3 results) No results for input(s): BNP in the last 8760 hours. Basic Metabolic Panel: Recent Labs  Lab 10/09/18 0224 10/10/18 0531 10/12/18 0523  NA 133* 139 139  K 3.0* 4.2 3.4*  CL 100 108 107  CO2 23 24 24   GLUCOSE 218* 127* 128*  BUN 25* 24* 15  CREATININE 0.91 0.85 0.65  CALCIUM 9.7 8.7* 8.6*  MG  --   --  2.1   Liver Function Tests: Recent Labs  Lab 10/09/18 0224 10/10/18 0531  AST 40 27  ALT 31 24  ALKPHOS 96 86  BILITOT 1.8* 0.7  PROT 6.7 5.4*  ALBUMIN 3.6 2.8*   Recent Labs  Lab 10/09/18 0224  LIPASE 22   No results for input(s): AMMONIA in the last 168 hours. CBC: Recent Labs  Lab 10/09/18 0224 10/10/18 0531  WBC 10.9* 11.8*  NEUTROABS 10.3*  --   HGB 13.7 12.0  HCT 41.0 36.5  MCV 90.5 91.9  PLT 119* 101*   Cardiac Enzymes: No results for input(s): CKTOTAL, CKMB, CKMBINDEX, TROPONINI in the last 168 hours. BNP: Invalid input(s): POCBNP CBG: No results for input(s): GLUCAP in the last 168 hours. D-Dimer No results for input(s): DDIMER in the last 72 hours. Hgb A1c No results for  input(s): HGBA1C in the last 72 hours. Lipid Profile No results for  input(s): CHOL, HDL, LDLCALC, TRIG, CHOLHDL, LDLDIRECT in the last 72 hours. Thyroid function studies No results for input(s): TSH, T4TOTAL, T3FREE, THYROIDAB in the last 72 hours.  Invalid input(s): FREET3 Anemia work up No results for input(s): VITAMINB12, FOLATE, FERRITIN, TIBC, IRON, RETICCTPCT in the last 72 hours. Urinalysis    Component Value Date/Time   COLORURINE YELLOW 10/09/2018 0313   APPEARANCEUR HAZY (A) 10/09/2018 0313   APPEARANCEUR Cloudy (A) 09/13/2018 1414   LABSPEC 1.008 10/09/2018 0313   PHURINE 8.0 10/09/2018 0313   GLUCOSEU NEGATIVE 10/09/2018 0313   HGBUR LARGE (A) 10/09/2018 0313   BILIRUBINUR NEGATIVE 10/09/2018 0313   BILIRUBINUR Negative 09/13/2018 1414   KETONESUR NEGATIVE 10/09/2018 0313   PROTEINUR 100 (A) 10/09/2018 0313   NITRITE NEGATIVE 10/09/2018 0313   LEUKOCYTESUR LARGE (A) 10/09/2018 0313   Sepsis Labs Invalid input(s): PROCALCITONIN,  WBC,  LACTICIDVEN Microbiology Recent Results (from the past 240 hour(s))  Urine culture     Status: Abnormal   Collection Time: 10/09/18  3:13 AM   Specimen: Urine, Catheterized  Result Value Ref Range Status   Specimen Description   Final    URINE, CATHETERIZED Performed at [facility], [address], [place], [place] [address]    Special Requests   Final    Normal Performed at [facility], [address], [place], [place] [address]    Culture >=100,000 COLONIES/mL ESCHERICHIA COLI (A)  Final   Report Status 10/11/2018 FINAL  Final   Organism ID, Bacteria ESCHERICHIA COLI (A)  Final      Susceptibility   Escherichia coli - MIC*    AMPICILLIN >=32 RESISTANT Resistant     CEFAZOLIN <=4 SENSITIVE Sensitive     CEFTRIAXONE <=1 SENSITIVE Sensitive     CIPROFLOXACIN <=0.25 SENSITIVE Sensitive     GENTAMICIN <=1 SENSITIVE Sensitive     IMIPENEM <=0.25 SENSITIVE Sensitive     NITROFURANTOIN <=16 SENSITIVE Sensitive      TRIMETH/SULFA >=320 RESISTANT Resistant     AMPICILLIN/SULBACTAM 16 INTERMEDIATE Intermediate     PIP/TAZO <=4 SENSITIVE Sensitive     Extended ESBL NEGATIVE Sensitive     * >=100,000 COLONIES/mL ESCHERICHIA COLI  SARS Coronavirus 2 (CEPHEID - Performed in [place] hospital lab), Hosp Order     Status: None   Collection Time: 10/09/18  5:41 AM   Specimen: Nasopharyngeal Swab  Result Value Ref Range Status   SARS Coronavirus 2 NEGATIVE NEGATIVE Final    Comment: (NOTE) If result is NEGATIVE SARS-CoV-2 target nucleic acids are NOT DETECTED. The SARS-CoV-2 RNA is generally detectable in upper and lower  respiratory specimens during the acute phase of infection. The lowest  concentration of SARS-CoV-2 viral copies this assay can detect is 250  copies / mL. A negative result does not preclude SARS-CoV-2 infection  and should not be used as the sole basis for treatment or other  patient management decisions.  A negative result may occur with  improper specimen collection / handling, submission of specimen other  than nasopharyngeal swab, presence of viral mutation(s) within the  areas targeted by this assay, and inadequate number of viral copies  (<250 copies / mL). A negative result must be combined with clinical  observations, patient history, and epidemiological information. If result is POSITIVE SARS-CoV-2 target nucleic acids are DETECTED. The SARS-CoV-2 RNA is generally detectable in upper and lower  respiratory specimens dur ing the acute phase of infection.  Positive  results are indicative of active infection with SARS-CoV-2.  Clinical  correlation with patient history and other diagnostic information is  necessary to determine patient infection status.  Positive results do  not rule out bacterial infection or co-infection with other viruses. If result is PRESUMPTIVE POSTIVE SARS-CoV-2 nucleic acids MAY BE PRESENT.   A presumptive positive result was obtained on the submitted  specimen  and confirmed on repeat testing.  While 2019 novel coronavirus  (SARS-CoV-2) nucleic acids may be present in the submitted sample  additional confirmatory testing may be necessary for epidemiological  and / or clinical management purposes  to differentiate between  SARS-CoV-2 and other Sarbecovirus currently known to infect humans.  If clinically indicated additional testing with an alternate test  methodology [id number]) is advised. The SARS-CoV-2 RNA is generally  detectable in upper and lower respiratory sp ecimens during the acute  phase of infection. The expected result is Negative. Fact Sheet for Patients:  StrictlyIdeas.no Fact Sheet for Healthcare Providers: BankingDealers.co.za This test is not yet approved or cleared by the [place] FDA and has been authorized for detection and/or diagnosis of SARS-CoV-2 by FDA under an Emergency Use Authorization (EUA).  This EUA will remain in effect (meaning this test can be used) for the duration of the COVID-19 declaration under Section 564(b)(1) of the Act, 21 U.S.C. section 360bbb-3(b)(1), unless the authorization is terminated or revoked sooner. Performed at [facility], [address], [place], [place] [address]      Time coordinating discharge:  I have spent 35 minutes face to face with the patient and on the ward discussing the patients care, assessment, plan and disposition with other care givers. >50% of the time was devoted counseling the patient about the risks and benefits of treatment/Discharge disposition and coordinating care.   SIGNED:   Damita Lack, MD  [facility] 10/12/2018, 11:26 AM   If 7PM-7AM, please contact night-coverage [email or web address]

## 2018-10-12 NOTE — Progress Notes (Signed)
Discharge instructions reviewed with patient's son [name] at patient's request. Verbalized understanding of instructions. HH arranged by CSW with [facility], patient and son aware. Copy of AVS to be sent with patient at discharge. Reviewed f/u, home medications, where to pick up prescription, when to seek medical attention with her son. IV site d/c'd, site within normal limits. Discussed fall prevention/safety precautions at home with her son as well, verbalized understanding of need for someone to provide assistance as needed due to weakness. Pt in stable condition awaiting her son's arrival for discharge home. Donavan Foil, RN

## 2018-10-12 NOTE — Progress Notes (Signed)
Patient left unit in stable condition via w/c accompanied by nurse tech for discharge home. Picked up by her son. Donavan Foil, RN

## 2018-10-12 NOTE — Telephone Encounter (Signed)
appt made.  Daughter aware

## 2018-10-13 DIAGNOSIS — B9620 Unspecified Escherichia coli [E. coli] as the cause of diseases classified elsewhere: Secondary | ICD-10-CM | POA: Diagnosis not present

## 2018-10-13 DIAGNOSIS — N390 Urinary tract infection, site not specified: Secondary | ICD-10-CM | POA: Diagnosis not present

## 2018-10-13 DIAGNOSIS — R69 Illness, unspecified: Secondary | ICD-10-CM | POA: Diagnosis not present

## 2018-10-13 DIAGNOSIS — E785 Hyperlipidemia, unspecified: Secondary | ICD-10-CM | POA: Diagnosis not present

## 2018-10-13 DIAGNOSIS — Z7982 Long term (current) use of aspirin: Secondary | ICD-10-CM | POA: Diagnosis not present

## 2018-10-13 DIAGNOSIS — Z936 Other artificial openings of urinary tract status: Secondary | ICD-10-CM | POA: Diagnosis not present

## 2018-10-13 DIAGNOSIS — I10 Essential (primary) hypertension: Secondary | ICD-10-CM | POA: Diagnosis not present

## 2018-10-13 DIAGNOSIS — I471 Supraventricular tachycardia: Secondary | ICD-10-CM | POA: Diagnosis not present

## 2018-10-16 DIAGNOSIS — N390 Urinary tract infection, site not specified: Secondary | ICD-10-CM | POA: Diagnosis not present

## 2018-10-16 DIAGNOSIS — E785 Hyperlipidemia, unspecified: Secondary | ICD-10-CM | POA: Diagnosis not present

## 2018-10-16 DIAGNOSIS — Z7982 Long term (current) use of aspirin: Secondary | ICD-10-CM | POA: Diagnosis not present

## 2018-10-16 DIAGNOSIS — I10 Essential (primary) hypertension: Secondary | ICD-10-CM | POA: Diagnosis not present

## 2018-10-16 DIAGNOSIS — B9620 Unspecified Escherichia coli [E. coli] as the cause of diseases classified elsewhere: Secondary | ICD-10-CM | POA: Diagnosis not present

## 2018-10-16 DIAGNOSIS — Z936 Other artificial openings of urinary tract status: Secondary | ICD-10-CM | POA: Diagnosis not present

## 2018-10-16 DIAGNOSIS — R69 Illness, unspecified: Secondary | ICD-10-CM | POA: Diagnosis not present

## 2018-10-16 DIAGNOSIS — I471 Supraventricular tachycardia: Secondary | ICD-10-CM | POA: Diagnosis not present

## 2018-10-17 DIAGNOSIS — N390 Urinary tract infection, site not specified: Secondary | ICD-10-CM | POA: Diagnosis not present

## 2018-10-17 DIAGNOSIS — I471 Supraventricular tachycardia: Secondary | ICD-10-CM | POA: Diagnosis not present

## 2018-10-17 DIAGNOSIS — B9620 Unspecified Escherichia coli [E. coli] as the cause of diseases classified elsewhere: Secondary | ICD-10-CM | POA: Diagnosis not present

## 2018-10-17 DIAGNOSIS — Z7982 Long term (current) use of aspirin: Secondary | ICD-10-CM | POA: Diagnosis not present

## 2018-10-17 DIAGNOSIS — I10 Essential (primary) hypertension: Secondary | ICD-10-CM | POA: Diagnosis not present

## 2018-10-17 DIAGNOSIS — Z936 Other artificial openings of urinary tract status: Secondary | ICD-10-CM | POA: Diagnosis not present

## 2018-10-17 DIAGNOSIS — E785 Hyperlipidemia, unspecified: Secondary | ICD-10-CM | POA: Diagnosis not present

## 2018-10-17 DIAGNOSIS — R69 Illness, unspecified: Secondary | ICD-10-CM | POA: Diagnosis not present

## 2018-10-18 ENCOUNTER — Telehealth: Payer: Self-pay | Admitting: Family Medicine

## 2018-10-18 ENCOUNTER — Other Ambulatory Visit: Payer: Self-pay

## 2018-10-18 DIAGNOSIS — Z7982 Long term (current) use of aspirin: Secondary | ICD-10-CM | POA: Diagnosis not present

## 2018-10-18 DIAGNOSIS — E785 Hyperlipidemia, unspecified: Secondary | ICD-10-CM | POA: Diagnosis not present

## 2018-10-18 DIAGNOSIS — B9620 Unspecified Escherichia coli [E. coli] as the cause of diseases classified elsewhere: Secondary | ICD-10-CM | POA: Diagnosis not present

## 2018-10-18 DIAGNOSIS — Z936 Other artificial openings of urinary tract status: Secondary | ICD-10-CM | POA: Diagnosis not present

## 2018-10-18 DIAGNOSIS — I471 Supraventricular tachycardia: Secondary | ICD-10-CM | POA: Diagnosis not present

## 2018-10-18 DIAGNOSIS — R69 Illness, unspecified: Secondary | ICD-10-CM | POA: Diagnosis not present

## 2018-10-18 DIAGNOSIS — N390 Urinary tract infection, site not specified: Secondary | ICD-10-CM | POA: Diagnosis not present

## 2018-10-18 DIAGNOSIS — I10 Essential (primary) hypertension: Secondary | ICD-10-CM | POA: Diagnosis not present

## 2018-10-18 NOTE — Telephone Encounter (Signed)
Patient coming in for appt tomorrow. Her daughter is coming with her. She wanted to let you know a few things before the appt because she didn't want to talk about them in front of her mom. They are considering putting her in a skilled nursing facility. They haven't discussed with mother yet. The daughter is the POA. Mother has dementia and it is getting worse. She wandered outside on July 27th at night time and fell. She has a life alert so they were able to get to her. Daughter and her brothers are trying to stay with her as much as possible but its become too much to handle. She wanders to the neighbors house at night time and brings them candy and magazines. They are bringing an FL2 form that has been filled out by [facility] but level of care needs to be changed from assisted living to SNF. Just an [place]

## 2018-10-19 ENCOUNTER — Encounter: Payer: Self-pay | Admitting: Family Medicine

## 2018-10-19 ENCOUNTER — Ambulatory Visit (INDEPENDENT_AMBULATORY_CARE_PROVIDER_SITE_OTHER): Payer: Medicare HMO | Admitting: Family Medicine

## 2018-10-19 VITALS — BP 158/82 | HR 76 | Temp 98.4°F | Ht 64.0 in | Wt 138.0 lb

## 2018-10-19 DIAGNOSIS — F0390 Unspecified dementia without behavioral disturbance: Secondary | ICD-10-CM

## 2018-10-19 DIAGNOSIS — R5383 Other fatigue: Secondary | ICD-10-CM

## 2018-10-19 DIAGNOSIS — R69 Illness, unspecified: Secondary | ICD-10-CM | POA: Diagnosis not present

## 2018-10-19 DIAGNOSIS — R410 Disorientation, unspecified: Secondary | ICD-10-CM | POA: Diagnosis not present

## 2018-10-19 DIAGNOSIS — Z7689 Persons encountering health services in other specified circumstances: Secondary | ICD-10-CM | POA: Diagnosis not present

## 2018-10-19 NOTE — Progress Notes (Signed)
Subjective:  Patient ID: Theresa Hunt, female    DOB: 01-25-1930  Age: 85 y.o. MRN: [id number]  CC: Hospitalization Follow-up   HPI EMAYA PRESTON presents for follow-up on her dementia.  Recent events have led both her son and daughter to determine they could no longer care for her in the home. They are arranging placement in [place]. She was in last month for evaluation and I prescribed Exelon for her.  However, they could not afford the patch.  I sent in the capsule for replacement and for some reason that was never picked up.  The patient therefore has not been tried on that medicine.  However, they are concerned about cost and also tolerance and she cannot take donepezil.  Her daughter brings in a letter stating that she has been very confused she stares and cries more than usual.  She has lost interest in her usual activities including sewing and knitting and cooking.  She forgets what she is doing.  She recently left the sugar sugar water on the stove and forgot about it and walked off.  She wears the same close for several days at a time unless the family lays them out for her  Depression screen [facility] 2/9 10/19/2018 09/13/2018  Decreased Interest 1 0  Down, Depressed, Hopeless 1 0  PHQ - 2 Score 2 0  Altered sleeping 0 -  Tired, decreased energy 0 -  Change in appetite 1 -  Feeling bad or failure about yourself  0 -  Trouble concentrating 1 -  Moving slowly or fidgety/restless 1 -  Suicidal thoughts 0 -  PHQ-9 Score 5 -  Difficult doing work/chores Somewhat difficult -    History Theresa Hunt has a past medical history of Hyperlipidemia, Hypertension, Interstitial cystitis, and SVT (supraventricular tachycardia) ([place]).   She has a past surgical history that includes Abdominal hysterectomy; Cholecystectomy; and Revision urostomy cutaneous.   Her family history includes CAD (age of onset: 54) in her mother; Cancer in her daughter; Emphysema in her father.She reports that  she has never smoked. She has never used smokeless tobacco. She reports that she does not drink alcohol or use drugs.    ROS Review of Systems  Constitutional: Negative.   HENT: Negative for congestion.   Eyes: Negative for visual disturbance.  Respiratory: Negative for shortness of breath.   Cardiovascular: Negative for chest pain.  Gastrointestinal: Negative for abdominal pain, constipation, diarrhea, nausea and vomiting.  Genitourinary: Negative for difficulty urinating.  Musculoskeletal: Negative for arthralgias and myalgias.  Neurological: Negative for headaches.  Psychiatric/Behavioral: Positive for behavioral problems, confusion, decreased concentration and sleep disturbance.    Objective:  BP (!) 158/82   Pulse 76   Temp 98.4 F (36.9 C) (Oral)   Ht '5\' 4"'$  (1.626 m)   Wt 138 lb (62.6 kg)   BMI 23.69 kg/m   BP Readings from Last 3 Encounters:  10/19/18 (!) 158/82  10/12/18 (!) 154/96  10/02/18 (!) 148/73    Wt Readings from Last 3 Encounters:  10/19/18 138 lb (62.6 kg)  10/09/18 130 lb (59 kg)  10/02/18 142 lb (64.4 kg)     Physical Exam Constitutional:      General: She is not in acute distress.    Appearance: She is well-developed.  Cardiovascular:     Rate and Rhythm: Normal rate and regular rhythm.  Pulmonary:     Breath sounds: Normal breath sounds.  Skin:    General: Skin is warm  and dry.  Neurological:     General: No focal deficit present.     Mental Status: She is alert.  Psychiatric:        Attention and Perception: She is inattentive.        Mood and Affect: Mood normal.        Speech: Speech is delayed.        Behavior: Behavior normal.        Cognition and Memory: Cognition is impaired. Memory is impaired.       Assessment & Plan:   Theresa Hunt was seen today for hospitalization follow-up.  Diagnoses and all orders for this visit:  Dementia without behavioral disturbance, unspecified dementia type (HCC) -     TSH -     T4, Free   Fatigue, unspecified type -     CBC with Differential/Platelet -     CMP14+EGFR       I have discontinued Franne Grip. Larch's rivastigmine. I am also having her maintain her MAGNESIUM PO, Cyanocobalamin (VITAMIN B 12 PO), aspirin, fish oil-omega-3 fatty acids, Vitamin D3, fluticasone, and metoprolol succinate.  Allergies as of 10/19/2018      Reactions   Codeine Other (See Comments)   Pt. does not remember      Medication List       Accurate as of October 19, 2018 11:59 PM. If you have any questions, ask your nurse or doctor.        STOP taking these medications   rivastigmine 3 MG capsule Commonly known as: Exelon Stopped by: [name], MD     TAKE these medications   aspirin 81 MG tablet Take 81 mg by mouth daily.   fish oil-omega-3 fatty acids 1000 MG capsule Take 1 g by mouth daily.   fluticasone 50 MCG/ACT nasal spray Commonly known as: FLONASE Place 2 sprays into the nose daily.   MAGNESIUM PO Take 250 mg by mouth daily.   metoprolol succinate 50 MG 24 hr tablet Commonly known as: TOPROL-XL TAKE ONE (1) TABLET EACH DAY   VITAMIN B 12 PO Take by mouth. Reported on 06/18/2015   Vitamin D3 75 MCG (3000 UT) Tabs Take 2,000 Units by mouth.        Follow-up: No follow-ups on file.  [name], M.D.

## 2018-10-20 ENCOUNTER — Ambulatory Visit (INDEPENDENT_AMBULATORY_CARE_PROVIDER_SITE_OTHER): Payer: Medicare HMO | Admitting: Urology

## 2018-10-20 DIAGNOSIS — N281 Cyst of kidney, acquired: Secondary | ICD-10-CM | POA: Diagnosis not present

## 2018-10-20 DIAGNOSIS — N200 Calculus of kidney: Secondary | ICD-10-CM

## 2018-10-20 DIAGNOSIS — N3021 Other chronic cystitis with hematuria: Secondary | ICD-10-CM

## 2018-10-20 DIAGNOSIS — Z936 Other artificial openings of urinary tract status: Secondary | ICD-10-CM

## 2018-10-20 LAB — CMP14+EGFR
ALT: 22 IU/L (ref 0–32)
AST: 26 IU/L (ref 0–40)
Albumin/Globulin Ratio: 1.4 (ref 1.2–2.2)
Albumin: 3.8 g/dL (ref 3.6–4.6)
Alkaline Phosphatase: 107 IU/L (ref 39–117)
BUN/Creatinine Ratio: 22 (ref 12–28)
BUN: 16 mg/dL (ref 8–27)
Bilirubin Total: 0.3 mg/dL (ref 0.0–1.2)
CO2: 27 mmol/L (ref 20–29)
Calcium: 10.2 mg/dL (ref 8.7–10.3)
Chloride: 98 mmol/L (ref 96–106)
Creatinine, Ser: 0.73 mg/dL (ref 0.57–1.00)
GFR calc Af Amer: 86 mL/min/{1.73_m2} (ref >59)
GFR calc non Af Amer: 74 mL/min/{1.73_m2} (ref >59)
Globulin, Total: 2.7 g/dL (ref 1.5–4.5)
Glucose: 140 mg/dL — ABNORMAL HIGH (ref 65–99)
Potassium: 4.4 mmol/L (ref 3.5–5.2)
Sodium: 138 mmol/L (ref 134–144)
Total Protein: 6.5 g/dL (ref 6.0–8.5)

## 2018-10-20 LAB — CBC WITH DIFFERENTIAL/PLATELET
Basophils Absolute: 0 10*3/uL (ref 0.0–0.2)
Basos: 1 %
EOS (ABSOLUTE): 0.1 10*3/uL (ref 0.0–0.4)
Eos: 1 %
Hematocrit: 38.7 % (ref 34.0–46.6)
Hemoglobin: 13 g/dL (ref 11.1–15.9)
Immature Grans (Abs): 0 10*3/uL (ref 0.0–0.1)
Immature Granulocytes: 1 %
Lymphocytes Absolute: 1.3 10*3/uL (ref 0.7–3.1)
Lymphs: 18 %
MCH: 29.9 pg (ref 26.6–33.0)
MCHC: 33.6 g/dL (ref 31.5–35.7)
MCV: 89 fL (ref 79–97)
Monocytes Absolute: 0.6 10*3/uL (ref 0.1–0.9)
Monocytes: 8 %
Neutrophils Absolute: 5.3 10*3/uL (ref 1.4–7.0)
Neutrophils: 71 %
Platelets: 217 10*3/uL (ref 150–450)
RBC: 4.35 x10E6/uL (ref 3.77–5.28)
RDW: 12.4 % (ref 11.7–15.4)
WBC: 7.4 10*3/uL (ref 3.4–10.8)

## 2018-10-20 LAB — T4, FREE: Free T4: 1.36 ng/dL (ref 0.82–1.77)

## 2018-10-20 LAB — TSH: TSH: 1.11 u[IU]/mL (ref 0.450–4.500)

## 2018-10-22 ENCOUNTER — Encounter: Payer: Self-pay | Admitting: Family Medicine

## 2018-10-23 DIAGNOSIS — R69 Illness, unspecified: Secondary | ICD-10-CM | POA: Diagnosis not present

## 2018-10-23 DIAGNOSIS — B9620 Unspecified Escherichia coli [E. coli] as the cause of diseases classified elsewhere: Secondary | ICD-10-CM | POA: Diagnosis not present

## 2018-10-23 DIAGNOSIS — I10 Essential (primary) hypertension: Secondary | ICD-10-CM | POA: Diagnosis not present

## 2018-10-23 DIAGNOSIS — I471 Supraventricular tachycardia: Secondary | ICD-10-CM | POA: Diagnosis not present

## 2018-10-23 DIAGNOSIS — N390 Urinary tract infection, site not specified: Secondary | ICD-10-CM | POA: Diagnosis not present

## 2018-10-23 DIAGNOSIS — E785 Hyperlipidemia, unspecified: Secondary | ICD-10-CM | POA: Diagnosis not present

## 2018-10-23 DIAGNOSIS — Z936 Other artificial openings of urinary tract status: Secondary | ICD-10-CM | POA: Diagnosis not present

## 2018-10-23 DIAGNOSIS — Z7982 Long term (current) use of aspirin: Secondary | ICD-10-CM | POA: Diagnosis not present

## 2018-10-24 DIAGNOSIS — I10 Essential (primary) hypertension: Secondary | ICD-10-CM | POA: Diagnosis not present

## 2018-10-24 DIAGNOSIS — I471 Supraventricular tachycardia: Secondary | ICD-10-CM | POA: Diagnosis not present

## 2018-10-24 DIAGNOSIS — Z7982 Long term (current) use of aspirin: Secondary | ICD-10-CM | POA: Diagnosis not present

## 2018-10-24 DIAGNOSIS — B9620 Unspecified Escherichia coli [E. coli] as the cause of diseases classified elsewhere: Secondary | ICD-10-CM | POA: Diagnosis not present

## 2018-10-24 DIAGNOSIS — Z936 Other artificial openings of urinary tract status: Secondary | ICD-10-CM | POA: Diagnosis not present

## 2018-10-24 DIAGNOSIS — N390 Urinary tract infection, site not specified: Secondary | ICD-10-CM | POA: Diagnosis not present

## 2018-10-24 DIAGNOSIS — E785 Hyperlipidemia, unspecified: Secondary | ICD-10-CM | POA: Diagnosis not present

## 2018-10-24 DIAGNOSIS — R69 Illness, unspecified: Secondary | ICD-10-CM | POA: Diagnosis not present

## 2018-10-25 ENCOUNTER — Other Ambulatory Visit: Payer: Self-pay | Admitting: Family Medicine

## 2018-10-25 LAB — HGB A1C W/O EAG: Hgb A1c MFr Bld: 6.7 % — ABNORMAL HIGH (ref 4.8–5.6)

## 2018-10-25 LAB — SPECIMEN STATUS REPORT

## 2018-10-25 MED ORDER — METFORMIN HCL 500 MG PO TABS: 500.0000 mg | ORAL_TABLET | Freq: Every day | ORAL | 2 refills | Status: DC

## 2018-10-28 DIAGNOSIS — Z7982 Long term (current) use of aspirin: Secondary | ICD-10-CM | POA: Diagnosis not present

## 2018-10-28 DIAGNOSIS — E785 Hyperlipidemia, unspecified: Secondary | ICD-10-CM | POA: Diagnosis not present

## 2018-10-28 DIAGNOSIS — I471 Supraventricular tachycardia: Secondary | ICD-10-CM | POA: Diagnosis not present

## 2018-10-28 DIAGNOSIS — I10 Essential (primary) hypertension: Secondary | ICD-10-CM | POA: Diagnosis not present

## 2018-10-28 DIAGNOSIS — N390 Urinary tract infection, site not specified: Secondary | ICD-10-CM | POA: Diagnosis not present

## 2018-10-28 DIAGNOSIS — R69 Illness, unspecified: Secondary | ICD-10-CM | POA: Diagnosis not present

## 2018-10-28 DIAGNOSIS — B9620 Unspecified Escherichia coli [E. coli] as the cause of diseases classified elsewhere: Secondary | ICD-10-CM | POA: Diagnosis not present

## 2018-10-28 DIAGNOSIS — Z936 Other artificial openings of urinary tract status: Secondary | ICD-10-CM | POA: Diagnosis not present

## 2018-11-07 ENCOUNTER — Ambulatory Visit (INDEPENDENT_AMBULATORY_CARE_PROVIDER_SITE_OTHER): Payer: Medicare HMO

## 2018-11-07 ENCOUNTER — Other Ambulatory Visit: Payer: Self-pay

## 2018-11-07 DIAGNOSIS — I471 Supraventricular tachycardia: Secondary | ICD-10-CM | POA: Diagnosis not present

## 2018-11-07 DIAGNOSIS — Z936 Other artificial openings of urinary tract status: Secondary | ICD-10-CM

## 2018-11-07 DIAGNOSIS — N390 Urinary tract infection, site not specified: Secondary | ICD-10-CM

## 2018-11-07 DIAGNOSIS — R69 Illness, unspecified: Secondary | ICD-10-CM | POA: Diagnosis not present

## 2018-11-07 DIAGNOSIS — B9620 Unspecified Escherichia coli [E. coli] as the cause of diseases classified elsewhere: Secondary | ICD-10-CM

## 2018-11-07 DIAGNOSIS — Z7982 Long term (current) use of aspirin: Secondary | ICD-10-CM

## 2018-11-07 DIAGNOSIS — I10 Essential (primary) hypertension: Secondary | ICD-10-CM

## 2018-11-07 DIAGNOSIS — F0391 Unspecified dementia with behavioral disturbance: Secondary | ICD-10-CM | POA: Diagnosis not present

## 2018-11-07 DIAGNOSIS — E785 Hyperlipidemia, unspecified: Secondary | ICD-10-CM

## 2018-11-08 ENCOUNTER — Telehealth: Payer: Self-pay | Admitting: Family Medicine

## 2018-11-08 NOTE — Telephone Encounter (Signed)
All she wants to do is sleep the last few days. She nods and dozes all day long. Pt is belching a lot in the AM and doesn't want to eat the last few days. Daughter wants to know if the metformin or the memory meds could be causing. Daughters says she seems worse and she is very quiet. Should the meds be changed or should she stop diabetic meds because she is not eating. She forgets to eat.

## 2018-11-08 NOTE — Telephone Encounter (Signed)
Spoke with patients daughter and she had just got on the road. She was uncomfortable driving and talking and asked that we call her back in about 30 minutes

## 2018-11-09 NOTE — Telephone Encounter (Signed)
Patients daughter notified and verbalized understanding 

## 2018-11-09 NOTE — Telephone Encounter (Signed)
Please contact the patient Have them stop the rivastimine (exelon) Check blood sugar frequentlt. If falling below 100 hey should stop the metformin

## 2018-12-04 ENCOUNTER — Other Ambulatory Visit: Payer: Self-pay

## 2018-12-05 ENCOUNTER — Ambulatory Visit (INDEPENDENT_AMBULATORY_CARE_PROVIDER_SITE_OTHER): Payer: Medicare HMO | Admitting: Family Medicine

## 2018-12-05 ENCOUNTER — Encounter: Payer: Self-pay | Admitting: Family Medicine

## 2018-12-05 VITALS — BP 119/74 | HR 76 | Temp 97.3°F | Resp 18 | Ht 64.0 in | Wt 135.0 lb

## 2018-12-05 DIAGNOSIS — Z932 Ileostomy status: Secondary | ICD-10-CM

## 2018-12-05 DIAGNOSIS — R69 Illness, unspecified: Secondary | ICD-10-CM | POA: Diagnosis not present

## 2018-12-05 DIAGNOSIS — E119 Type 2 diabetes mellitus without complications: Secondary | ICD-10-CM

## 2018-12-05 DIAGNOSIS — F0390 Unspecified dementia without behavioral disturbance: Secondary | ICD-10-CM

## 2018-12-05 NOTE — Patient Instructions (Signed)
Continue current meds, except Discontinue Exelon

## 2018-12-05 NOTE — Progress Notes (Signed)
Subjective:  Patient ID: Theresa Hunt, female    DOB: 05/31/1929  Age: 85 y.o. MRN: [id number]  CC: Medical Management of Chronic Issues (6 week follow up - dementia)   HPI Theresa Hunt presents for poor tolerance of exelon. Stopped after a week of use due to hallucinations. Previously tried two meds that weren't tolerated either. Currently Theresa Hunt is stable. Daughter says Theresa Hunt has moments of confusion.   Depression screen [facility] 2/9 12/05/2018 10/19/2018 09/13/2018  Decreased Interest 0 1 0  Down, Depressed, Hopeless 0 1 0  PHQ - 2 Score 0 2 0  Altered sleeping - 0 -  Tired, decreased energy - 0 -  Change in appetite - 1 -  Feeling bad or failure about yourself  - 0 -  Trouble concentrating - 1 -  Moving slowly or fidgety/restless - 1 -  Suicidal thoughts - 0 -  PHQ-9 Score - 5 -  Difficult doing work/chores - Somewhat difficult -    History Theresa Hunt has a past medical history of Hyperlipidemia, Hypertension, Interstitial cystitis, and SVT (supraventricular tachycardia) ([place]).   Theresa Hunt has a past surgical history that includes Abdominal hysterectomy; Cholecystectomy; and Revision urostomy cutaneous.   Her family history includes CAD (age of onset: 2) in her mother; Cancer in her daughter; Emphysema in her father.Theresa Hunt reports that Theresa Hunt has never smoked. Theresa Hunt has never used smokeless tobacco. Theresa Hunt reports that Theresa Hunt does not drink alcohol or use drugs.    ROS Review of Systems  Constitutional: Negative.   HENT: Negative.   Eyes: Negative for visual disturbance.  Respiratory: Negative for shortness of breath.   Cardiovascular: Negative for chest pain.  Gastrointestinal: Negative for abdominal pain.  Musculoskeletal: Negative for arthralgias.  Psychiatric/Behavioral: Positive for confusion and decreased concentration. Negative for agitation and dysphoric mood.    Objective:  BP 119/74   Pulse 76   Temp (!) 97.3 F (36.3 C)   Resp 18   Ht 5\' 4"  ([id number] m)   Wt 135 lb (61.2 kg)   SpO2 96%    BMI 23.17 kg/m   BP Readings from Last 3 Encounters:  12/05/18 119/74  10/19/18 (!) 158/82  10/12/18 (!) 154/96    Wt Readings from Last 3 Encounters:  12/05/18 135 lb (61.2 kg)  10/19/18 138 lb (62.6 kg)  10/09/18 130 lb (59 kg)     Physical Exam Psychiatric:        Attention and Perception: Attention normal.        Mood and Affect: Mood normal.        Speech: Speech normal.        Behavior: Behavior normal. Behavior is cooperative.        Thought Content: Thought content normal. Thought content is not delusional.        Cognition and Memory: Cognition is impaired. Memory is impaired.        Judgment: Judgment is not impulsive or inappropriate.       Assessment & Plan:   Theresa Hunt was seen today for medical management of chronic issues.  Diagnoses and all orders for this visit:  Dementia without behavioral disturbance, unspecified dementia type ([place])  Controlled type 2 diabetes mellitus without complication, without long-term current use of insulin ([place])  Ileostomy status ([place])       I am having Theresa Hunt. Mcewen maintain her MAGNESIUM PO, Cyanocobalamin (VITAMIN B 12 PO), aspirin, fish oil-omega-3 fatty acids, Vitamin D3, fluticasone, metoprolol succinate, and metFORMIN.  Allergies as of 12/05/2018  Reactions   Codeine Other (See Comments)   Pt. does not remember      Medication List       Accurate as of December 05, 2018  5:44 PM. If you have any questions, ask your nurse or doctor.        aspirin 81 MG tablet Take 81 mg by mouth daily.   fish oil-omega-3 fatty acids 1000 MG capsule Take 1 g by mouth daily.   fluticasone 50 MCG/ACT nasal spray Commonly known as: FLONASE Place 2 sprays into the nose daily.   MAGNESIUM PO Take 250 mg by mouth daily.   metFORMIN 500 MG tablet Commonly known as: GLUCOPHAGE Take 1 tablet (500 mg total) by mouth daily.   metoprolol succinate 50 MG 24 hr tablet Commonly known as: TOPROL-XL TAKE ONE (1) TABLET  EACH DAY   VITAMIN B 12 PO Take by mouth. Reported on 06/18/2015   Vitamin D3 75 MCG (3000 UT) Tabs Take 2,000 Units by mouth.      Pt. Showing no signs of dementia today. Will monitor. Daughter will report new symptoms.  Follow-up: Return in about 3 months (around 03/06/2019).  [name], M.D.

## 2019-01-03 ENCOUNTER — Other Ambulatory Visit: Payer: Self-pay | Admitting: Family Medicine

## 2019-01-03 DIAGNOSIS — Z936 Other artificial openings of urinary tract status: Secondary | ICD-10-CM | POA: Diagnosis not present

## 2019-01-22 DIAGNOSIS — Z936 Other artificial openings of urinary tract status: Secondary | ICD-10-CM | POA: Diagnosis not present

## 2019-03-05 ENCOUNTER — Ambulatory Visit: Payer: Medicare HMO | Admitting: Family Medicine

## 2019-03-27 DIAGNOSIS — Z936 Other artificial openings of urinary tract status: Secondary | ICD-10-CM | POA: Diagnosis not present

## 2019-03-29 ENCOUNTER — Ambulatory Visit: Payer: Medicare HMO

## 2019-04-02 ENCOUNTER — Encounter: Payer: Self-pay | Admitting: Urology

## 2019-04-05 DIAGNOSIS — Z936 Other artificial openings of urinary tract status: Secondary | ICD-10-CM | POA: Diagnosis not present

## 2019-04-06 ENCOUNTER — Encounter: Payer: Self-pay | Admitting: Urology

## 2019-04-06 ENCOUNTER — Other Ambulatory Visit: Payer: Self-pay | Admitting: Family Medicine

## 2019-04-06 ENCOUNTER — Ambulatory Visit: Payer: Medicare HMO | Admitting: Urology

## 2019-04-06 ENCOUNTER — Other Ambulatory Visit: Payer: Self-pay

## 2019-04-06 ENCOUNTER — Ambulatory Visit (INDEPENDENT_AMBULATORY_CARE_PROVIDER_SITE_OTHER): Payer: Medicare HMO | Admitting: Urology

## 2019-04-06 VITALS — BP 138/67 | HR 94 | Temp 97.3°F | Ht 65.0 in | Wt 137.0 lb

## 2019-04-06 DIAGNOSIS — Z87440 Personal history of urinary (tract) infections: Secondary | ICD-10-CM | POA: Insufficient documentation

## 2019-04-06 DIAGNOSIS — N200 Calculus of kidney: Secondary | ICD-10-CM | POA: Diagnosis not present

## 2019-04-06 DIAGNOSIS — Z906 Acquired absence of other parts of urinary tract: Secondary | ICD-10-CM | POA: Insufficient documentation

## 2019-04-06 DIAGNOSIS — T85858D Stenosis due to other internal prosthetic devices, implants and grafts, subsequent encounter: Secondary | ICD-10-CM

## 2019-04-06 NOTE — Progress Notes (Signed)
Subjective:  10/20/18: Pt has a hx of cystectomy for IC. She has had issues with stomal stenosis and has had an has had some recurrent UTI's in the past and has had a prior stomal revision.  She was noted to have possible recurrent stomal stenosis by the home aide but there is no issue with the drainage.  There is an odor but the urine is generally clear.  She has progressive dementia. Her CT scan in July 2019 showed a 6.24mm right renal stone and a tiny left renal stone. She has stable renal cysts and no findings of hydro or loop dilation. there was no peristomal hernia. She has no other associated signs or symptoms.         ROS:  ROS:  A complete review of systems was performed.  All systems are negative except for pertinent findings as noted.   Review of Systems  Gastrointestinal: Negative for abdominal pain.  Genitourinary: Negative for hematuria.  Neurological:       Progressive memory loss.     Allergies  Allergen Reactions  . Codeine Other (See Comments)    Pt. does not remember    Outpatient Encounter Medications as of 04/06/2019  Medication Sig  . aspirin 81 MG tablet Take 81 mg by mouth daily.  . Cholecalciferol (VITAMIN D3) 3000 UNITS TABS Take 2,000 Units by mouth.  . fish oil-omega-3 fatty acids 1000 MG capsule Take 1 g by mouth daily.  . fluticasone (FLONASE) 50 MCG/ACT nasal spray Place 2 sprays into the nose daily.  Marland Kitchen MAGNESIUM PO Take 250 mg by mouth daily.   . metFORMIN (GLUCOPHAGE) 500 MG tablet TAKE ONE (1) TABLET EACH DAY  . metoprolol succinate (TOPROL-XL) 50 MG 24 hr tablet TAKE ONE (1) TABLET EACH DAY  . Cyanocobalamin (VITAMIN B 12 PO) Take by mouth. Reported on 06/18/2015   No facility-administered encounter medications on file as of 04/06/2019.    Past Medical History:  Diagnosis Date  . Hyperlipidemia   . Hypertension   . Interstitial cystitis   . SVT (supraventricular tachycardia) (HCC)     Past Surgical History:  Procedure Laterality Date  .  ABDOMINAL HYSTERECTOMY    . CHOLECYSTECTOMY    . CYSTECTOMY W/ URETEROILEAL CONDUIT    . REVISION UROSTOMY CUTANEOUS      Social History   Socioeconomic History  . Marital status: Widowed    Spouse name: Not on file  . Number of children: 3  . Years of education: Not on file  . Highest education level: Not on file  Occupational History  . Not on file  Tobacco Use  . Smoking status: Never Smoker  . Smokeless tobacco: Never Used  Substance and Sexual Activity  . Alcohol use: No  . Drug use: No  . Sexual activity: Not on file  Other Topics Concern  . Not on file  Social History Narrative   Lives alone.    Social Determinants of Health   Financial Resource Strain:   . Difficulty of Paying Living Expenses: Not on file  Food Insecurity:   . Worried About Charity fundraiser in the Last Year: Not on file  . Ran Out of Food in the Last Year: Not on file  Transportation Needs:   . Lack of Transportation (Medical): Not on file  . Lack of Transportation (Non-Medical): Not on file  Physical Activity:   . Days of Exercise per Week: Not on file  . Minutes of Exercise per Session: Not on  file  Stress:   . Feeling of Stress : Not on file  Social Connections:   . Frequency of Communication with Friends and Family: Not on file  . Frequency of Social Gatherings with Friends and Family: Not on file  . Attends Religious Services: Not on file  . Active Member of Clubs or Organizations: Not on file  . Attends Archivist Meetings: Not on file  . Marital Status: Not on file  Intimate Partner Violence:   . Fear of Current or Ex-Partner: Not on file  . Emotionally Abused: Not on file  . Physically Abused: Not on file  . Sexually Abused: Not on file    Family History  Problem Relation Age of Onset  . Emphysema Father   . CAD Mother 35  . Cancer Daughter        Breast       Objective: Vitals:   04/06/19 1032  BP: 138/67  Pulse: 94  Temp: (!) 97.3 F (36.3 C)      Physical Exam Vitals reviewed.  Constitutional:      Appearance: Normal appearance.  Abdominal:     General: Abdomen is flat.     Palpations: Abdomen is soft.     Comments: She has a RLQ stoma with mild stenosis.   Neurological:     Mental Status: She is alert.     Lab Results:  No results found for this or any previous visit (from the past 24 hour(s)).  BMET  Results for Theresa Hunt, Theresa Hunt (MRN [id number]) as of 04/06/2019 10:58  Ref. Range 10/19/2018 14:37  Sodium Latest Ref Range: 134 - 144 mmol/L 138  Potassium Latest Ref Range: 3.5 - 5.2 mmol/L 4.4  Chloride Latest Ref Range: 96 - 106 mmol/L 98  CO2 Latest Ref Range: 20 - 29 mmol/L 27  Glucose Latest Ref Range: 65 - 99 mg/dL 140 (H)  BUN Latest Ref Range: 8 - 27 mg/dL 16  Creatinine Latest Ref Range: 0.57 - 1.00 mg/dL 0.73  Calcium Latest Ref Range: 8.7 - 10.3 mg/dL 10.2     Studies/Results: KUB in 7/20 showed no obvious stones.      Assessment & Plan: Interstitial cystitis with prior cystectomy.  She is doing well and has only mild stoma stenosis.     Nephrolithiasis and renal cysts.   She is due for upper tract imaging and I will get a CT stone study.      No orders of the defined types were placed in this encounter.    Orders Placed This Encounter  Procedures  . CT RENAL STONE STUDY    Standing Status:   Future    Standing Expiration Date:   05/07/2019    Order Specific Question:   Preferred imaging location?    Answer:   [facility]    Order Specific Question:   Radiology Contrast Protocol - do NOT remove file path    Answer:   [email or web address]      Return in about 6 months (around 10/04/2019) for History of cystectomy and stones. .   CCClaretta Fraise, MD      Irine Seal 04/06/2019

## 2019-04-26 DIAGNOSIS — Z936 Other artificial openings of urinary tract status: Secondary | ICD-10-CM | POA: Diagnosis not present

## 2019-05-08 ENCOUNTER — Other Ambulatory Visit: Payer: Self-pay

## 2019-05-09 ENCOUNTER — Ambulatory Visit (INDEPENDENT_AMBULATORY_CARE_PROVIDER_SITE_OTHER): Payer: Medicare HMO | Admitting: Family Medicine

## 2019-05-09 ENCOUNTER — Other Ambulatory Visit: Payer: Self-pay

## 2019-05-09 ENCOUNTER — Encounter: Payer: Self-pay | Admitting: Family Medicine

## 2019-05-09 VITALS — BP 130/75 | HR 78 | Temp 97.8°F | Ht 65.0 in | Wt 140.0 lb

## 2019-05-09 DIAGNOSIS — D485 Neoplasm of uncertain behavior of skin: Secondary | ICD-10-CM

## 2019-05-09 DIAGNOSIS — R69 Illness, unspecified: Secondary | ICD-10-CM | POA: Diagnosis not present

## 2019-05-09 DIAGNOSIS — L89321 Pressure ulcer of left buttock, stage 1: Secondary | ICD-10-CM

## 2019-05-09 MED ORDER — DUODERM CGF DRESSING EX MISC: 1.0000 | CUTANEOUS | 3 refills | Status: DC

## 2019-05-09 NOTE — Progress Notes (Signed)
Chief Complaint  Patient presents with  . Bed Sore    worsened over last month    HPI  Patient presents today for skin lesions at the right forearm and of the buttocks.  Patient's daughter is with her.  They have been treating the place on the buttocks.  They are trying to get her to shift positions as much as possible.  Her son tells me that he is gotten a doughnut for her to sit on.  PMH: Smoking status noted ROS: Per HPI  Objective: BP 130/75   Pulse 78   Temp 97.8 F (36.6 C) (Temporal)   Ht 5\' 5"  (1.651 m)   Wt 140 lb (63.5 kg)   BMI 23.30 kg/m  Gen: NAD, alert, cooperative with exam HEENT: NCAT,  Skin: There is a 4 mm warty lesion on the right dorsal wrist.  There is a 4 cm grade 1 decubitus at the left buttock adjacent to the gluteal fold Ext: No edema, warm  Neuro: Alert and oriented, No gross deficits  Assessment and plan:  1. Pressure injury of left buttock, stage 1   2. Neoplasm of uncertain behavior of skin     Meds ordered this encounter  Medications  . Control Gel Formula Dressing (DUODERM CGF DRESSING) MISC    Sig: Apply 1 each topically every 3 (three) days.    Dispense:  5 each    Refill:  3    Orders Placed This Encounter  Procedures  . Ambulatory referral to Dermatology    Referral Priority:   Routine    Referral Type:   Consultation    Referral Reason:   Specialty Services Required    Requested Specialty:   Dermatology    Number of Visits Requested:   1    Follow up as needed.  [name], MD

## 2019-05-15 DIAGNOSIS — D485 Neoplasm of uncertain behavior of skin: Secondary | ICD-10-CM | POA: Diagnosis not present

## 2019-05-15 DIAGNOSIS — B078 Other viral warts: Secondary | ICD-10-CM | POA: Diagnosis not present

## 2019-06-04 ENCOUNTER — Telehealth: Payer: Self-pay | Admitting: Urology

## 2019-06-04 ENCOUNTER — Telehealth: Payer: Self-pay | Admitting: Family Medicine

## 2019-06-04 ENCOUNTER — Other Ambulatory Visit: Payer: Self-pay | Admitting: Family Medicine

## 2019-06-04 NOTE — Telephone Encounter (Signed)
Mr Theresa Hunt is her caretaker.

## 2019-06-04 NOTE — Telephone Encounter (Signed)
This has Rico Junker but Theresa Hunt is the pt. Is this the right pt?

## 2019-06-04 NOTE — Telephone Encounter (Signed)
Ureterosotmy site looks black, noticed on Saturday  No bleeding, no pus no drainage. No pain, no scabbing  Urine is dark. Pt does have dementia. Pt is not confused or disoriented at this time. No changes in medications.  Pt has an appt 06/08/19 with [facility]. Does she need to be seen sooner?

## 2019-06-04 NOTE — Telephone Encounter (Signed)
Mr Rico Junker requests a return call from the nurse for advise.

## 2019-06-04 NOTE — Telephone Encounter (Signed)
I recommend she see the acute provider. WS

## 2019-06-04 NOTE — Telephone Encounter (Signed)
Scheduled 06/06/19 with Monia Pouch

## 2019-06-05 ENCOUNTER — Ambulatory Visit (INDEPENDENT_AMBULATORY_CARE_PROVIDER_SITE_OTHER): Payer: Medicare HMO | Admitting: Urology

## 2019-06-05 ENCOUNTER — Encounter: Payer: Self-pay | Admitting: Urology

## 2019-06-05 ENCOUNTER — Other Ambulatory Visit: Payer: Self-pay

## 2019-06-05 VITALS — BP 164/71 | HR 76 | Temp 97.3°F | Ht 65.0 in | Wt 139.0 lb

## 2019-06-05 DIAGNOSIS — L98499 Non-pressure chronic ulcer of skin of other sites with unspecified severity: Secondary | ICD-10-CM | POA: Diagnosis not present

## 2019-06-05 DIAGNOSIS — B372 Candidiasis of skin and nail: Secondary | ICD-10-CM | POA: Diagnosis not present

## 2019-06-05 MED ORDER — FLUCONAZOLE 100 MG PO TABS: 100.0000 mg | ORAL_TABLET | Freq: Every day | ORAL | 0 refills | Status: DC

## 2019-06-05 NOTE — Progress Notes (Signed)

## 2019-06-05 NOTE — Progress Notes (Signed)
H&P  Chief Complaint: Urostomy Bag Complications  History of Present Illness:  3.23.2021:  Theresa Hunt is a 86 y.o. year old female presenting today for evaluation of possible infection of urostomy bag stoma. This was first noted around 3.20.2021 by her caregiver -- her skin was dark/black with pus but was apparently non-painful. Urine is still draining properly.   Past Medical History:  Diagnosis Date  . Hyperlipidemia   . Hypertension   . Interstitial cystitis   . SVT (supraventricular tachycardia) (HCC)     Past Surgical History:  Procedure Laterality Date  . ABDOMINAL HYSTERECTOMY    . CHOLECYSTECTOMY    . CYSTECTOMY W/ URETEROILEAL CONDUIT    . REVISION UROSTOMY CUTANEOUS      Home Medications:  (Not in a hospital admission)   Allergies:  Allergies  Allergen Reactions  . Codeine Other (See Comments)    Pt. does not remember    Family History  Problem Relation Age of Onset  . Emphysema Father   . CAD Mother 39  . Cancer Daughter        Breast    Social History:  reports that she has never smoked. She has never used smokeless tobacco. She reports that she does not drink alcohol or use drugs.  ROS: A complete review of systems was performed.  All systems are negative except for pertinent findings as noted.  Physical Exam:  Vital signs in last 24 hours: Temp:  [97.3 F (36.3 C)] 97.3 F (36.3 C) (03/23 1059) Pulse Rate:  [76] 76 (03/23 1059) BP: (164)/(71) 164/71 (03/23 1059) Weight:  [139 lb (63 kg)] 139 lb (63 kg) (03/23 1059) General:  Alert and oriented, No acute distress HEENT: Normocephalic, atraumatic Neck: No JVD or lymphadenopathy Cardiovascular: Regular rate  Lungs: Normal inspiratory/expiratory excursion Abdomen: Soft, nontender, nondistended, no abdominal masses, Urostomy bag present. Crescentic ulcer encompassing upper half of ostomy site. Ostomy bag opening too wide. Stoma stenotic. Back: No CVA tenderness Extremities: No  edema Neurologic: Grossly intact   Radiologic Imaging: No results found.  Impression/Assessment:  Her stoma does seem infected w/ ulcer. It seems like her ostomy plate openings are being cut too widely and this could be exposing her skin to more urine than necessary. Stoma today is tender on exam.   Plan:  1. Urostomy bag changed.  2. Her daughter was advised to look into having a wound/ostomy care nurse from home health care help manage this. Otherwise, her current care provider should try to cut ostomy plate openings narrower to try to protect skin from exposure to urine.  3. Start on diflucan. Given written instructions to use protective powder when changing bags.   Dena Billet 06/05/2019, 11:29 AM  Lillette Boxer. Dahlstedt MD

## 2019-06-05 NOTE — Telephone Encounter (Signed)
Pt coming in for appointment today at 1045.

## 2019-06-06 ENCOUNTER — Ambulatory Visit: Payer: Medicare HMO | Admitting: Family Medicine

## 2019-06-06 DIAGNOSIS — Z936 Other artificial openings of urinary tract status: Secondary | ICD-10-CM | POA: Diagnosis not present

## 2019-06-08 ENCOUNTER — Ambulatory Visit (INDEPENDENT_AMBULATORY_CARE_PROVIDER_SITE_OTHER): Payer: Medicare HMO | Admitting: Family Medicine

## 2019-06-08 ENCOUNTER — Other Ambulatory Visit: Payer: Self-pay

## 2019-06-08 VITALS — BP 150/83 | HR 81 | Temp 97.3°F | Ht 65.0 in | Wt 141.6 lb

## 2019-06-08 DIAGNOSIS — I878 Other specified disorders of veins: Secondary | ICD-10-CM

## 2019-06-08 DIAGNOSIS — G301 Alzheimer's disease with late onset: Secondary | ICD-10-CM

## 2019-06-08 DIAGNOSIS — F0280 Dementia in other diseases classified elsewhere without behavioral disturbance: Secondary | ICD-10-CM | POA: Diagnosis not present

## 2019-06-08 DIAGNOSIS — R69 Illness, unspecified: Secondary | ICD-10-CM | POA: Diagnosis not present

## 2019-06-08 NOTE — Progress Notes (Signed)
Subjective:  Patient ID: Theresa Hunt, female    DOB: 12-15-1929  Age: 86 y.o. MRN: [id number]  CC: swelling in legs with redness (right leg worse)   HPI Theresa Hunt presents for swelling of legs - right is more swllen but also red. Sore.   Having some loss of memory  Depression screen [facility] 2/9 06/08/2019 05/09/2019 12/05/2018  Decreased Interest 0 0 0  Down, Depressed, Hopeless 0 0 0  PHQ - 2 Score 0 0 0  Altered sleeping - - -  Tired, decreased energy - - -  Change in appetite - - -  Feeling bad or failure about yourself  - - -  Trouble concentrating - - -  Moving slowly or fidgety/restless - - -  Suicidal thoughts - - -  PHQ-9 Score - - -  Difficult doing work/chores - - -    History Jarica has a past medical history of Hyperlipidemia, Hypertension, Interstitial cystitis, and SVT (supraventricular tachycardia) ([place]).   She has a past surgical history that includes Abdominal hysterectomy; Cholecystectomy; Revision urostomy cutaneous; and Cystectomy w/ ureteroileal conduit.   Her family history includes CAD (age of onset: 39) in her mother; Cancer in her daughter; Emphysema in her father.She reports that she has never smoked. She has never used smokeless tobacco. She reports that she does not drink alcohol or use drugs.    ROS Review of Systems  Constitutional: Negative.   HENT: Negative.   Eyes: Negative for visual disturbance.  Respiratory: Negative for shortness of breath.   Cardiovascular: Positive for leg swelling. Negative for chest pain.  Gastrointestinal: Negative for abdominal pain.  Musculoskeletal: Negative for arthralgias.    Objective:  BP (!) 150/83   Pulse 81   Temp (!) 97.3 F (36.3 C) (Temporal)   Ht 5\' 5"  (1.651 m)   Wt 141 lb 9.6 oz (64.2 kg)   SpO2 97%   BMI 23.56 kg/m   BP Readings from Last 3 Encounters:  06/08/19 (!) 150/83  06/05/19 (!) 164/71  05/09/19 130/75    Wt Readings from Last 3 Encounters:  06/08/19 141 lb 9.6 oz (64.2 kg)    06/05/19 139 lb (63 kg)  05/09/19 140 lb (63.5 kg)     Physical Exam Constitutional:      General: She is not in acute distress.    Appearance: She is well-developed.  Cardiovascular:     Rate and Rhythm: Normal rate and regular rhythm.  Pulmonary:     Breath sounds: Normal breath sounds.  Musculoskeletal:        General: Swelling (2-3+ at RLE) present.  Skin:    General: Skin is warm and dry.  Neurological:     Mental Status: She is alert and oriented to person, place, and time.         Assessment & Plan:   [name] was seen today for swelling in legs with redness.  Diagnoses and all orders for this visit:  Venous stasis of lower extremity -     Compression stockings  Late onset Alzheimer's disease without behavioral disturbance (HCC) -     Compression stockings       I am having Franne Grip. Lemke maintain her MAGNESIUM PO, aspirin, fish oil-omega-3 fatty acids, Vitamin D3, metFORMIN, DuoDERM CGF Dressing, metoprolol succinate, and fluconazole.  Allergies as of 06/08/2019      Reactions   Codeine Other (See Comments)   Pt. does not remember      Medication List  Accurate as of June 08, 2019 11:59 PM. If you have any questions, ask your nurse or doctor.        aspirin 81 MG tablet Take 81 mg by mouth daily.   DuoDERM CGF Dressing Misc Apply 1 each topically every 3 (three) days.   fish oil-omega-3 fatty acids 1000 MG capsule Take 1 g by mouth daily.   fluconazole 100 MG tablet Commonly known as: DIFLUCAN Take 1 tablet (100 mg total) by mouth daily. X 7 days   MAGNESIUM PO Take 250 mg by mouth daily.   metFORMIN 500 MG tablet Commonly known as: GLUCOPHAGE TAKE ONE (1) TABLET EACH DAY   metoprolol succinate 50 MG 24 hr tablet Commonly known as: TOPROL-XL TAKE ONE (1) TABLET EACH DAY   Vitamin D3 75 MCG (3000 UT) Tabs Take 2,000 Units by mouth.        Follow-up: Return in about 2 weeks (around 06/22/2019).  [name], M.D.

## 2019-06-10 ENCOUNTER — Encounter: Payer: Self-pay | Admitting: Family Medicine

## 2019-06-11 ENCOUNTER — Telehealth: Payer: Self-pay | Admitting: Urology

## 2019-06-11 NOTE — Telephone Encounter (Signed)
Pts daughter called and asks for a nurse to return her call regarding wound care home health nurse. She states they have not heard from anyone regarding this.

## 2019-06-11 NOTE — Telephone Encounter (Signed)
Home health referral was sent last week. Will send back referral to [facility]. Spoke with daughter

## 2019-06-12 ENCOUNTER — Emergency Department (HOSPITAL_COMMUNITY)
Admission: EM | Admit: 2019-06-12 | Discharge: 2019-06-12 | Disposition: A | Discharge status: Home / Self Care | Payer: Medicare HMO | Attending: Emergency Medicine | Admitting: Emergency Medicine

## 2019-06-12 ENCOUNTER — Other Ambulatory Visit: Payer: Self-pay

## 2019-06-12 ENCOUNTER — Encounter (HOSPITAL_COMMUNITY): Payer: Self-pay | Admitting: *Deleted

## 2019-06-12 DIAGNOSIS — N99528 Other complication of other external stoma of urinary tract: Secondary | ICD-10-CM | POA: Diagnosis present

## 2019-06-12 DIAGNOSIS — I10 Essential (primary) hypertension: Secondary | ICD-10-CM | POA: Diagnosis not present

## 2019-06-12 DIAGNOSIS — F0390 Unspecified dementia without behavioral disturbance: Secondary | ICD-10-CM | POA: Insufficient documentation

## 2019-06-12 DIAGNOSIS — Z7982 Long term (current) use of aspirin: Secondary | ICD-10-CM | POA: Insufficient documentation

## 2019-06-12 DIAGNOSIS — R69 Illness, unspecified: Secondary | ICD-10-CM | POA: Diagnosis not present

## 2019-06-12 DIAGNOSIS — Z79899 Other long term (current) drug therapy: Secondary | ICD-10-CM | POA: Insufficient documentation

## 2019-06-12 DIAGNOSIS — R310 Gross hematuria: Secondary | ICD-10-CM | POA: Diagnosis not present

## 2019-06-12 DIAGNOSIS — N99820 Postprocedural hemorrhage and hematoma of a genitourinary system organ or structure following a genitourinary system procedure: Secondary | ICD-10-CM | POA: Diagnosis not present

## 2019-06-12 HISTORY — DX: Unspecified dementia, unspecified severity, without behavioral disturbance, psychotic disturbance, mood disturbance, and anxiety: F03.90

## 2019-06-12 LAB — CBC WITH DIFFERENTIAL/PLATELET
Abs Immature Granulocytes: 0.03 10*3/uL (ref 0.00–0.07)
Basophils Absolute: 0 10*3/uL (ref 0.0–0.1)
Basophils Relative: 0 %
Eosinophils Absolute: 0.1 10*3/uL (ref 0.0–0.5)
Eosinophils Relative: 2 %
HCT: 36.1 % (ref 36.0–46.0)
Hemoglobin: 11.7 g/dL — ABNORMAL LOW (ref 12.0–15.0)
Immature Granulocytes: 0 %
Lymphocytes Relative: 23 %
Lymphs Abs: 1.8 10*3/uL (ref 0.7–4.0)
MCH: 31 pg (ref 26.0–34.0)
MCHC: 32.4 g/dL (ref 30.0–36.0)
MCV: 95.5 fL (ref 80.0–100.0)
Monocytes Absolute: 0.6 10*3/uL (ref 0.1–1.0)
Monocytes Relative: 8 %
Neutro Abs: 5 10*3/uL (ref 1.7–7.7)
Neutrophils Relative %: 67 %
Platelets: 196 10*3/uL (ref 150–400)
RBC: 3.78 MIL/uL — ABNORMAL LOW (ref 3.87–5.11)
RDW: 13 % (ref 11.5–15.5)
WBC: 7.5 10*3/uL (ref 4.0–10.5)
nRBC: 0 % (ref 0.0–0.2)

## 2019-06-12 LAB — BASIC METABOLIC PANEL
Anion gap: 8 (ref 5–15)
BUN: 34 mg/dL — ABNORMAL HIGH (ref 8–23)
CO2: 25 mmol/L (ref 22–32)
Calcium: 9.9 mg/dL (ref 8.9–10.3)
Chloride: 105 mmol/L (ref 98–111)
Creatinine, Ser: 0.93 mg/dL (ref 0.44–1.00)
GFR calc Af Amer: >60 mL/min (ref >60)
GFR calc non Af Amer: 55 mL/min — ABNORMAL LOW (ref >60)
Glucose, Bld: 125 mg/dL — ABNORMAL HIGH (ref 70–99)
Potassium: 4.4 mmol/L (ref 3.5–5.1)
Sodium: 138 mmol/L (ref 135–145)

## 2019-06-12 MED ORDER — SILVER NITRATE-POT NITRATE 75-25 % EX MISC
1.0000 | Freq: Once | CUTANEOUS | Status: AC
  Administered 2019-06-12: 1 via TOPICAL
  Filled 2019-06-12: qty 10

## 2019-06-12 NOTE — ED Provider Notes (Signed)
Medical screening examination/treatment/procedure(s) were conducted as a shared visit with non-physician practitioner(s) and myself.  I personally evaluated the patient during the encounter.    89yF with bleeding from around her urostomy. The area immediately surrounding the urostomy is raw/denuded. ~1cm superior to the ostomy there is a small bleeder. Low volume but pulsatile. 1cc of lidocaine with epinephrine was carefully injected superficially. This slowed the bleeding but didn't completely stop it. Small figure-of-eight suture with 6-0 vicryl placed very superficially and bleeding stopped. Observed for ~15 minutes w/o additional bleeding. New bag placed. Follow-up with urology.    Virgel Manifold, MD 06/12/19 2039

## 2019-06-12 NOTE — ED Notes (Signed)
Ostomy bad changed. Site secured with no bleeding .

## 2019-06-12 NOTE — ED Triage Notes (Signed)
Pt noted blood in her urostomy bag today, pt with frequent infections and just finished antibiotic yesterday.

## 2019-06-12 NOTE — Telephone Encounter (Signed)
[facility] denied request- "not enough staff" will send out to another home health agency

## 2019-06-12 NOTE — Discharge Instructions (Addendum)
Follow-up with Dr. Jeffie Pollock for recheck.

## 2019-06-12 NOTE — ED Notes (Signed)
On suture placed by physician around ostomy site from a bleeding blood vessel.

## 2019-06-13 NOTE — Telephone Encounter (Signed)
New request sent to [facility].

## 2019-06-18 ENCOUNTER — Telehealth: Payer: Self-pay | Admitting: Urology

## 2019-06-18 NOTE — Telephone Encounter (Signed)
Ive reached out to several home health agency. [facility] does not have staff to provide support and [place] area does not travel to [place] area. Daughter states the area looks better - what you would you like to do? 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

## 2019-06-18 NOTE — Telephone Encounter (Signed)
[facility] called and said they do not have coverage in this pts area.

## 2019-06-19 NOTE — Telephone Encounter (Signed)
If the area looks better, I do not think she needs any real treatment.  If home health care will come, she will probably need to go to a wound care center, perhaps in [place].

## 2019-06-20 NOTE — ED Provider Notes (Signed)
[facility] Provider Note   CSN: [id number] Arrival date & time: 06/12/19  1730     History Chief Complaint  Patient presents with  . Hematuria    Theresa Hunt is a 86 y.o. female.  HPI      Theresa Hunt is a 86 y.o. female who presents to the Emergency Department complaining of bleeding from her urostomy site.  States she woke up and noticed a large amount of blood in her urostomy bag.  She was recently treated with ?antibiotic for UTI, took last dose one day prior.  She denies abdominal pain, fever, chills, weakness, dizziness, and nausea or vomiting.  She takes an 81 mg ASA daily.     Past Medical History:  Diagnosis Date  . Dementia ([place])   . Hyperlipidemia   . Hypertension   . Interstitial cystitis   . SVT (supraventricular tachycardia) [facility])     Patient Active Problem List   Diagnosis Date Noted  . 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 Nephrolithiasis 04/06/2019  . History of removal of part of urinary tract 04/06/2019  . History of urinary tract infection 04/06/2019  . UTI (urinary tract infection) 10/09/2018  . Interstitial cystitis   . Dementia ([place]) 04/11/2015  . Essential hypertension with goal blood pressure less than 130/80 04/23/2014  . Memory loss 04/23/2014  . Hyperlipidemia 10/22/2013  . SVT (supraventricular tachycardia) ([place]) 09/06/2012    Past Surgical History:  Procedure Laterality Date  . ABDOMINAL HYSTERECTOMY    . CHOLECYSTECTOMY    . CYSTECTOMY W/ URETEROILEAL CONDUIT    . REVISION UROSTOMY CUTANEOUS       OB History   No obstetric history on file.     Family History  Problem Relation Age of Onset  . Emphysema Father   . CAD Mother 90  . Cancer Daughter        Breast    Social History   Tobacco Use  . Smoking status: Never Smoker  . Smokeless tobacco: Never Used  Substance Use Topics  . Alcohol use: No  . Drug use: No    Home Medications Prior to Admission  medications   Medication Sig Start Date End Date Taking? Authorizing Provider  aspirin 81 MG tablet Take 81 mg by mouth daily.   Yes [provider]  Cholecalciferol (VITAMIN D3) 50 MCG (2000 UT) capsule Take 2,000 Units by mouth daily.    Yes [provider]  Control Gel Formula Dressing (DUODERM CGF DRESSING) MISC Apply 1 each topically every 3 (three) days. 05/09/19  Yes [name], MD  fish oil-omega-3 fatty acids 1000 MG capsule Take 1 g by mouth daily.   Yes [provider]  metFORMIN (GLUCOPHAGE) 500 MG tablet TAKE ONE (1) TABLET EACH DAY Patient taking differently: Take 500 mg by mouth daily.  04/06/19  Yes Stacks, Cletus Gash, MD  metoprolol succinate (TOPROL-XL) 50 MG 24 hr tablet TAKE ONE (1) TABLET EACH DAY Patient taking differently: Take 50 mg by mouth daily.  06/04/19  Yes [name], MD  fluconazole (DIFLUCAN) 100 MG tablet Take 1 tablet (100 mg total) by mouth daily. X 7 days Patient not taking: Reported on 06/12/2019 06/05/19   Franchot Gallo, MD    Allergies    Codeine  Review of Systems   Review of Systems  Constitutional: Negative for activity change, appetite change, chills and fever.  Respiratory: Negative for chest tightness and shortness of breath.   Gastrointestinal: Negative for abdominal  pain, nausea and vomiting.  Genitourinary: Positive for hematuria and urgency. Negative for decreased urine volume, difficulty urinating, dysuria, flank pain and frequency.  Musculoskeletal: Negative for back pain.  Skin: Negative for rash.  Neurological: Negative for dizziness, syncope, weakness, light-headedness, numbness and headaches.  Hematological: Negative for adenopathy.  Psychiatric/Behavioral: Negative for confusion.    Physical Exam Updated Vital Signs BP (!) 133/53 (BP Location: Right Arm)   Pulse 72   Temp 97.8 F (36.6 C) (Oral)   Resp 18   Ht 5\' 2"  (1.575 m)   Wt 63.5 kg   SpO2 98%   BMI 25.61 kg/m   Physical  Exam Vitals and nursing note reviewed.  Constitutional:      Appearance: Normal appearance. She is not toxic-appearing.  HENT:     Head: Atraumatic.  Cardiovascular:     Rate and Rhythm: Normal rate and regular rhythm.     Pulses: Normal pulses.  Pulmonary:     Effort: Pulmonary effort is normal.     Breath sounds: Normal breath sounds.  Abdominal:     General: There is no distension.     Palpations: Abdomen is soft. There is no mass.     Tenderness: There is no abdominal tenderness.     Comments: Urostomy bag removed, small area of denuded skin just superior to stoma, actively bleeding. No edema.   Musculoskeletal:        General: Normal range of motion.     Right lower leg: No edema.     Left lower leg: No edema.  Skin:    Findings: No rash.  Neurological:     General: No focal deficit present.     Mental Status: She is alert.     Sensory: No sensory deficit.     Motor: No weakness.     ED Results / Procedures / Treatments   Labs (all labs ordered are listed, but only abnormal results are displayed) Labs Reviewed  BASIC METABOLIC PANEL - Abnormal; Notable for the following components:      Result Value   Glucose, Bld 125 (*)    BUN 34 (*)    GFR calc non Af Amer 55 (*)    All other components within normal limits  CBC WITH DIFFERENTIAL/PLATELET - Abnormal; Notable for the following components:   RBC 3.78 (*)    Hemoglobin 11.7 (*)    All other components within normal limits    EKG None  Radiology No results found.  Procedures Procedures (including critical care time)  Medications Ordered in ED Medications  silver nitrate applicators applicator 1 Stick (1 Stick Topical Given by Other 06/12/19 2025)    ED Course  I have reviewed the triage vital signs and the nursing notes.  Pertinent labs & imaging results that were available during my care of the patient were reviewed by me and considered in my medical decision making (see chart for details).     MDM Rules/Calculators/A&P                      Pt appears well and non toxic.  Hx of urostomy, woke with bleeding near the stoma.  A small area of denuded skin just above the stoma site with an exposed bleeding vessel.  hemostasis obtained by procedure performed by Dr. Wilson Singer.  See his note for procedure.    Pt observed in the dept without complication.  Urostomy bag replaced by nursing.  Pt agrees to f/u with urology.  Final Clinical Impression(s) / ED Diagnoses Final diagnoses:  Urinary stoma complication [facility])    Rx / [place] Orders ED Discharge Orders    None       Bufford Lope 06/20/19 2303    Virgel Manifold, MD 06/27/19 [phone]

## 2019-06-20 NOTE — Telephone Encounter (Signed)
Daughter notified and states the area has healed. They wish to keep appt with Dr. Jeffie Pollock on 4/30. Declined the wound outpatient.

## 2019-06-21 ENCOUNTER — Ambulatory Visit: Payer: Medicare HMO | Admitting: Family Medicine

## 2019-06-25 ENCOUNTER — Telehealth: Payer: Self-pay | Admitting: Family Medicine

## 2019-06-25 NOTE — Telephone Encounter (Signed)
She has been taking.

## 2019-07-03 ENCOUNTER — Other Ambulatory Visit: Payer: Self-pay | Admitting: Family Medicine

## 2019-07-13 ENCOUNTER — Other Ambulatory Visit: Payer: Self-pay

## 2019-07-13 ENCOUNTER — Ambulatory Visit (INDEPENDENT_AMBULATORY_CARE_PROVIDER_SITE_OTHER): Payer: Medicare HMO | Admitting: Urology

## 2019-07-13 VITALS — BP 171/72 | HR 76 | Temp 97.3°F | Ht 65.0 in | Wt 140.0 lb

## 2019-07-13 DIAGNOSIS — N200 Calculus of kidney: Secondary | ICD-10-CM | POA: Diagnosis not present

## 2019-07-13 DIAGNOSIS — T85858D Stenosis due to other internal prosthetic devices, implants and grafts, subsequent encounter: Secondary | ICD-10-CM

## 2019-07-13 NOTE — Progress Notes (Signed)
Subjective:  Dx: ileal conduit stomal stenosis: subsequent encounter.   07/13/19:  [name] returns today in f/u for stomal concerns.   The night aide has told the family that the stoma is doing better.   She did have some stomal bleeding after her last visit and had to go to the ER for a stitch.  She has had no further bleeding.    Attempts were made to get home health visits but we weren't able to find an agency that could help.  The daughter has reported improvement in the area of concern.    04/06/19 : Pt has a hx of cystectomy for IC. She has had issues with stomal stenosis and has had an has had some recurrent UTI's in the past and has had a prior stomal revision.  She was noted to have possible recurrent stomal stenosis by the home aide but there is no issue with the drainage.  There is an odor but the urine is generally clear.  She has progressive dementia. Her CT scan in July 2019 showed a 6.77mm right renal stone and a tiny left renal stone. She has stable renal cysts and no findings of hydro or loop dilation. there was no peristomal hernia. She has no other associated signs or symptoms.         ROS:  ROS:  A complete review of systems was performed.  All systems are negative except for pertinent findings as noted.   Review of Systems  All other systems reviewed and are negative.   Allergies  Allergen Reactions  . Codeine Other (See Comments)    Pt. does not remember    Outpatient Encounter Medications as of 07/13/2019  Medication Sig Note  . aspirin 81 MG tablet Take 81 mg by mouth daily.   . Cholecalciferol (VITAMIN D3) 50 MCG (2000 UT) capsule Take 2,000 Units by mouth daily.    . Control Gel Formula Dressing (DUODERM CGF DRESSING) MISC Apply 1 each topically every 3 (three) days.   . fish oil-omega-3 fatty acids 1000 MG capsule Take 1 g by mouth daily.   . fluconazole (DIFLUCAN) 100 MG tablet Take 1 tablet (100 mg total) by mouth daily. X 7 days (Patient not taking: Reported  on 06/12/2019) 06/12/2019: Completed 7 day course on 06/11/2019  . metFORMIN (GLUCOPHAGE) 500 MG tablet TAKE ONE (1) TABLET EACH DAY   . metoprolol succinate (TOPROL-XL) 50 MG 24 hr tablet TAKE ONE (1) TABLET EACH DAY    No facility-administered encounter medications on file as of 07/13/2019.    Past Medical History:  Diagnosis Date  . Dementia ([place])   . Hyperlipidemia   . Hypertension   . Interstitial cystitis   . SVT (supraventricular tachycardia) (HCC)     Past Surgical History:  Procedure Laterality Date  . ABDOMINAL HYSTERECTOMY    . CHOLECYSTECTOMY    . CYSTECTOMY W/ URETEROILEAL CONDUIT    . REVISION UROSTOMY CUTANEOUS      Social History   Socioeconomic History  . Marital status: Widowed    Spouse name: Not on file  . Number of children: 3  . Years of education: Not on file  . Highest education level: Not on file  Occupational History  . Not on file  Tobacco Use  . Smoking status: Never Smoker  . Smokeless tobacco: Never Used  Substance and Sexual Activity  . Alcohol use: No  . Drug use: No  . Sexual activity: Not on file  Other Topics Concern  .  Not on file  Social History Narrative   Lives alone.    Social Determinants of Health   Financial Resource Strain:   . Difficulty of Paying Living Expenses:   Food Insecurity:   . Worried About Charity fundraiser in the Last Year:   . Arboriculturist in the Last Year:   Transportation Needs:   . Film/video editor (Medical):   Marland Kitchen Lack of Transportation (Non-Medical):   Physical Activity:   . Days of Exercise per Week:   . Minutes of Exercise per Session:   Stress:   . Feeling of Stress :   Social Connections:   . Frequency of Communication with Friends and Family:   . Frequency of Social Gatherings with Friends and Family:   . Attends Religious Services:   . Active Member of Clubs or Organizations:   . Attends Archivist Meetings:   Marland Kitchen Marital Status:   Intimate Partner Violence:   . Fear  of Current or Ex-Partner:   . Emotionally Abused:   Marland Kitchen Physically Abused:   . Sexually Abused:     Family History  Problem Relation Age of Onset  . Emphysema Father   . CAD Mother 71  . Cancer Daughter        Breast       Objective: BP (!) 171/72   Pulse 76   Temp (!) 97.3 F (36.3 C)   Ht 5\' 5"  (1.651 m)   Wt 140 lb (63.5 kg)   BMI 23.30 kg/m     Physical Exam Abdominal:     Comments: There is a RLQ stoma with the bag in place.  The stoma appears patent but stenotic and there are no obvious varicosities present.      Lab Results:  No results found for this or any previous visit (from the past 24 hour(s)).  BMET  Results for EVALYNA, VADNAIS (MRN ID:[id number]) as of 04/06/2019 10:58  Ref. Range 10/19/2018 14:37  Sodium Latest Ref Range: 134 - 144 mmol/L 138  Potassium Latest Ref Range: 3.5 - 5.2 mmol/L 4.4  Chloride Latest Ref Range: 96 - 106 mmol/L 98  CO2 Latest Ref Range: 20 - 29 mmol/L 27  Glucose Latest Ref Range: 65 - 99 mg/dL 140 (H)  BUN Latest Ref Range: 8 - 27 mg/dL 16  Creatinine Latest Ref Range: 0.57 - 1.00 mg/dL 0.73  Calcium Latest Ref Range: 8.7 - 10.3 mg/dL 10.2     Studies/Results: KUB in 7/20 showed no obvious stones.      Assessment & Plan: Interstitial cystitis with prior cystectomy.  She is doing well and has only mild stoma stenosis.   She had some bleeding but that has resolved.    History of urinary tract infection.  She is doing well today without symptoms.   Nephrolithiasis and renal cysts.   Last imaging in 2019.       No orders of the defined types were placed in this encounter.    No orders of the defined types were placed in this encounter.     No follow-ups on file.   CC: Theresa Fraise, MD      Theresa Hunt 07/13/2019

## 2019-07-26 ENCOUNTER — Telehealth: Payer: Self-pay | Admitting: Family Medicine

## 2019-07-26 NOTE — Telephone Encounter (Signed)
They will get in tough with Dr. Ralene Muskrat office since they were just there, if this is not able to be done by their office patient is aware they will need a F2F visit in office or by video for Dr. Livia Snellen to place the order

## 2019-07-26 NOTE — Telephone Encounter (Signed)
Pts daughter called wanting to know the process of getting a nurse or someone to come to pts home to help her take care of her urostomy bag. Says Dr Jeffie Pollock was supposed to help with this but they never heard anything from anyone.

## 2019-08-06 DIAGNOSIS — R69 Illness, unspecified: Secondary | ICD-10-CM | POA: Diagnosis not present

## 2019-09-29 DIAGNOSIS — R69 Illness, unspecified: Secondary | ICD-10-CM | POA: Diagnosis not present

## 2019-10-01 ENCOUNTER — Other Ambulatory Visit: Payer: Self-pay | Admitting: Family Medicine

## 2019-10-05 ENCOUNTER — Ambulatory Visit: Payer: Medicare HMO | Admitting: Urology

## 2019-10-24 DIAGNOSIS — Z936 Other artificial openings of urinary tract status: Secondary | ICD-10-CM | POA: Diagnosis not present

## 2019-11-01 ENCOUNTER — Ambulatory Visit (INDEPENDENT_AMBULATORY_CARE_PROVIDER_SITE_OTHER): Payer: Medicare HMO | Admitting: Nurse Practitioner

## 2019-11-01 ENCOUNTER — Encounter: Payer: Self-pay | Admitting: Nurse Practitioner

## 2019-11-01 ENCOUNTER — Other Ambulatory Visit: Payer: Self-pay

## 2019-11-01 VITALS — BP 149/70 | HR 69 | Temp 97.5°F | Ht 65.0 in | Wt 145.2 lb

## 2019-11-01 DIAGNOSIS — L89321 Pressure ulcer of left buttock, stage 1: Secondary | ICD-10-CM

## 2019-11-01 DIAGNOSIS — R69 Illness, unspecified: Secondary | ICD-10-CM | POA: Diagnosis not present

## 2019-11-01 MED ORDER — DUODERM CGF DRESSING EX MISC: 1.0000 | CUTANEOUS | 3 refills | Status: AC

## 2019-11-01 MED ORDER — FOAM CUSHION THERAPEUTIC RING MISC: 1.0000 | 0 refills | Status: DC | PRN

## 2019-11-01 NOTE — Assessment & Plan Note (Signed)
Patient is a 86 year old female who presents for follow-up stage I pressure ulcer bilateral buttocks.  This has been ongoing for 3 months.  Patient has treated pressure sores with DuoDERM 4 x 4 changing dressing every 3 days.  Patient is reporting healing wound.  Symptoms gradually  improving.  Patient is reporting only mild pain today she denies fever chills or any signs and symptoms of skin infection.  On assessment skin is gradually healing.  Skin is scabbed over.  Skin around area is slightly erythematous.  Provided education to patient and caregiver on cleaning and keeping skin dry.  Dressing wound as directed.  Advised patient to take pressure off buttocks while sitting.  Lying on side and turning every 2-4 hours. Ordered therapeutic cushion, changed duodenum size from a 4 x 4 to a 2 x 2 for easy application. Rx sent to pharmacy. Patient follow-up as needed for worsening or unresolved symptoms.

## 2019-11-01 NOTE — Progress Notes (Signed)
Subjective:  Dx: ileal conduit stomal stenosis: subsequent encounter.   11/02/19: Theresa Hunt returns today in f/u.   She is doing well with the stoma.  She has had no hematuria or UTI's.  She has had no flank pain.   She was seen by her PCP yesterday for f/u of early sacral decubiti.    07/13/19:  Theresa Hunt returns today in f/u for stomal concerns.   The night aide has told the family that the stoma is doing better.   She did have some stomal bleeding after her last visit and had to go to the ER for a stitch.  She has had no further bleeding.    Attempts were made to get home health visits but we weren't able to find an agency that could help.  The daughter has reported improvement in the area of concern.    04/06/19 : Pt has a hx of cystectomy for IC. She has had issues with stomal stenosis and has had an has had some recurrent UTI's in the past and has had a prior stomal revision.  She was noted to have possible recurrent stomal stenosis by the home aide but there is no issue with the drainage.  There is an odor but the urine is generally clear.  She has progressive dementia. Her CT scan in July 2019 showed a 6.76mm right renal stone and a tiny left renal stone. She has stable renal cysts and no findings of hydro or loop dilation. there was no peristomal hernia. She has no other associated signs or symptoms.         ROS:  ROS:  A complete review of systems was performed.  All systems are negative except for pertinent findings as noted.   Review of Systems  All other systems reviewed and are negative.   Allergies  Allergen Reactions  . Codeine Other (See Comments)    Pt. does not remember    Outpatient Encounter Medications as of 11/02/2019  Medication Sig  . aspirin 81 MG tablet Take 81 mg by mouth daily.  . Cholecalciferol (VITAMIN D3) 50 MCG (2000 UT) capsule Take 2,000 Units by mouth daily.   . Control Gel Formula Dressing (DUODERM CGF DRESSING) MISC Apply 1 each topically every 3 (three)  days. Size 2x2 will work better for area of skin  . fish oil-omega-3 fatty acids 1000 MG capsule Take 1 g by mouth daily.  . metFORMIN (GLUCOPHAGE) 500 MG tablet TAKE ONE (1) TABLET EACH DAY  . metoprolol succinate (TOPROL-XL) 50 MG 24 hr tablet TAKE ONE (1) TABLET EACH DAY  . Misc. Devices (FOAM CUSHION THERAPEUTIC RING) MISC 1 each by Does not apply route as needed.  . [DISCONTINUED] Control Gel Formula Dressing (DUODERM CGF DRESSING) MISC Apply 1 each topically every 3 (three) days.   No facility-administered encounter medications on file as of 11/02/2019.    Past Medical History:  Diagnosis Date  . Dementia ([place])   . Hyperlipidemia   . Hypertension   . Interstitial cystitis   . SVT (supraventricular tachycardia) (HCC)     Past Surgical History:  Procedure Laterality Date  . ABDOMINAL HYSTERECTOMY    . CHOLECYSTECTOMY    . CYSTECTOMY W/ URETEROILEAL CONDUIT    . REVISION UROSTOMY CUTANEOUS      Social History   Socioeconomic History  . Marital status: Widowed    Spouse name: Not on file  . Number of children: 3  . Years of education: Not on file  . Highest education  level: Not on file  Occupational History  . Not on file  Tobacco Use  . Smoking status: Never Smoker  . Smokeless tobacco: Never Used  Vaping Use  . Vaping Use: Never used  Substance and Sexual Activity  . Alcohol use: No  . Drug use: No  . Sexual activity: Not on file  Other Topics Concern  . Not on file  Social History Narrative   Lives alone.    Social Determinants of Health   Financial Resource Strain:   . Difficulty of Paying Living Expenses: Not on file  Food Insecurity:   . Worried About Charity fundraiser in the Last Year: Not on file  . Ran Out of Food in the Last Year: Not on file  Transportation Needs:   . Lack of Transportation (Medical): Not on file  . Lack of Transportation (Non-Medical): Not on file  Physical Activity:   . Days of Exercise per Week: Not on file  . Minutes of  Exercise per Session: Not on file  Stress:   . Feeling of Stress : Not on file  Social Connections:   . Frequency of Communication with Friends and Family: Not on file  . Frequency of Social Gatherings with Friends and Family: Not on file  . Attends Religious Services: Not on file  . Active Member of Clubs or Organizations: Not on file  . Attends Archivist Meetings: Not on file  . Marital Status: Not on file  Intimate Partner Violence:   . Fear of Current or Ex-Partner: Not on file  . Emotionally Abused: Not on file  . Physically Abused: Not on file  . Sexually Abused: Not on file    Family History  Problem Relation Age of Onset  . Emphysema Father   . CAD Mother 81  . Cancer Daughter        Breast       Objective: BP (!) 147/61   Pulse 69   Temp 98 F (36.7 C)   Ht 5\' 5"  (1.651 m)   Wt 145 lb 3.2 oz (65.9 kg)   BMI 24.16 kg/m     Physical Exam Abdominal:     Comments: There is a RLQ stoma with the bag in place.  The stoma appears patent but stenotic and there are no obvious varicosities present.      Lab Results:  No results found for this or any previous visit (from the past 24 hour(s)).  BMET  Results for MICKENZIE, STOLAR (MRN [id number]) as of 04/06/2019 10:58  Ref. Range 10/19/2018 14:37  Sodium Latest Ref Range: 134 - 144 mmol/L 138  Potassium Latest Ref Range: 3.5 - 5.2 mmol/L 4.4  Chloride Latest Ref Range: 96 - 106 mmol/L 98  CO2 Latest Ref Range: 20 - 29 mmol/L 27  Glucose Latest Ref Range: 65 - 99 mg/dL 140 (H)  BUN Latest Ref Range: 8 - 27 mg/dL 16  Creatinine Latest Ref Range: 0.57 - 1.00 mg/dL 0.73  Calcium Latest Ref Range: 8.7 - 10.3 mg/dL 10.2     Studies/Results: KUB in 7/20 showed no obvious stones.      Assessment & Plan: Interstitial cystitis with prior cystectomy.  She is doing well and has only mild stoma stenosis.   She has had no further therapy.   History of urinary tract infection.  She is doing well today without  symptoms.   Nephrolithiasis and renal cysts.   Last imaging in 2019.   I will order  an [place] for prior to her 1 year f/u.     No orders of the defined types were placed in this encounter.    Orders Placed This Encounter  Procedures  . US RENAL    Standing Status:   Future    Standing Expiration Date:   11/01/2020    Order Specific Question:   Reason for Exam (SYMPTOM  OR DIAGNOSIS REQUIRED)    Answer:   History of nephrolithiasis    Order Specific Question:   Preferred imaging location?    Answer:   [facility]      Return in about 1 year (around 11/01/2020) for with renal US. .   CCClaretta Fraise, MD      Theresa Hunt 11/02/2019

## 2019-11-01 NOTE — Patient Instructions (Signed)
Pressure Injury  A pressure injury is damage to the skin and underlying tissue that results from pressure being applied to an area of the body. It often affects people who must spend a long time in a bed or chair because of a medical condition. Pressure injuries usually occur:  Over bony parts of the body, such as the tailbone, shoulders, elbows, hips, heels, spine, ankles, and back of the head.  Under medical devices that make contact with the body, such as respiratory equipment, stockings, tubes, and splints. Pressure injuries start as reddened areas on the skin and can lead to pain and an open wound. What are the causes? This condition is caused by frequent or constant pressure to an area of the body. Decreased blood flow to the skin can eventually cause the skin tissue to die and break down, causing a wound. What increases the risk? You are more likely to develop this condition if you:  Are in the hospital or an extended care facility.  Are bedridden or in a wheelchair.  Have an injury or disease that keeps you from: ? Moving normally. ? Feeling pain or pressure.  Have a condition that: ? Makes you sleepy or less alert. ? Causes poor blood flow.  Need to wear a medical device.  Have poor control of your bladder or bowel functions (incontinence).  Have poor nutrition (malnutrition). If you are at risk for pressure injuries, your health care provider may recommend certain types of mattresses, mattress covers, pillows, cushions, or boots to help prevent them. These may include products filled with air, foam, gel, or sand. What are the signs or symptoms? Symptoms of this condition depend on the severity of the injury. Symptoms may include:  Red or dark areas of the skin.  Pain, warmth, or a change of skin texture.  Blisters.  An open wound. How is this diagnosed? This condition is diagnosed with a medical history and physical exam. You may also have tests, such as:  Blood  tests.  Imaging tests.  Blood flow tests. Your pressure injury will be staged based on its severity. Staging is based on:  The depth of the tissue injury, including whether there is exposure of muscle, bone, or tendon.  The cause of the pressure injury. How is this treated? This condition may be treated by:  Relieving or redistributing pressure on your skin. This includes: ? Frequently changing your position. ? Avoiding positions that caused the wound or that can make the wound worse. ? Using specific bed mattresses, chair cushions, or protective boots. ? Moving medical devices from an area of pressure, or placing padding between the skin and the device. ? Using foams, creams, or powders to prevent rubbing (friction) on the skin.  Keeping your skin clean and dry. This may include using a skin cleanser or skin barrier as told by your health care provider.  Cleaning your injury and removing any dead tissue from the wound (debridement).  Placing a bandage (dressing) over your injury.  Using medicines for pain or to prevent or treat infection. Surgery may be needed if other treatments are not working or if your injury is very deep. Follow these instructions at home: Wound care  Follow instructions from your health care provider about how to take care of your wound. Make sure you: ? Wash your hands with soap and water before and after you change your bandage (dressing). If soap and water are not available, use hand sanitizer. ? Change your dressing as told   by your health care provider.  Check your wound every day for signs of infection. Have a caregiver do this for you if you are not able. Check for: ? Redness, swelling, or increased pain. ? More fluid or blood. ? Warmth. ? Pus or a bad smell. Skin care  Keep your skin clean and dry. Gently pat your skin dry.  Do not rub or massage your skin.  You or a caregiver should check your skin every day for any changes in color or  any new blisters or sores (ulcers). Medicines  Take over-the-counter and prescription medicines only as told by your health care provider.  If you were prescribed an antibiotic medicine, take or apply it as told by your health care provider. Do not stop using the antibiotic even if your condition improves. Reducing and redistributing pressure  Do not lie or sit in one position for a long time. Move or change position every 1-2 hours, or as told by your health care provider.  Use pillows or cushions to reduce pressure. Ask your health care provider to recommend cushions or pads for you. General instructions   Eat a healthy diet that includes lots of protein.  Drink enough fluid to keep your urine pale yellow.  Be as active as you can every day. Ask your health care provider to suggest safe exercises or activities.  Do not abuse drugs or alcohol.  Do not use any products that contain nicotine or tobacco, such as cigarettes, e-cigarettes, and chewing tobacco. If you need help quitting, ask your health care provider.  Keep all follow-up visits as told by your health care provider. This is important. Contact a health care provider if:  You have: ? A fever or chills. ? Pain that is not helped by medicine. ? Any changes in skin color. ? New blisters or sores. ? Pus or a bad smell coming from your wound. ? Redness, swelling, or pain around your wound. ? More fluid or blood coming from your wound.  Your wound does not improve after 1-2 weeks of treatment. Summary  A pressure injury is damage to the skin and underlying tissue that results from pressure being applied to an area of the body.  Do not lie or sit in one position for a long time. Your health care provider may advise you to move or change position every 1-2 hours.  Follow instructions from your health care provider about how to take care of your wound.  Keep all follow-up visits as told by your health care provider. This  is important. This information is not intended to replace advice given to you by your health care provider. Make sure you discuss any questions you have with your health care provider. Document Revised: 09/28/2017 Document Reviewed: 09/28/2017 Elsevier Patient Education  2020 [employer]  

## 2019-11-01 NOTE — Progress Notes (Signed)
Established Patient Office Visit  Subjective:  Patient ID: Theresa Hunt, female    DOB: August 22, 1929  Age: 86 y.o. MRN: [id number]  CC:  Chief Complaint  Patient presents with  . sores on bottom    x 3 months but have improved     HPI Theresa Hunt is a 86 year old female who presents for follow-up stage I pressure ulcer bilateral buttocks.  This has been ongoing for 3 months.  Patient has treated pressure sores with DuoDERM 4 x 4 changing out DuoDERM every 3 days.  Patient is reporting healing skin symptoms gradually improving.  Patient reporting mild pain.  Denies fever, chills or signs and symptoms of skin infection.  Past Medical History:  Diagnosis Date  . Dementia ([place])   . Hyperlipidemia   . Hypertension   . Interstitial cystitis   . SVT (supraventricular tachycardia) (HCC)     Past Surgical History:  Procedure Laterality Date  . ABDOMINAL HYSTERECTOMY    . CHOLECYSTECTOMY    . CYSTECTOMY W/ URETEROILEAL CONDUIT    . REVISION UROSTOMY CUTANEOUS      Family History  Problem Relation Age of Onset  . Emphysema Father   . CAD Mother 18  . Cancer Daughter        Breast    Social History   Socioeconomic History  . Marital status: Widowed    Spouse name: Not on file  . Number of children: 3  . Years of education: Not on file  . Highest education level: Not on file  Occupational History  . Not on file  Tobacco Use  . Smoking status: Never Smoker  . Smokeless tobacco: Never Used  Vaping Use  . Vaping Use: Never used  Substance and Sexual Activity  . Alcohol use: No  . Drug use: No  . Sexual activity: Not on file  Other Topics Concern  . Not on file  Social History Narrative   Lives alone.    Social Determinants of Health   Financial Resource Strain:   . Difficulty of Paying Living Expenses: Not on file  Food Insecurity:   . Worried About Charity fundraiser in the Last Year: Not on file  . Ran Out of Food in the Last Year: Not on file  Transportation  Needs:   . Lack of Transportation (Medical): Not on file  . Lack of Transportation (Non-Medical): Not on file  Physical Activity:   . Days of Exercise per Week: Not on file  . Minutes of Exercise per Session: Not on file  Stress:   . Feeling of Stress : Not on file  Social Connections:   . Frequency of Communication with Friends and Family: Not on file  . Frequency of Social Gatherings with Friends and Family: Not on file  . Attends Religious Services: Not on file  . Active Member of Clubs or Organizations: Not on file  . Attends Archivist Meetings: Not on file  . Marital Status: Not on file  Intimate Partner Violence:   . Fear of Current or Ex-Partner: Not on file  . Emotionally Abused: Not on file  . Physically Abused: Not on file  . Sexually Abused: Not on file    Outpatient Medications Prior to Visit  Medication Sig Dispense Refill  . aspirin 81 MG tablet Take 81 mg by mouth daily.    . Cholecalciferol (VITAMIN D3) 50 MCG (2000 UT) capsule Take 2,000 Units by mouth daily.     Marland Kitchen  Control Gel Formula Dressing (DUODERM CGF DRESSING) MISC Apply 1 each topically every 3 (three) days. 5 each 3  . fish oil-omega-3 fatty acids 1000 MG capsule Take 1 g by mouth daily.    . metFORMIN (GLUCOPHAGE) 500 MG tablet TAKE ONE (1) TABLET EACH DAY 30 tablet 2  . metoprolol succinate (TOPROL-XL) 50 MG 24 hr tablet TAKE ONE (1) TABLET EACH DAY 30 tablet 0   No facility-administered medications prior to visit.    Allergies  Allergen Reactions  . Codeine Other (See Comments)    Pt. does not remember    ROS Review of Systems  Constitutional: Negative.   HENT: Negative.   Eyes: Negative.   Respiratory: Negative.   Cardiovascular: Negative.   Gastrointestinal: Negative.   Musculoskeletal: Negative.   Skin: Positive for color change and wound.  Neurological: Negative.       Objective:    Physical Exam Vitals reviewed.  Constitutional:      Appearance: Normal appearance.    HENT:     Head: Normocephalic.     Nose: Nose normal.  Cardiovascular:     Rate and Rhythm: Normal rate and regular rhythm.     Pulses: Normal pulses.     Heart sounds: Normal heart sounds.  Pulmonary:     Effort: Pulmonary effort is normal.     Breath sounds: Normal breath sounds.  Abdominal:     General: Bowel sounds are normal.  Musculoskeletal:        General: Normal range of motion.  Skin:    General: Skin is warm.     Findings: Erythema present.     Comments: Stage 1 pressure wound bottom  Neurological:     Mental Status: She is alert.     BP (!) 149/70   Pulse 69   Temp (!) 97.5 F (36.4 C) (Temporal)   Ht 5\' 5"  (1.651 m)   Wt 145 lb 3.2 oz (65.9 kg)   SpO2 98%   BMI 24.16 kg/m  Wt Readings from Last 3 Encounters:  11/01/19 145 lb 3.2 oz (65.9 kg)  07/13/19 140 lb (63.5 kg)  06/12/19 140 lb (63.5 kg)     Health Maintenance Due  Topic Date Due  . URINE MICROALBUMIN  Never done  . COVID-19 Vaccine (1) Never done  . TETANUS/TDAP  Never done  . DEXA SCAN  Never done  . INFLUENZA VACCINE  10/14/2019    There are no preventive care reminders to display for this patient.  Lab Results  Component Value Date   TSH 1.110 10/19/2018   Lab Results  Component Value Date   WBC 7.5 06/12/2019   HGB 11.7 (L) 06/12/2019   HCT 36.1 06/12/2019   MCV 95.5 06/12/2019   PLT 196 06/12/2019   Lab Results  Component Value Date   NA 138 06/12/2019   K 4.4 06/12/2019   CO2 25 06/12/2019   GLUCOSE 125 (H) 06/12/2019   BUN 34 (H) 06/12/2019   CREATININE 0.93 06/12/2019   BILITOT 0.3 10/19/2018   ALKPHOS 107 10/19/2018   AST 26 10/19/2018   ALT 22 10/19/2018   PROT 6.5 10/19/2018   ALBUMIN 3.8 10/19/2018   CALCIUM 9.9 06/12/2019   ANIONGAP 8 06/12/2019   No results found for: CHOL No results found for: HDL No results found for: LDLCALC No results found for: TRIG No results found for: [facility] Lab Results  Component Value Date   HGBA1C 6.7 (H) 10/19/2018       Assessment &  Plan:   Problem List Items Addressed This Visit      Musculoskeletal and Integument   Pressure injury of left buttock, stage 1 - Primary    Patient is a 86 year old female who presents for follow-up stage I pressure ulcer bilateral buttocks.  This has been ongoing for 3 months.  Patient has treated pressure sores with DuoDERM 4 x 4 changing dressing every 3 days.  Patient is reporting healing wound.  Symptoms gradually  improving.  Patient is reporting only mild pain today she denies fever chills or any signs and symptoms of skin infection.  On assessment skin is gradually healing.  Skin is scabbed over.  Skin around area is slightly erythematous.  Provided education to patient and caregiver on cleaning and keeping skin dry.  Dressing wound as directed.  Advised patient to take pressure off buttocks while sitting.  Lying on side and turning every 2-4 hours. Ordered therapeutic cushion, changed duodenum size from a 4 x 4 to a 2 x 2 for easy application. Rx sent to pharmacy. Patient follow-up as needed for worsening or unresolved symptoms.      Relevant Medications   Control Gel Formula Dressing (DUODERM CGF DRESSING) MISC   Misc. Devices (FOAM CUSHION THERAPEUTIC RING) MISC      Meds ordered this encounter  Medications  . Control Gel Formula Dressing (DUODERM CGF DRESSING) MISC    Sig: Apply 1 each topically every 3 (three) days. Size 2x2 will work better for area of skin    Dispense:  5 each    Refill:  3    Order Specific Question:   Supervising Provider    Answer:   Caryl Pina A [id number]  . Misc. Devices (FOAM CUSHION THERAPEUTIC RING) MISC    Sig: 1 each by Does not apply route as needed.    Dispense:  1 each    Refill:  0    Order Specific Question:   Supervising Provider    Answer:   Caryl Pina A [[id number]]    Follow-up: Return if symptoms worsen or fail to improve.    Ivy Lynn, NP

## 2019-11-02 ENCOUNTER — Encounter: Payer: Self-pay | Admitting: Urology

## 2019-11-02 ENCOUNTER — Ambulatory Visit (INDEPENDENT_AMBULATORY_CARE_PROVIDER_SITE_OTHER): Payer: Medicare HMO | Admitting: Urology

## 2019-11-02 VITALS — BP 147/61 | HR 69 | Temp 98.0°F | Ht 65.0 in | Wt 145.2 lb

## 2019-11-02 DIAGNOSIS — T85858D Stenosis due to other internal prosthetic devices, implants and grafts, subsequent encounter: Secondary | ICD-10-CM | POA: Diagnosis not present

## 2019-11-02 DIAGNOSIS — Z87442 Personal history of urinary calculi: Secondary | ICD-10-CM

## 2019-11-02 DIAGNOSIS — Z87440 Personal history of urinary (tract) infections: Secondary | ICD-10-CM | POA: Diagnosis not present

## 2019-11-02 NOTE — Progress Notes (Signed)
Urological Symptom Review  Patient is experiencing the following symptoms: Has urostomy bag  Review of Systems  Gastrointestinal (upper)  : Negative for upper GI symptoms  Gastrointestinal (lower) : Negative for lower GI symptoms  Constitutional : Negative for symptoms  Skin: Itching  Eyes: Negative for eye symptoms  Ear/Nose/Throat : Negative for Ear/Nose/Throat symptoms  Hematologic/Lymphatic: Negative for Hematologic/Lymphatic symptoms  Cardiovascular : Negative for cardiovascular symptoms  Respiratory : Negative for respiratory symptoms  Endocrine: Negative for endocrine symptoms  Musculoskeletal: Negative for musculoskeletal symptoms  Neurological: Negative for neurological symptoms  Psychologic: Negative for psychiatric symptoms

## 2019-11-06 ENCOUNTER — Other Ambulatory Visit: Payer: Self-pay | Admitting: Family Medicine

## 2019-11-22 ENCOUNTER — Ambulatory Visit: Payer: Medicare HMO | Admitting: Family Medicine

## 2019-11-26 ENCOUNTER — Encounter: Payer: Self-pay | Admitting: Family Medicine

## 2019-11-26 ENCOUNTER — Ambulatory Visit (INDEPENDENT_AMBULATORY_CARE_PROVIDER_SITE_OTHER): Payer: Medicare HMO | Admitting: Family Medicine

## 2019-11-26 ENCOUNTER — Other Ambulatory Visit: Payer: Self-pay

## 2019-11-26 VITALS — BP 139/70 | HR 68 | Temp 97.9°F | Ht 65.0 in | Wt 145.4 lb

## 2019-11-26 DIAGNOSIS — Z436 Encounter for attention to other artificial openings of urinary tract: Secondary | ICD-10-CM

## 2019-11-26 DIAGNOSIS — F0280 Dementia in other diseases classified elsewhere without behavioral disturbance: Secondary | ICD-10-CM

## 2019-11-26 DIAGNOSIS — G301 Alzheimer's disease with late onset: Secondary | ICD-10-CM | POA: Diagnosis not present

## 2019-11-26 DIAGNOSIS — L89152 Pressure ulcer of sacral region, stage 2: Secondary | ICD-10-CM | POA: Diagnosis not present

## 2019-11-26 DIAGNOSIS — R69 Illness, unspecified: Secondary | ICD-10-CM | POA: Diagnosis not present

## 2019-11-26 NOTE — Progress Notes (Signed)
Subjective:  Patient ID: Theresa Hunt, female    DOB: 1930/01/21  Age: 86 y.o. MRN: [id number]  CC: Referral ([place])   HPI [name] presents for referral for home health care.  She has a permanent urostomy bag.  She is unable to care for that properly.  Additionally she has been dealing with a sacral decubitus for the last 3 to 4 months.  She is putting Desitin on it but not covering it.  Stable but not resolving after this time.  Ms. Bellmore daughter accompanies her today and says that her memory is failing.  She is against the next day that she is not taking herself to bed with another turndown covers for her she is living alone.  She is able to groom herself in the mornings and dress herself.  She is able to take herself to the bathroom for bowel movements.  At this time the family would like to see her get nursing care from home health as well as home health aide if at all possible.  Depression screen [facility] 2/9 11/26/2019 11/01/2019 06/08/2019  Decreased Interest 0 0 0  Down, Depressed, Hopeless 0 0 0  PHQ - 2 Score 0 0 0  Altered sleeping - - -  Tired, decreased energy - - -  Change in appetite - - -  Feeling bad or failure about yourself  - - -  Trouble concentrating - - -  Moving slowly or fidgety/restless - - -  Suicidal thoughts - - -  PHQ-9 Score - - -  Difficult doing work/chores - - -    History Theresa Hunt has a past medical history of Dementia ([place]), Hyperlipidemia, Hypertension, Interstitial cystitis, and SVT (supraventricular tachycardia) ([place]).   She has a past surgical history that includes Abdominal hysterectomy; Cholecystectomy; Revision urostomy cutaneous; and Cystectomy w/ ureteroileal conduit.   Her family history includes CAD (age of onset: 51) in her mother; Cancer in her daughter; Emphysema in her father.She reports that she has never smoked. She has never used smokeless tobacco. She reports that she does not drink alcohol and does not use  drugs.    ROS Review of Systems  Constitutional: Negative.   HENT: Negative.   Eyes: Negative for visual disturbance.  Respiratory: Negative for shortness of breath.   Cardiovascular: Negative for chest pain.  Gastrointestinal: Negative for abdominal pain.  Musculoskeletal: Negative for arthralgias.    Objective:  BP 139/70   Pulse 68   Temp 97.9 F (36.6 C) (Temporal)   Ht 5\' 5"  (1.651 m)   Wt 145 lb 6.4 oz (66 kg)   BMI 24.20 kg/m   BP Readings from Last 3 Encounters:  11/26/19 139/70  11/02/19 (!) 147/61  11/01/19 (!) 149/70    Wt Readings from Last 3 Encounters:  11/26/19 145 lb 6.4 oz (66 kg)  11/02/19 145 lb 3.2 oz (65.9 kg)  11/01/19 145 lb 3.2 oz (65.9 kg)     Physical Exam Constitutional:      General: She is not in acute distress.    Appearance: She is well-developed.  HENT:     Head: Normocephalic and atraumatic.  Eyes:     Conjunctiva/sclera: Conjunctivae normal.     Pupils: Pupils are equal, round, and reactive to light.  Neck:     Thyroid: No thyromegaly.  Cardiovascular:     Rate and Rhythm: Normal rate and regular rhythm.     Heart sounds: Normal heart sounds. No murmur heard.   Pulmonary:  Effort: Pulmonary effort is normal. No respiratory distress.     Breath sounds: Normal breath sounds. No wheezing or rales.  Abdominal:     General: Bowel sounds are normal. There is no distension.     Palpations: Abdomen is soft.     Tenderness: There is no abdominal tenderness.     Comments: Urostomy looks clean curently at RLQ.   Musculoskeletal:        General: Normal range of motion.     Cervical back: Normal range of motion and neck supple.  Lymphadenopathy:     Cervical: No cervical adenopathy.  Skin:    General: Skin is warm and dry.  Neurological:     Mental Status: She is alert and oriented to person, place, and time.  Psychiatric:        Behavior: Behavior normal.        Thought Content: Thought content normal.        Judgment:  Judgment normal.       Assessment & Plan:   Leisel was seen today for referral.  Diagnoses and all orders for this visit:  Late onset Alzheimer's disease without behavioral disturbance ([place]) -     Ambulatory referral to [place]  Attention to urostomy [facility]) -     Ambulatory referral to [place] injury of sacral region, stage 2 [facility]) -     Ambulatory referral to [place] am having Franne Grip. Urschel maintain her aspirin, fish oil-omega-3 fatty acids, Vitamin D3, DuoDERM CGF Dressing, Foam Cushion Therapeutic Ring, metoprolol succinate, metFORMIN, and ascorbic acid.  Allergies as of 11/26/2019      Reactions   Codeine Other (See Comments)   Pt. does not remember      Medication List       Accurate as of November 26, 2019 11:59 PM. If you have any questions, ask your nurse or doctor.        aspirin 81 MG tablet Take 81 mg by mouth daily.   DuoDERM CGF Dressing Misc Apply 1 each topically every 3 (three) days. Size 2x2 will work better for area of skin   EQL Vitamin C 500 MG tablet Generic drug: ascorbic acid Take 500 mg by mouth daily.   fish oil-omega-3 fatty acids 1000 MG capsule Take 1 g by mouth daily.   Foam Cushion Therapeutic Ring Misc 1 each by Does not apply route as needed.   metFORMIN 500 MG tablet Commonly known as: GLUCOPHAGE TAKE ONE (1) TABLET EACH DAY   metoprolol succinate 50 MG 24 hr tablet Commonly known as: TOPROL-XL TAKE ONE (1) TABLET EACH DAY   Vitamin D3 50 MCG (2000 UT) capsule Take 2,000 Units by mouth daily.        Follow-up: No follow-ups on file.  [name], M.D.

## 2019-11-29 ENCOUNTER — Encounter: Payer: Self-pay | Admitting: Family Medicine

## 2019-12-10 ENCOUNTER — Other Ambulatory Visit: Payer: Self-pay | Admitting: Family Medicine

## 2019-12-17 ENCOUNTER — Encounter: Payer: Self-pay | Admitting: Family Medicine

## 2019-12-17 ENCOUNTER — Ambulatory Visit (INDEPENDENT_AMBULATORY_CARE_PROVIDER_SITE_OTHER): Payer: Medicare HMO | Admitting: Family Medicine

## 2019-12-17 ENCOUNTER — Other Ambulatory Visit: Payer: Self-pay

## 2019-12-17 VITALS — BP 135/76 | HR 72 | Temp 97.5°F | Resp 20 | Ht 65.0 in | Wt 143.0 lb

## 2019-12-17 DIAGNOSIS — Z23 Encounter for immunization: Secondary | ICD-10-CM

## 2019-12-17 DIAGNOSIS — F0390 Unspecified dementia without behavioral disturbance: Secondary | ICD-10-CM

## 2019-12-17 DIAGNOSIS — R69 Illness, unspecified: Secondary | ICD-10-CM | POA: Diagnosis not present

## 2019-12-17 MED ORDER — DONEPEZIL HCL 5 MG PO TABS: 5.0000 mg | ORAL_TABLET | Freq: Every day | ORAL | 1 refills | Status: DC

## 2019-12-17 NOTE — Progress Notes (Signed)
Subjective:  Patient ID: Theresa Hunt, female    DOB: 1929/07/12  Age: 86 y.o. MRN: [id number]  CC: Memory assessment   HPI Theresa Hunt presents for evaluation of her memory.  Also follow-up on home after referral.  She is doing better with regard to her decubitus.  Her daughter, Ms. Priddy,  Brings in a letter today stating that  she does not fix herself anything to eat and she is eating less overall.  She forgets to eat and they have found food she did not eat hitting them drawers.  Most of these are leftovers. 2.  Patient gets confused as to what she is supposed to be doing when she bathes.  She also reports very close and drawers thinking that she has washed them. 3.  She is not taking her medications without someone giving them to her.  It is unclear whether she will not take them or whether she forgets.  However with supervision she is compliant. 4.  She sits and sleeps a lot.  She stays cold and does not talk much.  She is quiet most of the time. 5.  She does not recognize relatives or friends that she does not see every day. 6.  She repeats herself a lot and asks the same questions repeatedly through the day.  There is a caregiver that stays with her from 5 PM until 9 PM.  This history is given by her daughter Danniell Rotundo Priddy  Depression screen [facility] 2/9 12/17/2019 11/26/2019 11/01/2019  Decreased Interest 0 0 0  Down, Depressed, Hopeless 1 0 0  PHQ - 2 Score 1 0 0  Altered sleeping - - -  Tired, decreased energy - - -  Change in appetite - - -  Feeling bad or failure about yourself  - - -  Trouble concentrating - - -  Moving slowly or fidgety/restless - - -  Suicidal thoughts - - -  PHQ-9 Score - - -  Difficult doing work/chores - - -    History Darius has a past medical history of Dementia ([place]), Hyperlipidemia, Hypertension, Interstitial cystitis, and SVT (supraventricular tachycardia) ([place]).   She has a past surgical history that includes Abdominal hysterectomy;  Cholecystectomy; Revision urostomy cutaneous; and Cystectomy w/ ureteroileal conduit.   Her family history includes CAD (age of onset: 73) in her mother; Cancer in her daughter; Emphysema in her father.She reports that she has never smoked. She has never used smokeless tobacco. She reports that she does not drink alcohol and does not use drugs.    ROS Review of Systems  Constitutional: Negative.   HENT: Negative.   Eyes: Negative for visual disturbance.  Respiratory: Negative for shortness of breath.   Cardiovascular: Negative for chest pain.  Gastrointestinal: Negative for abdominal pain.  Musculoskeletal: Negative for arthralgias.  Neurological: Negative for dizziness and headaches.       Forgetful   Psychiatric/Behavioral: Positive for confusion and decreased concentration.   MMSE - Mini Mental State Exam 12/17/2019 10/02/2018 09/13/2018  Orientation to time 1 0 2  Orientation to Place 3 3 5   Registration 3 3 3   Attention/ Calculation 5 5 5   Recall 0 0 0  Language- name 2 objects 2 2 2   Language- repeat 1 1 1   Language- follow 3 step command 3 3 3   Language- read & follow direction 1 1 1   Write a sentence 0 1 1  Copy design 0 0 1  Total score 19 19 24  Objective:  BP 135/76   Pulse 72   Temp (!) 97.5 F (36.4 C) (Temporal)   Resp 20   Ht 5\' 5"  (1.651 m)   Wt 143 lb (64.9 kg)   SpO2 96%   BMI 23.80 kg/m   BP Readings from Last 3 Encounters:  12/17/19 135/76  11/26/19 139/70  11/02/19 (!) 147/61    Wt Readings from Last 3 Encounters:  12/17/19 143 lb (64.9 kg)  11/26/19 145 lb 6.4 oz (66 kg)  11/02/19 145 lb 3.2 oz (65.9 kg)     Physical Exam Constitutional:      General: She is not in acute distress.    Appearance: She is well-developed.  Cardiovascular:     Rate and Rhythm: Normal rate and regular rhythm.  Pulmonary:     Breath sounds: Normal breath sounds.  Skin:    General: Skin is warm and dry.  Neurological:     Mental Status: She is alert  and oriented to person, place, and time.       Assessment & Plan:   Charnise was seen today for memory assessment.  Diagnoses and all orders for this visit:  Need for immunization against influenza -     Flu Vaccine QUAD High Dose(Fluad)  Other orders -     donepezil (ARICEPT) 5 MG tablet; Take 1 tablet (5 mg total) by mouth at bedtime.       I have discontinued Franne Grip. Merk's fish oil-omega-3 fatty acids. I am also having her start on donepezil. Additionally, I am having her maintain her aspirin, Vitamin D3, DuoDERM CGF Dressing, Foam Cushion Therapeutic Ring, ascorbic acid, metoprolol succinate, and metFORMIN.  Allergies as of 12/17/2019      Reactions   Codeine Other (See Comments)   Pt. does not remember      Medication List       Accurate as of December 17, 2019  2:02 PM. If you have any questions, ask your nurse or doctor.        STOP taking these medications   fish oil-omega-3 fatty acids 1000 MG capsule Stopped by: [name], MD     TAKE these medications   aspirin 81 MG tablet Take 81 mg by mouth daily.   donepezil 5 MG tablet Commonly known as: Aricept Take 1 tablet (5 mg total) by mouth at bedtime. Started by: [name], MD   DuoDERM CGF Dressing Misc Apply 1 each topically every 3 (three) days. Size 2x2 will work better for area of skin   EQL Vitamin C 500 MG tablet Generic drug: ascorbic acid Take 500 mg by mouth daily.   Foam Cushion Therapeutic Ring Misc 1 each by Does not apply route as needed.   metFORMIN 500 MG tablet Commonly known as: GLUCOPHAGE TAKE ONE (1) TABLET EACH DAY   metoprolol succinate 50 MG 24 hr tablet Commonly known as: TOPROL-XL TAKE ONE (1) TABLET EACH DAY   Vitamin D3 50 MCG (2000 UT) capsule Take 2,000 Units by mouth daily.      Re- challenge with donepezil. May need namenda as well this time.  Follow-up: Return in about 1 month (around 01/17/2020).  [name], M.D.

## 2020-01-04 ENCOUNTER — Telehealth: Payer: Self-pay

## 2020-01-04 NOTE — Telephone Encounter (Signed)
lmtcb

## 2020-01-08 ENCOUNTER — Telehealth: Payer: Self-pay

## 2020-01-08 ENCOUNTER — Other Ambulatory Visit: Payer: Self-pay | Admitting: Family Medicine

## 2020-01-08 NOTE — Telephone Encounter (Signed)
A rep from [employer] called asking if we had received a fax from [facility] requesting a copy of chart notes for pt. Theresa Ramp, RN, said she had sent these back this morning. Rep asked for my name and title for confirmation.

## 2020-01-17 ENCOUNTER — Telehealth: Payer: Self-pay

## 2020-01-17 ENCOUNTER — Ambulatory Visit (INDEPENDENT_AMBULATORY_CARE_PROVIDER_SITE_OTHER): Payer: Medicare HMO | Admitting: Family Medicine

## 2020-01-17 ENCOUNTER — Other Ambulatory Visit: Payer: Self-pay

## 2020-01-17 ENCOUNTER — Encounter: Payer: Self-pay | Admitting: Family Medicine

## 2020-01-17 VITALS — BP 132/74 | HR 77 | Temp 97.6°F | Resp 20 | Ht 65.0 in | Wt 146.0 lb

## 2020-01-17 DIAGNOSIS — F0280 Dementia in other diseases classified elsewhere without behavioral disturbance: Secondary | ICD-10-CM | POA: Diagnosis not present

## 2020-01-17 DIAGNOSIS — R69 Illness, unspecified: Secondary | ICD-10-CM | POA: Diagnosis not present

## 2020-01-17 DIAGNOSIS — Z936 Other artificial openings of urinary tract status: Secondary | ICD-10-CM | POA: Diagnosis not present

## 2020-01-17 DIAGNOSIS — G301 Alzheimer's disease with late onset: Secondary | ICD-10-CM

## 2020-01-17 DIAGNOSIS — I10 Essential (primary) hypertension: Secondary | ICD-10-CM

## 2020-01-17 MED ORDER — MEMANTINE HCL 28 X 5 MG & 21 X 10 MG PO TABS: ORAL_TABLET | ORAL | 12 refills | Status: DC

## 2020-01-17 MED ORDER — MEMANTINE HCL 28 X 5 MG & 21 X 10 MG PO TABS: ORAL_TABLET | ORAL | 0 refills | Status: DC

## 2020-01-17 NOTE — Progress Notes (Signed)
Subjective:  Patient ID: Theresa Hunt, female    DOB: [birth year]  Age: 86 y.o. MRN: [id number]  CC: One month follow up   HPI Theresa Hunt presents for recheck of her dementia.  She has small hallucinations with the donepezil so it was discontinued.  Much of the history is given by her daughter.  This is the second challenge with this medicine and the other having been a year to 2 years ago.  She is also tried Exelon and had problems with the cost last year.  She is continuing to have memory issues.  Her MMSE score is 19 out of 30.  Her daughter and she have decided that it is time for her to go into an assisted living facility and need an FL to complete for her.  Her daughter tells me that she has been scratching around the stoma of the ileal conduit routinely and frequently.  Depression screen [facility] 2/9 01/17/2020 12/17/2019 11/26/2019  Decreased Interest 0 0 0  Down, Depressed, Hopeless 0 1 0  PHQ - 2 Score 0 1 0  Altered sleeping - - -  Tired, decreased energy - - -  Change in appetite - - -  Feeling bad or failure about yourself  - - -  Trouble concentrating - - -  Moving slowly or fidgety/restless - - -  Suicidal thoughts - - -  PHQ-9 Score - - -  Difficult doing work/chores - - -    History Theresa Hunt has a past medical history of Dementia ([place]), Hyperlipidemia, Hypertension, Interstitial cystitis, and SVT (supraventricular tachycardia) ([place]).   She has a past surgical history that includes Abdominal hysterectomy; Cholecystectomy; Revision urostomy cutaneous; and Cystectomy w/ ureteroileal conduit.   Her family history includes CAD (age of onset: 106) in her mother; Cancer in her daughter; Emphysema in her father.She reports that she has never smoked. She has never used smokeless tobacco. She reports that she does not drink alcohol and does not use drugs.    ROS Review of Systems  Constitutional: Negative.   HENT: Negative.   Eyes: Negative for visual disturbance.  Respiratory:  Negative for shortness of breath.   Cardiovascular: Negative for chest pain.  Gastrointestinal: Negative for abdominal pain.  Musculoskeletal: Negative for arthralgias.    Objective:  BP 132/74   Pulse 77   Temp 97.6 F (36.4 C) (Temporal)   Resp 20   Ht 5\' 5"  (1.651 m)   Wt 146 lb (66.2 kg)   SpO2 99%   BMI 24.30 kg/m   BP Readings from Last 3 Encounters:  01/17/20 132/74  12/17/19 135/76  11/26/19 139/70    Wt Readings from Last 3 Encounters:  01/17/20 146 lb (66.2 kg)  12/17/19 143 lb (64.9 kg)  11/26/19 145 lb 6.4 oz (66 kg)     Physical Exam Constitutional:      General: She is not in acute distress.    Appearance: She is well-developed.  Cardiovascular:     Rate and Rhythm: Normal rate and regular rhythm.  Pulmonary:     Breath sounds: Normal breath sounds.  Skin:    General: Skin is warm and dry.  Neurological:     Mental Status: She is alert and oriented to person, place, and time.    There is no lesion noted in the region of the ileal conduit.   Assessment & Plan:   [name] was seen today for one month follow up.  Diagnoses and all orders for this visit:  Late onset Alzheimer's dementia without behavioral disturbance ([place])  Essential hypertension with goal blood pressure less than 130/80  S/P ileal conduit ([facility])  Other orders -     memantine (NAMENDA TITRATION PACK) tablet pack; 5 mg/day for =1 week; 5 mg twice daily for =1 week; 15 mg/day given in 5 mg and 10 mg separated doses for =1 week; then 10 mg twice daily       I have discontinued Franne Grip. Takeshita's donepezil. I am also having her start on memantine. Additionally, I am having her maintain her aspirin, Vitamin D3, DuoDERM CGF Dressing, Foam Cushion Therapeutic Ring, ascorbic acid, metFORMIN, and metoprolol succinate.  Allergies as of 01/17/2020      Reactions   Codeine Other (See Comments)   Pt. does not remember   Donepezil Other (See Comments)   hallucinations      Medication  List       Accurate as of January 17, 2020  3:27 PM. If you have any questions, ask your nurse or doctor.        STOP taking these medications   donepezil 5 MG tablet Commonly known as: Aricept Stopped by: [name], MD     TAKE these medications   aspirin 81 MG tablet Take 81 mg by mouth daily.   DuoDERM CGF Dressing Misc Apply 1 each topically every 3 (three) days. Size 2x2 will work better for area of skin   EQL Vitamin C 500 MG tablet Generic drug: ascorbic acid Take 500 mg by mouth daily.   Foam Cushion Therapeutic Ring Misc 1 each by Does not apply route as needed.   memantine tablet pack Commonly known as: NAMENDA TITRATION PACK 5 mg/day for =1 week; 5 mg twice daily for =1 week; 15 mg/day given in 5 mg and 10 mg separated doses for =1 week; then 10 mg twice daily Started by: [name], MD   metFORMIN 500 MG tablet Commonly known as: GLUCOPHAGE TAKE ONE (1) TABLET EACH DAY   metoprolol succinate 50 MG 24 hr tablet Commonly known as: TOPROL-XL TAKE ONE (1) TABLET EACH DAY   Vitamin D3 50 MCG (2000 UT) capsule Take 2,000 Units by mouth daily.        Follow-up: No follow-ups on file.  [name], M.D.

## 2020-01-18 ENCOUNTER — Ambulatory Visit: Payer: Medicare HMO | Admitting: Urology

## 2020-01-20 ENCOUNTER — Encounter: Payer: Self-pay | Admitting: Family Medicine

## 2020-01-22 ENCOUNTER — Telehealth: Payer: Self-pay

## 2020-01-22 NOTE — Telephone Encounter (Signed)
Aware ready to be picked up

## 2020-01-31 DIAGNOSIS — Z936 Other artificial openings of urinary tract status: Secondary | ICD-10-CM | POA: Diagnosis not present

## 2020-02-08 ENCOUNTER — Other Ambulatory Visit: Payer: Self-pay | Admitting: Family Medicine

## 2020-02-08 ENCOUNTER — Emergency Department (HOSPITAL_COMMUNITY): Payer: Medicare HMO

## 2020-02-08 ENCOUNTER — Inpatient Hospital Stay (HOSPITAL_COMMUNITY)
Admission: EM | Admit: 2020-02-08 | Discharge: 2020-02-15 | DRG: 872 | Disposition: A | Discharge status: Skilled Nursing Facility | Payer: Medicare HMO | Attending: Family Medicine | Admitting: Family Medicine

## 2020-02-08 ENCOUNTER — Other Ambulatory Visit: Payer: Self-pay

## 2020-02-08 ENCOUNTER — Encounter (HOSPITAL_COMMUNITY): Payer: Self-pay | Admitting: Emergency Medicine

## 2020-02-08 DIAGNOSIS — Z87440 Personal history of urinary (tract) infections: Secondary | ICD-10-CM | POA: Diagnosis not present

## 2020-02-08 DIAGNOSIS — Z1611 Resistance to penicillins: Secondary | ICD-10-CM | POA: Diagnosis not present

## 2020-02-08 DIAGNOSIS — F0280 Dementia in other diseases classified elsewhere without behavioral disturbance: Secondary | ICD-10-CM

## 2020-02-08 DIAGNOSIS — E1165 Type 2 diabetes mellitus with hyperglycemia: Secondary | ICD-10-CM | POA: Diagnosis not present

## 2020-02-08 DIAGNOSIS — A419 Sepsis, unspecified organism: Secondary | ICD-10-CM | POA: Diagnosis present

## 2020-02-08 DIAGNOSIS — D696 Thrombocytopenia, unspecified: Secondary | ICD-10-CM | POA: Diagnosis present

## 2020-02-08 DIAGNOSIS — R404 Transient alteration of awareness: Secondary | ICD-10-CM | POA: Diagnosis not present

## 2020-02-08 DIAGNOSIS — Z7984 Long term (current) use of oral hypoglycemic drugs: Secondary | ICD-10-CM | POA: Diagnosis not present

## 2020-02-08 DIAGNOSIS — G301 Alzheimer's disease with late onset: Secondary | ICD-10-CM | POA: Diagnosis not present

## 2020-02-08 DIAGNOSIS — R410 Disorientation, unspecified: Secondary | ICD-10-CM | POA: Diagnosis not present

## 2020-02-08 DIAGNOSIS — R531 Weakness: Secondary | ICD-10-CM | POA: Diagnosis not present

## 2020-02-08 DIAGNOSIS — Z885 Allergy status to narcotic agent status: Secondary | ICD-10-CM | POA: Diagnosis not present

## 2020-02-08 DIAGNOSIS — Z20822 Contact with and (suspected) exposure to covid-19: Secondary | ICD-10-CM | POA: Diagnosis present

## 2020-02-08 DIAGNOSIS — Z888 Allergy status to other drugs, medicaments and biological substances status: Secondary | ICD-10-CM

## 2020-02-08 DIAGNOSIS — E119 Type 2 diabetes mellitus without complications: Secondary | ICD-10-CM | POA: Diagnosis present

## 2020-02-08 DIAGNOSIS — L89322 Pressure ulcer of left buttock, stage 2: Secondary | ICD-10-CM | POA: Diagnosis not present

## 2020-02-08 DIAGNOSIS — F0390 Unspecified dementia without behavioral disturbance: Secondary | ICD-10-CM | POA: Diagnosis present

## 2020-02-08 DIAGNOSIS — R0689 Other abnormalities of breathing: Secondary | ICD-10-CM | POA: Diagnosis not present

## 2020-02-08 DIAGNOSIS — N390 Urinary tract infection, site not specified: Secondary | ICD-10-CM | POA: Diagnosis not present

## 2020-02-08 DIAGNOSIS — Z90710 Acquired absence of both cervix and uterus: Secondary | ICD-10-CM | POA: Diagnosis not present

## 2020-02-08 DIAGNOSIS — W19XXXA Unspecified fall, initial encounter: Secondary | ICD-10-CM | POA: Diagnosis not present

## 2020-02-08 DIAGNOSIS — R6520 Severe sepsis without septic shock: Secondary | ICD-10-CM | POA: Diagnosis not present

## 2020-02-08 DIAGNOSIS — Z9049 Acquired absence of other specified parts of digestive tract: Secondary | ICD-10-CM

## 2020-02-08 DIAGNOSIS — I10 Essential (primary) hypertension: Secondary | ICD-10-CM | POA: Diagnosis not present

## 2020-02-08 DIAGNOSIS — N3090 Cystitis, unspecified without hematuria: Secondary | ICD-10-CM

## 2020-02-08 DIAGNOSIS — E860 Dehydration: Secondary | ICD-10-CM | POA: Diagnosis not present

## 2020-02-08 DIAGNOSIS — A4151 Sepsis due to Escherichia coli [E. coli]: Secondary | ICD-10-CM | POA: Diagnosis not present

## 2020-02-08 DIAGNOSIS — R0902 Hypoxemia: Secondary | ICD-10-CM | POA: Diagnosis not present

## 2020-02-08 DIAGNOSIS — I471 Supraventricular tachycardia, unspecified: Secondary | ICD-10-CM | POA: Diagnosis present

## 2020-02-08 DIAGNOSIS — E785 Hyperlipidemia, unspecified: Secondary | ICD-10-CM | POA: Diagnosis not present

## 2020-02-08 DIAGNOSIS — L8990 Pressure ulcer of unspecified site, unspecified stage: Secondary | ICD-10-CM | POA: Insufficient documentation

## 2020-02-08 DIAGNOSIS — Z7982 Long term (current) use of aspirin: Secondary | ICD-10-CM

## 2020-02-08 DIAGNOSIS — E871 Hypo-osmolality and hyponatremia: Secondary | ICD-10-CM | POA: Diagnosis not present

## 2020-02-08 DIAGNOSIS — N3010 Interstitial cystitis (chronic) without hematuria: Secondary | ICD-10-CM | POA: Diagnosis present

## 2020-02-08 DIAGNOSIS — R69 Illness, unspecified: Secondary | ICD-10-CM | POA: Diagnosis not present

## 2020-02-08 DIAGNOSIS — Z906 Acquired absence of other parts of urinary tract: Secondary | ICD-10-CM

## 2020-02-08 DIAGNOSIS — M6281 Muscle weakness (generalized): Secondary | ICD-10-CM | POA: Diagnosis not present

## 2020-02-08 DIAGNOSIS — R262 Difficulty in walking, not elsewhere classified: Secondary | ICD-10-CM | POA: Diagnosis not present

## 2020-02-08 DIAGNOSIS — R488 Other symbolic dysfunctions: Secondary | ICD-10-CM | POA: Diagnosis not present

## 2020-02-08 DIAGNOSIS — I517 Cardiomegaly: Secondary | ICD-10-CM | POA: Diagnosis not present

## 2020-02-08 DIAGNOSIS — Z79899 Other long term (current) drug therapy: Secondary | ICD-10-CM

## 2020-02-08 DIAGNOSIS — N99524 Stenosis of incontinent stoma of urinary tract: Secondary | ICD-10-CM | POA: Diagnosis present

## 2020-02-08 DIAGNOSIS — E876 Hypokalemia: Secondary | ICD-10-CM | POA: Diagnosis not present

## 2020-02-08 DIAGNOSIS — R7881 Bacteremia: Secondary | ICD-10-CM | POA: Diagnosis not present

## 2020-02-08 DIAGNOSIS — Z435 Encounter for attention to cystostomy: Secondary | ICD-10-CM | POA: Diagnosis not present

## 2020-02-08 DIAGNOSIS — R279 Unspecified lack of coordination: Secondary | ICD-10-CM | POA: Diagnosis not present

## 2020-02-08 DIAGNOSIS — T85858D Stenosis due to other internal prosthetic devices, implants and grafts, subsequent encounter: Secondary | ICD-10-CM

## 2020-02-08 LAB — COMPREHENSIVE METABOLIC PANEL
ALT: 25 U/L (ref 0–44)
AST: 35 U/L (ref 15–41)
Albumin: 3.8 g/dL (ref 3.5–5.0)
Alkaline Phosphatase: 106 U/L (ref 38–126)
Anion gap: 10 (ref 5–15)
BUN: 29 mg/dL — ABNORMAL HIGH (ref 8–23)
CO2: 23 mmol/L (ref 22–32)
Calcium: 9.7 mg/dL (ref 8.9–10.3)
Chloride: 98 mmol/L (ref 98–111)
Creatinine, Ser: 1.08 mg/dL — ABNORMAL HIGH (ref 0.44–1.00)
GFR, Estimated: 49 mL/min — ABNORMAL LOW (ref >60)
Glucose, Bld: 253 mg/dL — ABNORMAL HIGH (ref 70–99)
Potassium: 3.5 mmol/L (ref 3.5–5.1)
Sodium: 131 mmol/L — ABNORMAL LOW (ref 135–145)
Total Bilirubin: 0.7 mg/dL (ref 0.3–1.2)
Total Protein: 7.2 g/dL (ref 6.5–8.1)

## 2020-02-08 LAB — URINALYSIS, ROUTINE W REFLEX MICROSCOPIC
Bilirubin Urine: NEGATIVE
Glucose, UA: 50 mg/dL — AB
Ketones, ur: NEGATIVE mg/dL
Nitrite: POSITIVE — AB
Protein, ur: 100 mg/dL — AB
RBC / HPF: >50 RBC/hpf — ABNORMAL HIGH (ref 0–5)
Specific Gravity, Urine: 1.009 (ref 1.005–1.030)
pH: 7 (ref 5.0–8.0)

## 2020-02-08 LAB — TROPONIN I (HIGH SENSITIVITY)
Troponin I (High Sensitivity): 12 ng/L (ref <18)
Troponin I (High Sensitivity): 21 ng/L — ABNORMAL HIGH (ref <18)

## 2020-02-08 LAB — PROTIME-INR
INR: 1.1 (ref 0.8–1.2)
Prothrombin Time: 13.7 seconds (ref 11.4–15.2)

## 2020-02-08 LAB — CBC WITH DIFFERENTIAL/PLATELET
Abs Immature Granulocytes: 0.14 10*3/uL — ABNORMAL HIGH (ref 0.00–0.07)
Basophils Absolute: 0 10*3/uL (ref 0.0–0.1)
Basophils Relative: 0 %
Eosinophils Absolute: 0 10*3/uL (ref 0.0–0.5)
Eosinophils Relative: 0 %
HCT: 42.1 % (ref 36.0–46.0)
Hemoglobin: 14 g/dL (ref 12.0–15.0)
Immature Granulocytes: 1 %
Lymphocytes Relative: 3 %
Lymphs Abs: 0.4 10*3/uL — ABNORMAL LOW (ref 0.7–4.0)
MCH: 30.3 pg (ref 26.0–34.0)
MCHC: 33.3 g/dL (ref 30.0–36.0)
MCV: 91.1 fL (ref 80.0–100.0)
Monocytes Absolute: 1 10*3/uL (ref 0.1–1.0)
Monocytes Relative: 6 %
Neutro Abs: 13.6 10*3/uL — ABNORMAL HIGH (ref 1.7–7.7)
Neutrophils Relative %: 90 %
Platelets: 140 10*3/uL — ABNORMAL LOW (ref 150–400)
RBC: 4.62 MIL/uL (ref 3.87–5.11)
RDW: 12.9 % (ref 11.5–15.5)
WBC: 15.1 10*3/uL — ABNORMAL HIGH (ref 4.0–10.5)
nRBC: 0 % (ref 0.0–0.2)

## 2020-02-08 LAB — RESP PANEL BY RT-PCR (FLU A&B, COVID) ARPGX2
Influenza A by PCR: NEGATIVE
Influenza B by PCR: NEGATIVE
SARS Coronavirus 2 by RT PCR: NEGATIVE

## 2020-02-08 LAB — CBG MONITORING, ED: Glucose-Capillary: 205 mg/dL — ABNORMAL HIGH (ref 70–99)

## 2020-02-08 LAB — LACTIC ACID, PLASMA
Lactic Acid, Venous: 1.8 mmol/L (ref 0.5–1.9)
Lactic Acid, Venous: 2.8 mmol/L (ref 0.5–1.9)
Lactic Acid, Venous: 1.3 mmol/L (ref 0.5–1.9)

## 2020-02-08 LAB — APTT: aPTT: 30 seconds (ref 24–36)

## 2020-02-08 LAB — CK: Total CK: 29 U/L — ABNORMAL LOW (ref 38–234)

## 2020-02-08 MED ORDER — VANCOMYCIN HCL 1250 MG/250ML IV SOLN
1250.0000 mg | Freq: Once | INTRAVENOUS | Status: AC
  Administered 2020-02-08: 1250 mg via INTRAVENOUS
  Filled 2020-02-08: qty 250

## 2020-02-08 MED ORDER — ACETAMINOPHEN 325 MG PO TABS
650.0000 mg | ORAL_TABLET | Freq: Once | ORAL | Status: AC
  Administered 2020-02-08: 650 mg via ORAL
  Filled 2020-02-08: qty 2

## 2020-02-08 MED ORDER — SODIUM CHLORIDE 0.9 % IV SOLN
2.0000 g | Freq: Once | INTRAVENOUS | Status: AC
  Administered 2020-02-08: 2 g via INTRAVENOUS
  Filled 2020-02-08: qty 2

## 2020-02-08 MED ORDER — LACTATED RINGERS IV BOLUS (SEPSIS)
1000.0000 mL | Freq: Once | INTRAVENOUS | Status: AC
  Administered 2020-02-08: 1000 mL via INTRAVENOUS

## 2020-02-08 MED ORDER — POLYETHYLENE GLYCOL 3350 17 G PO PACK: 17.0000 g | PACK | Freq: Every day | ORAL | Status: DC | PRN

## 2020-02-08 MED ORDER — SODIUM CHLORIDE 0.9 % IV SOLN
2.0000 g | Freq: Two times a day (BID) | INTRAVENOUS | Status: DC
  Administered 2020-02-09 – 2020-02-10 (×3): 2 g via INTRAVENOUS
  Filled 2020-02-08 (×4): qty 2

## 2020-02-08 MED ORDER — LORATADINE 10 MG PO TABS
5.0000 mg | ORAL_TABLET | Freq: Every day | ORAL | Status: DC
  Administered 2020-02-09 – 2020-02-15 (×6): 5 mg via ORAL
  Filled 2020-02-08 (×7): qty 1

## 2020-02-08 MED ORDER — SODIUM CHLORIDE 0.9 % IV SOLN: 250.0000 mL | INTRAVENOUS | Status: DC | PRN

## 2020-02-08 MED ORDER — TRAZODONE HCL 50 MG PO TABS
50.0000 mg | ORAL_TABLET | Freq: Every day | ORAL | Status: DC
  Administered 2020-02-08 – 2020-02-14 (×6): 50 mg via ORAL
  Filled 2020-02-08 (×7): qty 1

## 2020-02-08 MED ORDER — ONDANSETRON HCL 4 MG/2ML IJ SOLN: 4.0000 mg | Freq: Four times a day (QID) | INTRAMUSCULAR | Status: DC | PRN

## 2020-02-08 MED ORDER — ASPIRIN EC 81 MG PO TBEC
81.0000 mg | DELAYED_RELEASE_TABLET | Freq: Every day | ORAL | Status: DC
  Administered 2020-02-09 – 2020-02-15 (×6): 81 mg via ORAL
  Filled 2020-02-08 (×7): qty 1

## 2020-02-08 MED ORDER — INSULIN ASPART 100 UNIT/ML ~~LOC~~ SOLN
0.0000 [IU] | Freq: Every day | SUBCUTANEOUS | Status: DC
  Administered 2020-02-08: 2 [IU] via SUBCUTANEOUS
  Filled 2020-02-08: qty 1

## 2020-02-08 MED ORDER — LACTATED RINGERS IV SOLN: INTRAVENOUS | Status: DC

## 2020-02-08 MED ORDER — SODIUM CHLORIDE 0.9% FLUSH
3.0000 mL | Freq: Two times a day (BID) | INTRAVENOUS | Status: DC
  Administered 2020-02-08 – 2020-02-14 (×7): 3 mL via INTRAVENOUS

## 2020-02-08 MED ORDER — METRONIDAZOLE IN NACL 5-0.79 MG/ML-% IV SOLN
500.0000 mg | Freq: Once | INTRAVENOUS | Status: AC
  Administered 2020-02-08: 500 mg via INTRAVENOUS
  Filled 2020-02-08: qty 100

## 2020-02-08 MED ORDER — METOPROLOL SUCCINATE ER 25 MG PO TB24
50.0000 mg | ORAL_TABLET | Freq: Every day | ORAL | Status: DC
  Administered 2020-02-09: 50 mg via ORAL
  Filled 2020-02-08: qty 2

## 2020-02-08 MED ORDER — HEPARIN SODIUM (PORCINE) 5000 UNIT/ML IJ SOLN
5000.0000 [IU] | Freq: Three times a day (TID) | INTRAMUSCULAR | Status: DC
  Administered 2020-02-08 – 2020-02-14 (×17): 5000 [IU] via SUBCUTANEOUS
  Filled 2020-02-08 (×20): qty 1

## 2020-02-08 MED ORDER — SODIUM CHLORIDE 0.9% FLUSH: 3.0000 mL | INTRAVENOUS | Status: DC | PRN

## 2020-02-08 MED ORDER — INSULIN ASPART 100 UNIT/ML ~~LOC~~ SOLN
0.0000 [IU] | Freq: Three times a day (TID) | SUBCUTANEOUS | Status: DC
  Administered 2020-02-09 (×2): 1 [IU] via SUBCUTANEOUS
  Administered 2020-02-09: 2 [IU] via SUBCUTANEOUS
  Administered 2020-02-11: 1 [IU] via SUBCUTANEOUS
  Administered 2020-02-12: 2 [IU] via SUBCUTANEOUS
  Administered 2020-02-12 – 2020-02-14 (×5): 1 [IU] via SUBCUTANEOUS
  Filled 2020-02-08 (×3): qty 1

## 2020-02-08 MED ORDER — ACETAMINOPHEN 650 MG RE SUPP: 650.0000 mg | Freq: Four times a day (QID) | RECTAL | Status: DC | PRN

## 2020-02-08 MED ORDER — VITAMIN D 25 MCG (1000 UNIT) PO TABS
2000.0000 [IU] | ORAL_TABLET | Freq: Every day | ORAL | Status: DC
  Administered 2020-02-09 – 2020-02-15 (×6): 2000 [IU] via ORAL
  Filled 2020-02-08 (×7): qty 2

## 2020-02-08 MED ORDER — SODIUM CHLORIDE 0.9% FLUSH
3.0000 mL | Freq: Two times a day (BID) | INTRAVENOUS | Status: DC
  Administered 2020-02-08 – 2020-02-10 (×3): 3 mL via INTRAVENOUS

## 2020-02-08 MED ORDER — BISACODYL 10 MG RE SUPP: 10.0000 mg | Freq: Every day | RECTAL | Status: DC | PRN

## 2020-02-08 MED ORDER — ACETAMINOPHEN 325 MG PO TABS: 650.0000 mg | ORAL_TABLET | Freq: Four times a day (QID) | ORAL | Status: DC | PRN

## 2020-02-08 MED ORDER — VANCOMYCIN HCL IN DEXTROSE 1-5 GM/200ML-% IV SOLN: 1000.0000 mg | Freq: Once | INTRAVENOUS | Status: DC

## 2020-02-08 MED ORDER — ASCORBIC ACID 500 MG PO TABS
500.0000 mg | ORAL_TABLET | Freq: Every day | ORAL | Status: DC
  Administered 2020-02-09 – 2020-02-15 (×6): 500 mg via ORAL
  Filled 2020-02-08 (×7): qty 1

## 2020-02-08 MED ORDER — TRAZODONE HCL 50 MG PO TABS: 50.0000 mg | ORAL_TABLET | Freq: Every evening | ORAL | Status: DC | PRN

## 2020-02-08 MED ORDER — ONDANSETRON HCL 4 MG PO TABS: 4.0000 mg | ORAL_TABLET | Freq: Four times a day (QID) | ORAL | Status: DC | PRN

## 2020-02-08 MED ORDER — MEMANTINE HCL 10 MG PO TABS
5.0000 mg | ORAL_TABLET | Freq: Every day | ORAL | Status: DC
  Administered 2020-02-09 – 2020-02-15 (×6): 5 mg via ORAL
  Filled 2020-02-08 (×7): qty 1

## 2020-02-08 MED ORDER — VANCOMYCIN HCL IN DEXTROSE 1-5 GM/200ML-% IV SOLN: 1000.0000 mg | INTRAVENOUS | Status: DC

## 2020-02-08 MED ORDER — POLYETHYLENE GLYCOL 3350 17 G PO PACK
17.0000 g | PACK | Freq: Every day | ORAL | Status: DC
  Administered 2020-02-08 – 2020-02-15 (×6): 17 g via ORAL
  Filled 2020-02-08 (×7): qty 1

## 2020-02-08 NOTE — ED Provider Notes (Signed)
[facility] EMERGENCY DEPARTMENT Provider Note   CSN: [id number] Arrival date & time: 02/08/20  1654     History Chief Complaint  Patient presents with  . Weakness    Theresa Hunt is a 86 y.o. female w/ dementia, urostomy bag, diabetes (not on insulin), HTN, HLD presenting from home after being found down on ground by family members today.  The patient cannot provide any history due to dementia but her son at bedside provides history.  He reports the patient has been living at home with daily caretakers due to her dementia for a long time.  Yesterday she was at Thanksgiving dinner with her daughter, she was noted to be "weaker than normal and a little dazed".  This morning the son went to visit her at 8 AM and found her lying on her bedroom floor, sleeping.  He does not know how long she had been lying there.  He helped her up and got her some breakfast, and then afterwards she only wanted to sleep all day in bed.  He reports that he noted she had a fever at home.  He says she does get UTIs, approximately once per year.  She had a urostomy bag for many years due to "shrunken bladder".  He denies history of bladder cancer.  Her common UTI symptom is confusion.  He denies that she has had coughing or congestion recently.  He reports she did receive both Covid vaccines.  Last positive UTI per records on 10/09/18 with 100K E. Coli, resistent to unasyn and ampicillin and bactrim.    HPI     Past Medical History:  Diagnosis Date  . Dementia ([place])   . Hyperlipidemia   . Hypertension   . Interstitial cystitis   . SVT (supraventricular tachycardia) [facility])     Patient Active Problem List   Diagnosis Date Noted  . Sepsis ([place]) 02/08/2020  . HTN (hypertension) 02/08/2020  . H/o Paroxysmal SVT (supraventricular tachycardia) ([place]) 02/08/2020  . Pressure injury of left buttock, stage 1 11/01/2019  . 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 Nephrolithiasis  04/06/2019  . History of removal of part of urinary tract 04/06/2019  . History of urinary tract infection 04/06/2019  . UTI (urinary tract infection) 10/09/2018  . Interstitial cystitis/ s/p Prior Cystectomy with Uretero-ileaconduit   . Dementia ([place]) 04/11/2015  . Essential hypertension with goal blood pressure less than 130/80 04/23/2014  . Memory loss 04/23/2014  . Hyperlipidemia 10/22/2013  . SVT (supraventricular tachycardia) ([place]) 09/06/2012    Past Surgical History:  Procedure Laterality Date  . ABDOMINAL HYSTERECTOMY    . CHOLECYSTECTOMY    . CYSTECTOMY W/ URETEROILEAL CONDUIT    . REVISION UROSTOMY CUTANEOUS       OB History   No obstetric history on file.     Family History  Problem Relation Age of Onset  . Emphysema Father   . CAD Mother 68  . Cancer Daughter        Breast    Social History   Tobacco Use  . Smoking status: Never Smoker  . Smokeless tobacco: Never Used  Vaping Use  . Vaping Use: Never used  Substance Use Topics  . Alcohol use: No  . Drug use: No    Home Medications Prior to Admission medications   Medication Sig Start Date End Date Taking? Authorizing Provider  ascorbic acid (EQL VITAMIN C) 500 MG tablet Take 500 mg by mouth daily.   Yes [provider]  aspirin 81 MG tablet Take 81 mg by mouth daily.   Yes [provider]  Cholecalciferol (VITAMIN D3) 50 MCG (2000 UT) capsule Take 2,000 Units by mouth daily.    Yes [provider]  Loratadine 5 MG TBDP Take 1 tablet by mouth daily.   Yes [provider]  memantine (NAMENDA) 10 MG tablet Take 10 mg by mouth daily. Take 1/2 tablet by mouth daily 01/17/20  Yes [provider]  metFORMIN (GLUCOPHAGE) 500 MG tablet TAKE ONE (1) TABLET EACH DAY Patient taking differently: Take 500 mg by mouth daily with breakfast.  01/08/20  Yes Stacks, Cletus Gash, MD  metoprolol succinate (TOPROL-XL) 50 MG 24 hr tablet TAKE ONE (1) TABLET EACH DAY Patient taking  differently: Take 50 mg by mouth daily.  01/08/20  Yes [name], MD  Control Gel Formula Dressing (DUODERM CGF DRESSING) MISC Apply 1 each topically every 3 (three) days. Size 2x2 will work better for area of skin 11/01/19   Ivy Lynn, NP  memantine [facility] TITRATION PACK) tablet pack 5 mg/day for =1 week; 5 mg twice daily for =1 week; 15 mg/day given in 5 mg and 10 mg separated doses for =1 week; then 10 mg twice daily Patient not taking: Reported on 02/08/2020 01/17/20   [name], MD  Misc. Devices (FOAM CUSHION THERAPEUTIC RING) MISC 1 each by Does not apply route as needed. 11/01/19   Ivy Lynn, NP    Allergies    Codeine and Donepezil  Review of Systems   Review of Systems  Unable to perform ROS: Dementia (level 5 caveat)    Physical Exam Updated Vital Signs BP (!) 106/55   Pulse 100   Temp 98.4 F (36.9 C) (Oral)   Resp (!) 21   Ht 5\' 5"  (1.651 m)   Wt 66.2 kg   SpO2 92%   BMI 24.29 kg/m   Physical Exam Vitals and nursing note reviewed.  Constitutional:      General: She is not in acute distress.    Appearance: She is well-developed.     Comments: Pleasantly demented, NAD  HENT:     Head: Normocephalic and atraumatic.  Eyes:     Conjunctiva/sclera: Conjunctivae normal.     Pupils: Pupils are equal, round, and reactive to light.  Cardiovascular:     Rate and Rhythm: Regular rhythm. Tachycardia present.     Pulses: Normal pulses.  Pulmonary:     Effort: Pulmonary effort is normal. No respiratory distress.     Breath sounds: Normal breath sounds.  Abdominal:     General: There is no distension.     Palpations: Abdomen is soft.     Tenderness: There is no abdominal tenderness.     Comments: Urostomy bag with dark urine  Musculoskeletal:     Cervical back: Neck supple.  Skin:    General: Skin is warm and dry.  Neurological:     General: No focal deficit present.     Mental Status: She is alert. Mental status is at baseline.     ED  Results / Procedures / Treatments   Labs (all labs ordered are listed, but only abnormal results are displayed) Labs Reviewed  LACTIC ACID, PLASMA - Abnormal; Notable for the following components:      Result Value   Lactic Acid, Venous 2.8 (*)    All other components within normal limits  COMPREHENSIVE METABOLIC PANEL - Abnormal; Notable for the following components:   Sodium 131 (*)  Glucose, Bld 253 (*)    BUN 29 (*)    Creatinine, Ser 1.08 (*)    GFR, Estimated 49 (*)    All other components within normal limits  CBC WITH DIFFERENTIAL/PLATELET - Abnormal; Notable for the following components:   WBC 15.1 (*)    Platelets 140 (*)    Neutro Abs 13.6 (*)    Lymphs Abs 0.4 (*)    Abs Immature Granulocytes 0.14 (*)    All other components within normal limits  URINALYSIS, ROUTINE W REFLEX MICROSCOPIC - Abnormal; Notable for the following components:   APPearance HAZY (*)    Glucose, UA 50 (*)    Hgb urine dipstick LARGE (*)    Protein, ur 100 (*)    Nitrite POSITIVE (*)    Leukocytes,Ua MODERATE (*)    RBC / HPF >50 (*)    Bacteria, UA MANY (*)    All other components within normal limits  CK - Abnormal; Notable for the following components:   Total CK 29 (*)    All other components within normal limits  CBG MONITORING, ED - Abnormal; Notable for the following components:   Glucose-Capillary 205 (*)    All other components within normal limits  TROPONIN I (HIGH SENSITIVITY) - Abnormal; Notable for the following components:   Troponin I (High Sensitivity) 21 (*)    All other components within normal limits  CULTURE, BLOOD (ROUTINE X 2)  CULTURE, BLOOD (ROUTINE X 2)  RESP PANEL BY RT-PCR (FLU A&B, COVID) ARPGX2  URINE CULTURE  LACTIC ACID, PLASMA  PROTIME-INR  APTT  LACTIC ACID, PLASMA  BASIC METABOLIC PANEL  CBC  HEMOGLOBIN A1C  TROPONIN I (HIGH SENSITIVITY)    EKG None  Radiology DG Chest Port 1 View  Result Date: 02/08/2020 CLINICAL DATA:  Possible  sepsis found on floor EXAM: PORTABLE CHEST 1 VIEW COMPARISON:  Report 05/25/2012 FINDINGS: No consolidation or effusion. Mild cardiomegaly with aortic atherosclerosis. Diffuse coarse interstitial opacity likely chronic bronchitic change. Ovoid opacity in the right hilus likely due to hilar vessel. No pneumothorax IMPRESSION: No active cardiopulmonary disease. Mild cardiomegaly with probable chronic bronchitic changes. Ovoid opacity in the right hilus suspected to be secondary to hilar vessel. Electronically Signed   By: Donavan Foil M.D.   On: 02/08/2020 18:00    Procedures .Critical Care Performed by: Wyvonnia Dusky, MD Authorized by: Wyvonnia Dusky, MD   Critical care provider statement:    Critical care time (minutes):  45   Critical care was necessary to treat or prevent imminent or life-threatening deterioration of the following conditions:  Sepsis   Critical care was time spent personally by me on the following activities:  Discussions with consultants, evaluation of patient's response to treatment, examination of patient, ordering and performing treatments and interventions, ordering and review of laboratory studies, ordering and review of radiographic studies, pulse oximetry, re-evaluation of patient's condition, obtaining history from patient or surrogate and review of old charts   (including critical care time)  Medications Ordered in ED Medications  ceFEPIme (MAXIPIME) 2 g in sodium chloride 0.9 % 100 mL IVPB (has no administration in time range)  lactated ringers infusion ( Intravenous New Bag/Given 02/08/20 2046)  aspirin EC tablet 81 mg (has no administration in time range)  metoprolol succinate (TOPROL-XL) 24 hr tablet 50 mg (has no administration in time range)  memantine (NAMENDA) tablet 5 mg (has no administration in time range)  ascorbic acid (VITAMIN C) tablet 500 mg (has no administration  in time range)  cholecalciferol (VITAMIN D3) tablet 2,000 Units (has no  administration in time range)  loratadine (CLARITIN) tablet 5 mg (has no administration in time range)  sodium chloride flush (NS) 0.9 % injection 3 mL (3 mLs Intravenous Given 02/08/20 2140)  sodium chloride flush (NS) 0.9 % injection 3 mL (3 mLs Intravenous Given 02/08/20 2139)  sodium chloride flush (NS) 0.9 % injection 3 mL (has no administration in time range)  0.9 %  sodium chloride infusion (has no administration in time range)  acetaminophen (TYLENOL) tablet 650 mg (has no administration in time range)    Or  acetaminophen (TYLENOL) suppository 650 mg (has no administration in time range)  bisacodyl (DULCOLAX) suppository 10 mg (has no administration in time range)  ondansetron (ZOFRAN) tablet 4 mg (has no administration in time range)    Or  ondansetron (ZOFRAN) injection 4 mg (has no administration in time range)  heparin injection 5,000 Units (5,000 Units Subcutaneous Given 02/08/20 2131)  insulin aspart (novoLOG) injection 0-6 Units (has no administration in time range)  insulin aspart (novoLOG) injection 0-5 Units (2 Units Subcutaneous Given 02/08/20 2132)  traZODone (DESYREL) tablet 50 mg (50 mg Oral Given 02/08/20 2131)  polyethylene glycol (MIRALAX / GLYCOLAX) packet 17 g (17 g Oral Given 02/08/20 2132)  lactated ringers bolus 1,000 mL (0 mLs Intravenous Stopped 02/08/20 1941)    And  lactated ringers bolus 1,000 mL (0 mLs Intravenous Stopped 02/08/20 1947)  ceFEPIme (MAXIPIME) 2 g in sodium chloride 0.9 % 100 mL IVPB (0 g Intravenous Stopped 02/08/20 1859)  metroNIDAZOLE (FLAGYL) IVPB 500 mg (0 mg Intravenous Stopped 02/08/20 2048)  vancomycin (VANCOREADY) IVPB 1250 mg/250 mL (0 mg Intravenous Stopped 02/08/20 2021)  acetaminophen (TYLENOL) tablet 650 mg (650 mg Oral Given 02/08/20 2026)    ED Course  I have reviewed the triage vital signs and the nursing notes.  Pertinent labs & imaging results that were available during my care of the patient were reviewed by me and  considered in my medical decision making (see chart for details).  This patient presents with complaint for confusion, weakness, lethargy x 2 days  This involves an extensive number of treatment options, and is a complaint that carries with it a high risk of complications and morbidity.  The differential diagnosis includes sepsis vs covid illness vs anemia vs metabolic encephalopathy vs atypical ACS vs other  Theresa Hunt was evaluated in Emergency Department on 02/09/2020 for the symptoms described in the history of present illness. She was evaluated in the context of the global COVID-19 pandemic, which necessitated consideration that the patient might be at risk for infection with the SARS-CoV-2 virus that causes COVID-19. Institutional protocols and algorithms that pertain to the evaluation of patients at risk for COVID-19 are in a state of rapid change based on information released by regulatory bodies including the CDC and federal and state organizations. These policies and algorithms were followed during the patient's care in the ED.  Sepsis alert   I ordered, reviewed, and interpreted labs, which included lactate 2.8, WBC 15.1, CK 29.  UA with nitrties, leuk.s I ordered medication Vanc, Cefepime, Flagyl for empiric sepsis coverage, 30 cc/kg fluid bolus I ordered imaging studies which included dg chest I independently visualized and interpreted imaging which showed no focal PNA and the monitor tracing which showed sinus rhythm Additional history was obtained from son at bedside   After the interventions stated above, I reevaluated the patient and found vitals stable, patient  comfortable.   Clinical Course as of Feb 08 45  Fri Feb 08, 2020  2013 Will admit for urosepsis    [MT]    Clinical Course User Index [MT] Langston Masker Carola Rhine, MD    Final Clinical Impression(s) / ED Diagnoses Final diagnoses:  Sepsis, due to unspecified organism, unspecified whether acute organ dysfunction  present [facility])  Cystitis    Rx / DC Orders ED Discharge Orders    None       Wyvonnia Dusky, MD 02/09/20 [phone]

## 2020-02-08 NOTE — ED Notes (Addendum)
Date and time results received: 02/08/20 0804   Test: Lactic Acid Critical Value: 2.8  Name of Provider Notified: Trifan  Orders Received? Or Actions Taken?: NA

## 2020-02-08 NOTE — H&P (Signed)
Patient Demographics:    Theresa Hunt, is a 86 y.o. female  MRN: [id number]   DOB - April 29, 1929  Admit Date - 02/08/2020  Outpatient Primary MD for the patient is [name], MD   Assessment & Plan:    Principal Problem:   Sepsis [facility]) Active Problems:   Interstitial cystitis/ s/p Prior Cystectomy with Uretero-ileaconduit   UTI (urinary tract infection)   Ileal conduit stomal stenosis, subsequent encounter   Dementia ([place])   HTN (hypertension)   H/o Paroxysmal SVT (supraventricular tachycardia) (HCC)  1)Sepsis --suspect UTI related, no H/o ESBL organisms recently, patient already received Flagyl/Vanco/cefepime -Received IV fluids per sepsis protocol -Lactic acid up to 2.8 from 1.8, repeat lactic acid after IV fluids pending -Okay to treat empirically with iv Cefepime pending further culture data -Patient met sepsis criteria on admission with leukocytosis, fevers, tachycardia and tachypnea in the setting of UTI-  2)S/p Fall--no obvious injuries, get PT eval -PTA patient lived alone with caregivers checking on her  3)UTI--- patient with history of recurrent UTIs,  s/p Prior Cystectomy with Uretero-ileaconduit----no recent ESBL infections antibiotics as above #1 =-Will need to change urostomy bag this admission  4)HTN and H/o PSVT-- Stable, continue metoprolol  5)DM2-previously well controlled, no recent A1c, hold metformin -Use Novolog/Humalog Sliding scale insulin with Accu-Cheks/Fingersticks as ordered   6)Dementia-- baseline cognitive and memory deficits, c/n [place]   7)HypoNatremia-- Na is 131, suspect due to dehydration, c/n LR  8)Thrombocytopenia--platelets are 140 K, monitor closely no evidence of bleeding at this time hemoglobin is stable at 14  Disposition/Need for in-Hospital Stay- patient  unable to be discharged at this time due to --sepsis secondary to UTI requiring IV fluids and IV antibiotics pending further culture data*  Status is: Inpatient  Remains inpatient appropriate because:sepsis secondary to UTI requiring IV fluids and IV antibiotics pending further culture data*   Dispo: The patient is from: Home              Anticipated d/c is to: Home              Anticipated d/c date is: 2 days              Patient currently is not medically stable to d/c. Barriers: Not Clinically Stable- sepsis secondary to UTI requiring IV fluids and IV antibiotics pending further culture data*   With History of - Reviewed by me  Past Medical History:  Diagnosis Date  . Dementia ([place])   . Hyperlipidemia   . Hypertension   . Interstitial cystitis   . SVT (supraventricular tachycardia) (HCC)       Past Surgical History:  Procedure Laterality Date  . ABDOMINAL HYSTERECTOMY    . CHOLECYSTECTOMY    . CYSTECTOMY W/ URETEROILEAL CONDUIT    . REVISION UROSTOMY CUTANEOUS      Chief Complaint  Patient presents with  . Weakness      HPI:    Theresa Hunt  is a 86 y.o. female with past medical history relevant for of dementia, Interstitial cystitis/ s/p Prior Cystectomy with Uretero-ileaconduity, HLD, HTN, history of paroxysmal SVT, and diabetes mellitus presenting to the ED via EMS with fevers up to 102.3 after being found on the floor home -- History is limited due to underlying dementia -It is unclear how long patient has been on the floor, patient was somewhat disoriented and confused, however no obvious injuries noted -Patient repeatedly denies any pain -Work-up in the ED reveals chest x-ray showed without acute findings -Troponin of 21 -Initial lactic acid was 1.8, repeat lactic acid was 2.8--patient received IV fluid boluses -Total CK only 29 -Chemistry with a sodium of 131 glucose is 253 creatinine is 1.08 with a BUN of 29, LFTs are not elevated -Remarkable for white  count of 15.1 and platelets of 140 hemoglobin is 14 -From urostomy bag suggestive of UTI--- -EDP obtain urine culture and blood cultures and give IV cefepime vancomycin and Flagyl  Review of systems:    In addition to the HPI above,   A full Review of  Systems was done, all other systems reviewed are negative except as noted above in HPI , .    Social History:  Reviewed by me    Social History   Tobacco Use  . Smoking status: Never Smoker  . Smokeless tobacco: Never Used  Substance Use Topics  . Alcohol use: No     Family History :  Reviewed by me    Family History  Problem Relation Age of Onset  . Emphysema Father   . CAD Mother 78  . Cancer Daughter        Breast     Home Medications:   Prior to Admission medications   Medication Sig Start Date End Date Taking? Authorizing Provider  ascorbic acid (EQL VITAMIN C) 500 MG tablet Take 500 mg by mouth daily.   Yes [provider]  aspirin 81 MG tablet Take 81 mg by mouth daily.   Yes [provider]  Cholecalciferol (VITAMIN D3) 50 MCG (2000 UT) capsule Take 2,000 Units by mouth daily.    Yes [provider]  Loratadine 5 MG TBDP Take 1 tablet by mouth daily.   Yes [provider]  memantine (NAMENDA) 10 MG tablet Take 10 mg by mouth daily. Take 1/2 tablet by mouth daily 01/17/20  Yes [provider]  metFORMIN (GLUCOPHAGE) 500 MG tablet TAKE ONE (1) TABLET EACH DAY Patient taking differently: Take 500 mg by mouth daily with breakfast.  01/08/20  Yes Stacks, Cletus Gash, MD  metoprolol succinate (TOPROL-XL) 50 MG 24 hr tablet TAKE ONE (1) TABLET EACH DAY Patient taking differently: Take 50 mg by mouth daily.  01/08/20  Yes [name], MD  Control Gel Formula Dressing (DUODERM CGF DRESSING) MISC Apply 1 each topically every 3 (three) days. Size 2x2 will work better for area of skin 11/01/19   Ivy Lynn, NP  memantine [facility] TITRATION PACK) tablet pack 5 mg/day for =1  week; 5 mg twice daily for =1 week; 15 mg/day given in 5 mg and 10 mg separated doses for =1 week; then 10 mg twice daily Patient not taking: Reported on 02/08/2020 01/17/20   [name], MD  Misc. Devices (FOAM CUSHION THERAPEUTIC RING) MISC 1 each by Does not apply route as needed. 11/01/19   Ivy Lynn, NP     Allergies:     Allergies  Allergen Reactions  . Codeine Other (See  Comments)    Pt. does not remember  . Donepezil Other (See Comments)    hallucinations     Physical Exam:   Vitals  Blood pressure 123/62, pulse 90, temperature 98.4 F (36.9 C), temperature source Oral, resp. rate (!) 22, height $RemoveBe'5\' 5"'nNzapOAoC$  (1.651 m), weight 66.2 kg, SpO2 92 %.  -Temp:  [98.4 F (36.9 C)-102.3 F (39.1 C)] 98.4 F (36.9 C) (11/26 2034) Pulse Rate:  [87-96] 89 (11/26 2100) Resp:  [19-34] 19 (11/26 2100) BP: (108-147)/(55-68) 108/55 (11/26 2100) SpO2:  [92 %-95 %] 92 % (11/26 2100) Weight:  [66.2 kg] 66.2 kg (11/26 1731)   Physical Examination: General appearance - alert, and in no distress  Mental status - alert, oriented to person, place, , baseline memory and cognitive deficits noted Eyes - sclera anicteric Neck - supple, no JVD elevation , Chest - clear  to auscultation bilaterally, symmetrical air movement,  Heart - S1 and S2 normal, regular  Abdomen - soft, nontender, nondistended, ileal conduit/urostomy bag with somewhat darkish, smelly urine, no CVA area tenderness Neurological -moving all extremities, generalized weakness, no new focal deficits  extremities - no pedal edema noted, intact peripheral pulses  Skin - warm, dry     Data Review:    CBC Recent Labs  Lab 02/08/20 1718  WBC 15.1*  HGB 14.0  HCT 42.1  PLT 140*  MCV 91.1  MCH 30.3  MCHC 33.3  RDW 12.9  LYMPHSABS 0.4*  MONOABS 1.0  EOSABS 0.0  BASOSABS 0.0   ------------------------------------------------------------------------------------------------------------------  Chemistries  Recent  Labs  Lab 02/08/20 1718  NA 131*  K 3.5  CL 98  CO2 23  GLUCOSE 253*  BUN 29*  CREATININE 1.08*  CALCIUM 9.7  AST 35  ALT 25  ALKPHOS 106  BILITOT 0.7   ------------------------------------------------------------------------------------------------------------------ estimated creatinine clearance is 32.4 mL/min (A) (by C-G formula based on SCr of 1.08 mg/dL (H)). ------------------------------------------------------------------------------------------------------ Coagulation profile Recent Labs  Lab 02/08/20 1718  INR 1.1    Urinalysis    Component Value Date/Time   COLORURINE YELLOW 02/08/2020 1722   APPEARANCEUR HAZY (A) 02/08/2020 1722   APPEARANCEUR Cloudy (A) 09/13/2018 1414   LABSPEC 1.009 02/08/2020 1722   PHURINE 7.0 02/08/2020 1722   GLUCOSEU 50 (A) 02/08/2020 1722   HGBUR LARGE (A) 02/08/2020 1722   BILIRUBINUR NEGATIVE 02/08/2020 1722   BILIRUBINUR Negative 09/13/2018 1414   KETONESUR NEGATIVE 02/08/2020 1722   PROTEINUR 100 (A) 02/08/2020 1722   NITRITE POSITIVE (A) 02/08/2020 1722   LEUKOCYTESUR MODERATE (A) 02/08/2020 1722    ----------------------------------------------------------------------------------------------------------------   Imaging Results:    DG Chest Port 1 View  Result Date: 02/08/2020 CLINICAL DATA:  Possible sepsis found on floor EXAM: PORTABLE CHEST 1 VIEW COMPARISON:  Report 05/25/2012 FINDINGS: No consolidation or effusion. Mild cardiomegaly with aortic atherosclerosis. Diffuse coarse interstitial opacity likely chronic bronchitic change. Ovoid opacity in the right hilus likely due to hilar vessel. No pneumothorax IMPRESSION: No active cardiopulmonary disease. Mild cardiomegaly with probable chronic bronchitic changes. Ovoid opacity in the right hilus suspected to be secondary to hilar vessel. Electronically Signed   By: Donavan Foil M.D.   On: 02/08/2020 18:00    Radiological Exams on Admission: DG Chest Port 1  View  Result Date: 02/08/2020 CLINICAL DATA:  Possible sepsis found on floor EXAM: PORTABLE CHEST 1 VIEW COMPARISON:  Report 05/25/2012 FINDINGS: No consolidation or effusion. Mild cardiomegaly with aortic atherosclerosis. Diffuse coarse interstitial opacity likely chronic bronchitic change. Ovoid opacity in the right hilus  likely due to hilar vessel. No pneumothorax IMPRESSION: No active cardiopulmonary disease. Mild cardiomegaly with probable chronic bronchitic changes. Ovoid opacity in the right hilus suspected to be secondary to hilar vessel. Electronically Signed   By: Donavan Foil M.D.   On: 02/08/2020 18:00    DVT Prophylaxis -SCD/heparin AM Labs Ordered, also please review Full Orders  Family Communication: Admission, patients condition and plan of care including tests being ordered have been discussed with the patient who indicate understanding and agree with the plan   Code Status - Full Code  Likely DC to home after resolution of sepsis pathophysiology  Condition   stable  Roxan Hockey M.D on 02/08/2020 at 8:58 PM Go to [email or web address] -  for contact info  [facility] - Office  [phone]

## 2020-02-08 NOTE — Progress Notes (Signed)
Pharmacy Antibiotic Note  Theresa Hunt is a 86 y.o. female admitted on 02/08/2020 found down and febrile with concerns for sepsis. Pharmacy has been consulted for vancomycin and cefepime dosing. WBC elevated, SCr up slightly from baseline.  Plan: Cefepime 2g IV x1 then q12h Vancomycin 1250mg  IV x1 then 1000mg  q24h Watch Cr, LOT, cultures   Height: 5\' 5"  (165.1 cm) Weight: 66.2 kg (145 lb 15.1 oz) IBW/kg (Calculated) : 57  No data recorded.  No results for input(s): WBC, CREATININE, LATICACIDVEN, VANCOTROUGH, VANCOPEAK, VANCORANDOM, GENTTROUGH, GENTPEAK, GENTRANDOM, TOBRATROUGH, TOBRAPEAK, TOBRARND, AMIKACINPEAK, AMIKACINTROU, AMIKACIN in the last 168 hours.  CrCl cannot be calculated (Patient's most recent lab result is older than the maximum 21 days allowed.).    Allergies  Allergen Reactions  . Codeine Other (See Comments)    Pt. does not remember  . Donepezil Other (See Comments)    hallucinations    Antimicrobials this admission: Vancomycin 11/26 >> Cefepime 11/16 >>  Microbiology results: pending  Thank you for allowing pharmacy to be a part of this patient's care.  Arrie Senate, PharmD, BCPS, [facility] Clinical Pharmacist Please check AMION for all [place] numbers 02/08/2020

## 2020-02-08 NOTE — ED Triage Notes (Signed)
Pt from home via [facility]. Per family on scene pt was found this morning lying in the floor of her home sleeping. Unsure how long pt had been on the floor. Pt confused at this time but family stating that is her normal. Pt denies pain. No lacerations or bruises noted to the patient. Pt was febrile at 102.3.

## 2020-02-09 DIAGNOSIS — N390 Urinary tract infection, site not specified: Secondary | ICD-10-CM | POA: Diagnosis not present

## 2020-02-09 DIAGNOSIS — G301 Alzheimer's disease with late onset: Secondary | ICD-10-CM | POA: Diagnosis not present

## 2020-02-09 DIAGNOSIS — R7881 Bacteremia: Secondary | ICD-10-CM

## 2020-02-09 DIAGNOSIS — A419 Sepsis, unspecified organism: Secondary | ICD-10-CM | POA: Diagnosis not present

## 2020-02-09 DIAGNOSIS — I471 Supraventricular tachycardia: Secondary | ICD-10-CM | POA: Diagnosis not present

## 2020-02-09 LAB — BLOOD CULTURE ID PANEL (REFLEXED) - BCID2

## 2020-02-09 LAB — CBC
HCT: 38.3 % (ref 36.0–46.0)
Hemoglobin: 12.6 g/dL (ref 12.0–15.0)
MCH: 30.1 pg (ref 26.0–34.0)
MCHC: 32.9 g/dL (ref 30.0–36.0)
MCV: 91.4 fL (ref 80.0–100.0)
Platelets: 106 10*3/uL — ABNORMAL LOW (ref 150–400)
RBC: 4.19 MIL/uL (ref 3.87–5.11)
RDW: 13 % (ref 11.5–15.5)
WBC: 12.3 10*3/uL — ABNORMAL HIGH (ref 4.0–10.5)
nRBC: 0 % (ref 0.0–0.2)

## 2020-02-09 LAB — BASIC METABOLIC PANEL
Anion gap: 7 (ref 5–15)
BUN: 24 mg/dL — ABNORMAL HIGH (ref 8–23)
CO2: 24 mmol/L (ref 22–32)
Calcium: 9.2 mg/dL (ref 8.9–10.3)
Chloride: 104 mmol/L (ref 98–111)
Creatinine, Ser: 0.85 mg/dL (ref 0.44–1.00)
GFR, Estimated: >60 mL/min (ref >60)
Glucose, Bld: 181 mg/dL — ABNORMAL HIGH (ref 70–99)
Potassium: 2.9 mmol/L — ABNORMAL LOW (ref 3.5–5.1)
Sodium: 135 mmol/L (ref 135–145)

## 2020-02-09 LAB — CBG MONITORING, ED
Glucose-Capillary: 171 mg/dL — ABNORMAL HIGH (ref 70–99)
Glucose-Capillary: 214 mg/dL — ABNORMAL HIGH (ref 70–99)
Glucose-Capillary: 224 mg/dL — ABNORMAL HIGH (ref 70–99)
Glucose-Capillary: 176 mg/dL — ABNORMAL HIGH (ref 70–99)
Glucose-Capillary: 181 mg/dL — ABNORMAL HIGH (ref 70–99)

## 2020-02-09 LAB — HEMOGLOBIN A1C
Hgb A1c MFr Bld: 7.1 % — ABNORMAL HIGH (ref 4.8–5.6)
Mean Plasma Glucose: 157.07 mg/dL

## 2020-02-09 LAB — GLUCOSE, CAPILLARY: Glucose-Capillary: 108 mg/dL — ABNORMAL HIGH (ref 70–99)

## 2020-02-09 MED ORDER — POTASSIUM CHLORIDE CRYS ER 20 MEQ PO TBCR
40.0000 meq | EXTENDED_RELEASE_TABLET | ORAL | Status: AC
  Administered 2020-02-09 (×2): 40 meq via ORAL
  Filled 2020-02-09 (×3): qty 2

## 2020-02-09 MED ORDER — MORPHINE SULFATE (PF) 2 MG/ML IV SOLN
1.0000 mg | Freq: Once | INTRAVENOUS | Status: AC
  Administered 2020-02-09: 1 mg via INTRAVENOUS
  Filled 2020-02-09: qty 1

## 2020-02-09 MED ORDER — METOPROLOL SUCCINATE ER 50 MG PO TB24
50.0000 mg | ORAL_TABLET | Freq: Two times a day (BID) | ORAL | Status: DC
  Administered 2020-02-09 – 2020-02-11 (×3): 50 mg via ORAL
  Filled 2020-02-09 (×5): qty 1

## 2020-02-09 MED ORDER — METOPROLOL TARTRATE 5 MG/5ML IV SOLN
2.5000 mg | Freq: Once | INTRAVENOUS | Status: AC
  Administered 2020-02-09: 2.5 mg via INTRAVENOUS
  Filled 2020-02-09: qty 5

## 2020-02-09 MED ORDER — LORAZEPAM 2 MG/ML IJ SOLN
0.5000 mg | Freq: Three times a day (TID) | INTRAMUSCULAR | Status: DC | PRN
  Administered 2020-02-10: 0.5 mg via INTRAVENOUS
  Filled 2020-02-09: qty 1

## 2020-02-09 MED ORDER — LORAZEPAM 2 MG/ML IJ SOLN
0.5000 mg | Freq: Once | INTRAMUSCULAR | Status: DC
  Filled 2020-02-09: qty 1

## 2020-02-09 NOTE — ED Notes (Signed)
CRITICAL VALUE ALERT  Critical Value:  + blood culture  Date & Time Notied:  02/09/2020 1511  Provider Notified: Dr. Dyann Kief  Orders Received/Actions taken: see chart

## 2020-02-09 NOTE — Progress Notes (Signed)
3:40 AM RN called regarding patient's heart rate in 120s, EKG was requested and it showed sinus tachycardia at a rate of 124 bpm.  At bedside, patient was not in any acute distress, but she had some hand tremors.  She appeared anxious and due to tachypnea with RR of 35/min, IV morphine 1 mg x1 was given with improved Resp rate to 17/min.  HR at 96/min (from 128/min).  O2 sat at 92% on room air. We shall continue to monitor patient and treat accordingly.

## 2020-02-09 NOTE — ED Notes (Signed)
Date and time results received: 02/09/20 0818 (use smartphrase ".now" to insert current time)  Test: Blood Culture Aerobic/Anaerobic Critical Value: Gram Negative Rods Name of Provider Notified: Dr Dyann Kief Orders Received? Or Actions Taken?: NA

## 2020-02-09 NOTE — ED Notes (Addendum)
Pt in bed, meal tray given, pt is picking at dressing on L arm, pt no longer has iv in L forearm, pt states that she doesn't know what happened to it.  Iv with cath intact found in bed.

## 2020-02-09 NOTE — ED Notes (Addendum)
Pt bp trending up. Notified the doc and waiting for response.

## 2020-02-09 NOTE — ED Notes (Addendum)
Pt has opening to left buttocks. Spoke with daughter. Area is not new  Daughter states aide has ben putting patch to buttocks

## 2020-02-09 NOTE — ED Notes (Signed)
Hospitalist at bedside. Per Hospitalist, 0.5mg  Ativan verbally held, and recommended try 1mg  Morphine. Ativan unable to be returned to Pyxis and vial placed in Med Return [name].

## 2020-02-09 NOTE — ED Notes (Addendum)
Paged Dr Josephine Cables and spoke with him on the phone. Told him pt blood pressure is 207/101 and heart rate 128. Was told to obtain an EKG. Dr to come see pt momentarily.

## 2020-02-09 NOTE — Progress Notes (Signed)
PROGRESS NOTE    Theresa Hunt  VZC:[id number] DOB: 1929-11-09 DOA: 02/08/2020 PCP: [name], MD   Chief Complaint  Patient presents with  . Weakness    Brief Narrative:  As per H&P written by Dr. Denton Brick on 02/08/2020 Theresa Hunt  is a 86 y.o. female with past medical history relevant for of dementia, Interstitial cystitis/ s/p Prior Cystectomy with Uretero-ileaconduity, HLD, HTN, history of paroxysmal SVT, and diabetes mellitus presenting to the ED via EMS with fevers up to 102.3 after being found on the floor home -- History is limited due to underlying dementia -It is unclear how long patient has been on the floor, patient was somewhat disoriented and confused, however no obvious injuries noted -Patient repeatedly denies any pain -Work-up in the ED reveals chest x-ray showed without acute findings -Troponin of 21 -Initial lactic acid was 1.8, repeat lactic acid was 2.8--patient received IV fluid boluses -Total CK only 29 -Chemistry with a sodium of 131 glucose is 253 creatinine is 1.08 with a BUN of 29, LFTs are not elevated -Remarkable for white count of 15.1 and platelets of 140 hemoglobin is 14 -From urostomy bag suggestive of UTI--- -EDP obtain urine culture and blood cultures and give IV cefepime vancomycin and Flagyl.  Assessment & Plan: 1-sepsis [facility]): Present on admission -In the setting of UTI and bacteremia -Continue IV fluids, IV antibiotics, antipyretics and as needed antiemetics. -Will continue supportive care and follow clinical response -Follow speciation and sensitivity out of cultures taken  2-Interstitial cystitis/ s/p Prior Cystectomy with Uretero-ileaconduit -Urostomy bag in place -No signs of hematuria -Continue treatment for UTI as mentioned above.  3-Dementia ([place]) -Continue supportive care and constant reorientation -Resume the use of Namenda  4-HTN (hypertension) -Continue metoprolol  5-H/o Paroxysmal SVT (supraventricular tachycardia)  (HCC) -Continue metoprolol, continue IV fluids and treatment for sepsis -If needed will adjust beta-blocker dosage -Telemetry monitoring.  6-type II diabetes -Holding oral hypoglycemic agents while inpatient -Continue the use of sliding scale insulin  7-hyponatremia/dehydration -Continue IV fluid -Follow-up lites trend.  8-weakness and fall at home -No acute fracture or obvious injuries appreciated -Physical therapy to assess patient prior to discharge.  DVT prophylaxis: Heparin Code Status: Full code Family Communication: No family at bedside. Disposition:   Status is: Inpatient  Dispo: The patient is from: Home              Anticipated d/c is to: Home with caregivers.              Anticipated d/c date is: 02/11/20              Patient currently no medically stable for discharge; still with active sepsis features present (even improving); cultures are pending at this time.  Continue IV fluids, as needed antipyretics, IV fluids, current IV antibiotics and follow final culture results.     Consultants:   None   Procedures:  See below for x-ray reports.  Antimicrobials:  Cefepime>> Vancomycin  02/08/20>>>>02/09/20  Subjective: Not feeling well, low-grade temperature overnight; denies chest pain, no nausea or vomiting.  Positive tachycardia and 2 out of 2 gram-negative rods blood culture reported.  Objective: Vitals:   02/09/20 0900 02/09/20 0915 02/09/20 0930 02/09/20 0931  BP: (!) 134/47  140/63   Pulse: 88 (!) 120 (!) 114 (!) 115  Resp: 17 17 (!) 32 18  Temp:      TempSrc:      SpO2: (!) 89% 91% 94% 94%  Weight:  Height:        Intake/Output Summary (Last 24 hours) at 02/09/2020 0955 Last data filed at 02/09/2020 1093 Gross per 24 hour  Intake 6940.04 ml  Output 2300 ml  Net 4640.04 ml   Filed Weights   02/08/20 1731  Weight: 66.2 kg    Examination:  General exam: Currently afebrile, no chest pain, no nausea or vomiting.  Expressed feeling  unwell and very weak. Respiratory system: Clear to auscultation; no crackles, no wheezing. Cardiovascular system: Sinus tachycardia, no rubs, no gallops, no JVD on exam. Gastrointestinal system: Abdomen is nondistended, soft and nontender to palpation.  Urostomy bag in place.  Central nervous system: No focal neurological deficits. Extremities: No cyanosis or clubbing. Skin: No petechiae. Psychiatry: Judgement and insight appear impaired by underlying dementia; patient is pleasantly confused.  Slightly anxious on examination.   Data Reviewed: I have personally reviewed following labs and imaging studies  CBC: Recent Labs  Lab 02/08/20 1718 02/09/20 0554  WBC 15.1* 12.3*  NEUTROABS 13.6*  --   HGB 14.0 12.6  HCT 42.1 38.3  MCV 91.1 91.4  PLT 140* 106*    Basic Metabolic Panel: Recent Labs  Lab 02/08/20 1718 02/09/20 0554  NA 131* 135  K 3.5 2.9*  CL 98 104  CO2 23 24  GLUCOSE 253* 181*  BUN 29* 24*  CREATININE 1.08* 0.85  CALCIUM 9.7 9.2    GFR: Estimated Creatinine Clearance: 41.2 mL/min (by C-G formula based on SCr of 0.85 mg/dL).  Liver Function Tests: Recent Labs  Lab 02/08/20 1718  AST 35  ALT 25  ALKPHOS 106  BILITOT 0.7  PROT 7.2  ALBUMIN 3.8    CBG: Recent Labs  Lab 02/08/20 2126 02/09/20 0746  GLUCAP 205* 171*     Recent Results (from the past 240 hour(s))  Resp Panel by RT-PCR (Flu A&B, Covid) Nasopharyngeal Swab     Status: None   Collection Time: 02/08/20  5:24 PM   Specimen: Nasopharyngeal Swab; Nasopharyngeal(NP) swabs in vial transport medium  Result Value Ref Range Status   SARS Coronavirus 2 by RT PCR NEGATIVE NEGATIVE Final    Comment: (NOTE) SARS-CoV-2 target nucleic acids are NOT DETECTED.  The SARS-CoV-2 RNA is generally detectable in upper respiratory specimens during the acute phase of infection. The lowest concentration of SARS-CoV-2 viral copies this assay can detect is 138 copies/mL. A negative result does not  preclude SARS-Cov-2 infection and should not be used as the sole basis for treatment or other patient management decisions. A negative result may occur with  improper specimen collection/handling, submission of specimen other than nasopharyngeal swab, presence of viral mutation(s) within the areas targeted by this assay, and inadequate number of viral copies(<138 copies/mL). A negative result must be combined with clinical observations, patient history, and epidemiological information. The expected result is Negative.  Fact Sheet for Patients:  EntrepreneurPulse.com.au  Fact Sheet for Healthcare Providers:  IncredibleEmployment.be  This test is no t yet approved or cleared by the [place] FDA and  has been authorized for detection and/or diagnosis of SARS-CoV-2 by FDA under an Emergency Use Authorization (EUA). This EUA will remain  in effect (meaning this test can be used) for the duration of the COVID-19 declaration under Section 564(b)(1) of the Act, 21 U.S.C.section 360bbb-3(b)(1), unless the authorization is terminated  or revoked sooner.       Influenza A by PCR NEGATIVE NEGATIVE Final   Influenza B by PCR NEGATIVE NEGATIVE Final    Comment: (NOTE)  The Xpert Xpress SARS-CoV-2/FLU/RSV plus assay is intended as an aid in the diagnosis of influenza from Nasopharyngeal swab specimens and should not be used as a sole basis for treatment. Nasal washings and aspirates are unacceptable for Xpert Xpress SARS-CoV-2/FLU/RSV testing.  Fact Sheet for Patients: EntrepreneurPulse.com.au  Fact Sheet for Healthcare Providers: IncredibleEmployment.be  This test is not yet approved or cleared by the [place] FDA and has been authorized for detection and/or diagnosis of SARS-CoV-2 by FDA under an Emergency Use Authorization (EUA). This EUA will remain in effect (meaning this test can be used) for the  duration of the COVID-19 declaration under Section 564(b)(1) of the Act, 21 U.S.C. section 360bbb-3(b)(1), unless the authorization is terminated or revoked.  Performed at [facility], [address], [place], [place] [address]   Blood Culture (routine x 2)     Status: None (Preliminary result)   Collection Time: 02/08/20  5:35 PM   Specimen: BLOOD  Result Value Ref Range Status   Specimen Description BLOOD RIGHT ANTECUBITAL  Final   Special Requests   Final    BOTTLES DRAWN AEROBIC AND ANAEROBIC Blood Culture adequate volume   Culture  Setup Time   Final    GRAM NEGATIVE RODS IN BOTH AEROBIC AND ANAEROBIC BOTTLES Gram Stain Report Called to,Read Back By and Verified With: CRAWFORD H. @ 0349 ON [id number] BY HENDERSONL.    Culture   Final    NO GROWTH < 24 HOURS Performed at [facility], [address], [place], [place] [address]    Report Status PENDING  Incomplete  Blood Culture (routine x 2)     Status: None (Preliminary result)   Collection Time: 02/08/20  5:36 PM   Specimen: BLOOD RIGHT ARM  Result Value Ref Range Status   Specimen Description BLOOD RIGHT ARM  Final   Special Requests   Final    BOTTLES DRAWN AEROBIC ONLY Blood Culture adequate volume   Culture  Setup Time   Final    GRAM NEGATIVE RODS AEROBIC BOTTLE ONLY Gram Stain Report Called to,Read Back By and Verified With: CRAWFORD @ 4801 KP [id number] BY HENDERSON L.    Culture   Final    NO GROWTH < 24 HOURS Performed at [facility], [address] [place], [place] [address]    Report Status PENDING  Incomplete     Radiology Studies: DG Chest Port 1 View  Result Date: 02/08/2020 CLINICAL DATA:  Possible sepsis found on floor EXAM: PORTABLE CHEST 1 VIEW COMPARISON:  Report 05/25/2012 FINDINGS: No consolidation or effusion. Mild cardiomegaly with aortic atherosclerosis. Diffuse coarse interstitial opacity likely chronic bronchitic change. Ovoid opacity in the right hilus likely due to hilar vessel. No pneumothorax  IMPRESSION: No active cardiopulmonary disease. Mild cardiomegaly with probable chronic bronchitic changes. Ovoid opacity in the right hilus suspected to be secondary to hilar vessel. Electronically Signed   By: Donavan Foil M.D.   On: 02/08/2020 18:00   Scheduled Meds: . ascorbic acid  500 mg Oral Daily  . aspirin EC  81 mg Oral Daily  . cholecalciferol  2,000 Units Oral Daily  . heparin  5,000 Units Subcutaneous Q8H  . insulin aspart  0-5 Units Subcutaneous QHS  . insulin aspart  0-6 Units Subcutaneous TID WC  . loratadine  5 mg Oral Daily  . LORazepam  0.5 mg Intravenous Once  . memantine  5 mg Oral Daily  . metoprolol succinate  50 mg Oral Daily  . polyethylene glycol  17 g  Oral Daily  . sodium chloride flush  3 mL Intravenous Q12H  . sodium chloride flush  3 mL Intravenous Q12H  . traZODone  50 mg Oral QHS   Continuous Infusions: . sodium chloride    . ceFEPime (MAXIPIME) IV Stopped (02/09/20 2774)  . lactated ringers Stopped (02/09/20 0542)     LOS: 1 day    Time spent: 15 minutes    Barton Dubois, MD [facility]   To contact the attending provider between 7A-7P or the covering provider during after hours 7P-7A, please log into the web site [email or web address] and access using universal [place] password for that web site. If you do not have the password, please call the hospital operator.  02/09/2020, 9:55 AM

## 2020-02-10 DIAGNOSIS — L8990 Pressure ulcer of unspecified site, unspecified stage: Secondary | ICD-10-CM | POA: Insufficient documentation

## 2020-02-10 DIAGNOSIS — A419 Sepsis, unspecified organism: Secondary | ICD-10-CM | POA: Diagnosis not present

## 2020-02-10 DIAGNOSIS — G301 Alzheimer's disease with late onset: Secondary | ICD-10-CM | POA: Diagnosis not present

## 2020-02-10 DIAGNOSIS — I471 Supraventricular tachycardia: Secondary | ICD-10-CM | POA: Diagnosis not present

## 2020-02-10 DIAGNOSIS — N390 Urinary tract infection, site not specified: Secondary | ICD-10-CM | POA: Diagnosis not present

## 2020-02-10 LAB — GLUCOSE, CAPILLARY
Glucose-Capillary: 121 mg/dL — ABNORMAL HIGH (ref 70–99)
Glucose-Capillary: 96 mg/dL (ref 70–99)
Glucose-Capillary: 93 mg/dL (ref 70–99)

## 2020-02-10 LAB — BASIC METABOLIC PANEL
Anion gap: 9 (ref 5–15)
BUN: 20 mg/dL (ref 8–23)
CO2: 24 mmol/L (ref 22–32)
Calcium: 9.3 mg/dL (ref 8.9–10.3)
Chloride: 103 mmol/L (ref 98–111)
Creatinine, Ser: 0.67 mg/dL (ref 0.44–1.00)
GFR, Estimated: >60 mL/min (ref >60)
Glucose, Bld: 133 mg/dL — ABNORMAL HIGH (ref 70–99)
Potassium: 3.3 mmol/L — ABNORMAL LOW (ref 3.5–5.1)
Sodium: 136 mmol/L (ref 135–145)

## 2020-02-10 LAB — CBC
HCT: 36.3 % (ref 36.0–46.0)
Hemoglobin: 12.1 g/dL (ref 12.0–15.0)
MCH: 30.4 pg (ref 26.0–34.0)
MCHC: 33.3 g/dL (ref 30.0–36.0)
MCV: 91.2 fL (ref 80.0–100.0)
Platelets: 116 10*3/uL — ABNORMAL LOW (ref 150–400)
RBC: 3.98 MIL/uL (ref 3.87–5.11)
RDW: 13.2 % (ref 11.5–15.5)
WBC: 7.3 10*3/uL (ref 4.0–10.5)
nRBC: 0 % (ref 0.0–0.2)

## 2020-02-10 LAB — URINE CULTURE

## 2020-02-10 MED ORDER — SODIUM CHLORIDE 0.9 % IV SOLN
1.0000 g | Freq: Two times a day (BID) | INTRAVENOUS | Status: DC
  Administered 2020-02-10 (×2): 1 g via INTRAVENOUS
  Filled 2020-02-10 (×2): qty 1

## 2020-02-10 MED ORDER — POTASSIUM CHLORIDE CRYS ER 20 MEQ PO TBCR
40.0000 meq | EXTENDED_RELEASE_TABLET | Freq: Once | ORAL | Status: AC
  Administered 2020-02-11: 40 meq via ORAL
  Filled 2020-02-10: qty 2

## 2020-02-10 MED ORDER — LORAZEPAM 2 MG/ML IJ SOLN
0.2500 mg | Freq: Three times a day (TID) | INTRAMUSCULAR | Status: DC | PRN
  Administered 2020-02-11 – 2020-02-12 (×2): 0.25 mg via INTRAVENOUS
  Filled 2020-02-10 (×2): qty 1

## 2020-02-10 NOTE — Progress Notes (Signed)
PROGRESS NOTE    Theresa Hunt  LEX:[id number] DOB: 1929-11-08 DOA: 02/08/2020 PCP: [name], MD   Chief Complaint  Patient presents with  . Weakness    Brief Narrative:  As per H&P written by Dr. Denton Brick on 02/08/2020 Theresa Hunt  is a 86 y.o. female with past medical history relevant for of dementia, Interstitial cystitis/ s/p Prior Cystectomy with Uretero-ileaconduity, HLD, HTN, history of paroxysmal SVT, and diabetes mellitus presenting to the ED via EMS with fevers up to 102.3 after being found on the floor home -- History is limited due to underlying dementia -It is unclear how long patient has been on the floor, patient was somewhat disoriented and confused, however no obvious injuries noted -Patient repeatedly denies any pain -Work-up in the ED reveals chest x-ray showed without acute findings -Troponin of 21 -Initial lactic acid was 1.8, repeat lactic acid was 2.8--patient received IV fluid boluses -Total CK only 29 -Chemistry with a sodium of 131 glucose is 253 creatinine is 1.08 with a BUN of 29, LFTs are not elevated -Remarkable for white count of 15.1 and platelets of 140 hemoglobin is 14 -From urostomy bag suggestive of UTI--- -EDP obtain urine culture and blood cultures and give IV cefepime vancomycin and Flagyl.  Assessment & Plan: 1-sepsis [facility]): Present on admission -In the setting of UTI and bacteremia -Continue IV fluids, IV antibiotics, antipyretics and as needed antiemetics. -Will continue supportive care and follow clinical response -Continue to follow culture results.  2-Interstitial cystitis/ s/p Prior Cystectomy with Uretero-ileaconduit -Urostomy bag in place -No signs of hematuria -Continue treatment for UTI as mentioned above.  3-Dementia ([place]) -Continue supportive care and constant reorientation -Resume the use of Namenda -As needed benzodiazepines dose has been adjusted to minimize overt sedation.  4-HTN (hypertension) -Continue  metoprolol  5-H/o Paroxysmal SVT (supraventricular tachycardia) (HCC) -Continue metoprolol, continue IV fluids and treatment for sepsis -If needed will adjust beta-blocker dosage -Telemetry monitoring.  6-type II diabetes -Holding oral hypoglycemic agents while inpatient -Continue the use of sliding scale insulin  7-hyponatremia/hypokalemia/dehydration -Continue IV fluid, will adjust rate -Follow-up electrolites trend. -sodium is 136 and potassium 3.3 today  8-weakness and fall at home -No acute fracture or obvious injuries appreciated -Physical therapy to assess patient prior to discharge.  DVT prophylaxis: Heparin Code Status: Full code Family Communication: Son at bedside. Disposition:   Status is: Inpatient  Dispo: The patient is from: Home              Anticipated d/c is to: Home with caregivers.              Anticipated d/c date is: 02/11/20              Patient currently no medically stable for discharge; still receiving IV antibiotics; pending cultures results.  No fever.  Continue IV fluids, ask for physical therapy assessment to evaluate capacity for performing her activities prior to discharge.  Patient is very weak and deconditioned and after discussing with the son they would like for her to receive some rehabilitation before returning home.       Consultants:   None   Procedures:  See below for x-ray reports.  Antimicrobials:  Cefepime>> Vancomycin  02/08/20>>>>02/09/20  Subjective: Lethargic this morning; no chest pain, no nausea, no vomiting.  Very restless night per nursing report requiring to receive benzodiazepine.  Patient is afebrile.  Very weak and deconditioned.  Objective: Vitals:   02/09/20 2036 02/10/20 0003 02/10/20 0150 02/10/20 0405  BP: 138/65 Marland Kitchen)  137/91  (!) 143/70  Pulse: 92 98  95  Resp: 20 20  18   Temp: 99.6 F (37.6 C) 99.6 F (37.6 C)  98.9 F (37.2 C)  TempSrc:      SpO2: 96% 95%  100%  Weight:   66.2 kg   Height:          Intake/Output Summary (Last 24 hours) at 02/10/2020 1212 Last data filed at 02/10/2020 1033 Gross per 24 hour  Intake 1030.53 ml  Output 3100 ml  Net -2069.47 ml   Filed Weights   02/08/20 1731 02/10/20 0150  Weight: 66.2 kg 66.2 kg    Examination: General exam: Lethargic this morning; per nursing report very restless night requiring benzodiazepine.  No chest pain, no nausea, no vomiting.  Afebrile and good urine output reported. Respiratory system: Clear to auscultation. Respiratory effort normal.  No using accessory muscles. Cardiovascular system:RRR.  No JVD; no rubs or gallops appreciated.   Gastrointestinal system: Abdomen is nondistended, soft and nontender.  Urostomy bag in place.  Positive bowel sounds. Central nervous system: No focal neurological deficits.  Generally weak and deconditioned. Extremities: No cyanosis or clubbing. Skin: No petechiae. Psychiatry: Judgment and insight impaired due to underlying dementia.  Mood & affect appropriate currently.     Data Reviewed: I have personally reviewed following labs and imaging studies  CBC: Recent Labs  Lab 02/08/20 1718 02/09/20 0554 02/10/20 0710  WBC 15.1* 12.3* 7.3  NEUTROABS 13.6*  --   --   HGB 14.0 12.6 12.1  HCT 42.1 38.3 36.3  MCV 91.1 91.4 91.2  PLT 140* 106* 116*    Basic Metabolic Panel: Recent Labs  Lab 02/08/20 1718 02/09/20 0554 02/10/20 0710  NA 131* 135 136  K 3.5 2.9* 3.3*  CL 98 104 103  CO2 23 24 24   GLUCOSE 253* 181* 133*  BUN 29* 24* 20  CREATININE 1.08* 0.85 0.67  CALCIUM 9.7 9.2 9.3    GFR: Estimated Creatinine Clearance: 43.7 mL/min (by C-G formula based on SCr of 0.67 mg/dL).  Liver Function Tests: Recent Labs  Lab 02/08/20 1718  AST 35  ALT 25  ALKPHOS 106  BILITOT 0.7  PROT 7.2  ALBUMIN 3.8    CBG: Recent Labs  Lab 02/09/20 1211 02/09/20 1618 02/09/20 1647 02/09/20 2114 02/10/20 0752  GLUCAP 214* 176* 181* 108* 121*     Recent Results (from  the past 240 hour(s))  Urine culture     Status: Abnormal   Collection Time: 02/08/20  5:22 PM   Specimen: In/Out Cath Urine  Result Value Ref Range Status   Specimen Description   Final    IN/OUT CATH URINE Performed at [facility], [address], [place], [place] [address]    Special Requests   Final    NONE Performed at [facility], [address], [place],  [address]    [place], SUGGEST RECOLLECTION (A)  Final   Report Status 02/10/2020 FINAL  Final  Resp Panel by RT-PCR (Flu A&B, Covid) Nasopharyngeal Swab     Status: None   Collection Time: 02/08/20  5:24 PM   Specimen: Nasopharyngeal Swab; Nasopharyngeal(NP) swabs in vial transport medium  Result Value Ref Range Status   SARS Coronavirus 2 by RT PCR NEGATIVE NEGATIVE Final    Comment: (NOTE) SARS-CoV-2 target nucleic acids are NOT DETECTED.  The SARS-CoV-2 RNA is generally detectable in upper respiratory specimens during the acute phase of infection. The lowest concentration of SARS-CoV-2 viral copies  this assay can detect is 138 copies/mL. A negative result does not preclude SARS-Cov-2 infection and should not be used as the sole basis for treatment or other patient management decisions. A negative result may occur with  improper specimen collection/handling, submission of specimen other than nasopharyngeal swab, presence of viral mutation(s) within the areas targeted by this assay, and inadequate number of viral copies(<138 copies/mL). A negative result must be combined with clinical observations, patient history, and epidemiological information. The expected result is Negative.  Fact Sheet for Patients:  EntrepreneurPulse.com.au  Fact Sheet for Healthcare Providers:  IncredibleEmployment.be  This test is no t yet approved or cleared by the [place] FDA and  has been authorized for detection and/or diagnosis of SARS-CoV-2 by FDA under an  Emergency Use Authorization (EUA). This EUA will remain  in effect (meaning this test can be used) for the duration of the COVID-19 declaration under Section 564(b)(1) of the Act, 21 U.S.C.section 360bbb-3(b)(1), unless the authorization is terminated  or revoked sooner.       Influenza A by PCR NEGATIVE NEGATIVE Final   Influenza B by PCR NEGATIVE NEGATIVE Final    Comment: (NOTE) The Xpert Xpress SARS-CoV-2/FLU/RSV plus assay is intended as an aid in the diagnosis of influenza from Nasopharyngeal swab specimens and should not be used as a sole basis for treatment. Nasal washings and aspirates are unacceptable for Xpert Xpress SARS-CoV-2/FLU/RSV testing.  Fact Sheet for Patients: EntrepreneurPulse.com.au  Fact Sheet for Healthcare Providers: IncredibleEmployment.be  This test is not yet approved or cleared by the [place] FDA and has been authorized for detection and/or diagnosis of SARS-CoV-2 by FDA under an Emergency Use Authorization (EUA). This EUA will remain in effect (meaning this test can be used) for the duration of the COVID-19 declaration under Section 564(b)(1) of the Act, 21 U.S.C. section 360bbb-3(b)(1), unless the authorization is terminated or revoked.  Performed at [facility], [address], [place], [place] [address]   Blood Culture (routine x 2)     Status: Abnormal (Preliminary result)   Collection Time: 02/08/20  5:35 PM   Specimen: BLOOD  Result Value Ref Range Status   Specimen Description   Final    BLOOD RIGHT ANTECUBITAL Performed at [facility], [address], [place], [place] [address]    Special Requests   Final    BOTTLES DRAWN AEROBIC AND ANAEROBIC Blood Culture adequate volume Performed at [facility], [address] [place], [place] [address]    Culture  Setup Time   Final    GRAM NEGATIVE RODS IN BOTH AEROBIC AND ANAEROBIC BOTTLES Gram Stain Report Called to,Read Back By and Verified With:  CRAWFORD H. @ 0350 ON [id number] BY HENDERSONL. CRITICAL RESULT CALLED TO, READ BACK BY AND VERIFIED WITH: RN M DOSS [id number] MLM    Culture (A)  Final    ESCHERICHIA COLI SUSCEPTIBILITIES TO FOLLOW Performed at [facility], [address] [place], [place] [address]    Report Status PENDING  Incomplete  Blood Culture ID Panel (Reflexed)     Status: Abnormal   Collection Time: 02/08/20  5:35 PM  Result Value Ref Range Status   Enterococcus faecalis NOT DETECTED NOT DETECTED Final   Enterococcus Faecium NOT DETECTED NOT DETECTED Final   Listeria monocytogenes NOT DETECTED NOT DETECTED Final   Staphylococcus species NOT DETECTED NOT DETECTED Final   Staphylococcus aureus (BCID) NOT DETECTED NOT DETECTED Final   Staphylococcus epidermidis NOT DETECTED NOT DETECTED Final   Staphylococcus  lugdunensis NOT DETECTED NOT DETECTED Final   Streptococcus species NOT DETECTED NOT DETECTED Final   Streptococcus agalactiae NOT DETECTED NOT DETECTED Final   Streptococcus pneumoniae NOT DETECTED NOT DETECTED Final   Streptococcus pyogenes NOT DETECTED NOT DETECTED Final   A.calcoaceticus-baumannii NOT DETECTED NOT DETECTED Final   Bacteroides fragilis NOT DETECTED NOT DETECTED Final   Enterobacterales DETECTED (A) NOT DETECTED Final    Comment: Enterobacterales represent a large order of gram negative bacteria, not a single organism. CRITICAL RESULT CALLED TO, READ BACK BY AND VERIFIED WITH: RN M DOSS [id number] MLM    Enterobacter cloacae complex NOT DETECTED NOT DETECTED Final   Escherichia coli DETECTED (A) NOT DETECTED Final    Comment: CRITICAL RESULT CALLED TO, READ BACK BY AND VERIFIED WITH: RN M DOSS [id number] MLM    Klebsiella aerogenes NOT DETECTED NOT DETECTED Final   Klebsiella oxytoca NOT DETECTED NOT DETECTED Final   Klebsiella pneumoniae NOT DETECTED NOT DETECTED Final   Proteus species NOT DETECTED NOT DETECTED Final   Salmonella species NOT DETECTED NOT DETECTED Final     Serratia marcescens NOT DETECTED NOT DETECTED Final   Haemophilus influenzae NOT DETECTED NOT DETECTED Final   Neisseria meningitidis NOT DETECTED NOT DETECTED Final   Pseudomonas aeruginosa NOT DETECTED NOT DETECTED Final   Stenotrophomonas maltophilia NOT DETECTED NOT DETECTED Final   Candida albicans NOT DETECTED NOT DETECTED Final   Candida auris NOT DETECTED NOT DETECTED Final   Candida glabrata NOT DETECTED NOT DETECTED Final   Candida krusei NOT DETECTED NOT DETECTED Final   Candida parapsilosis NOT DETECTED NOT DETECTED Final   Candida tropicalis NOT DETECTED NOT DETECTED Final   Cryptococcus neoformans/gattii NOT DETECTED NOT DETECTED Final   CTX-M ESBL NOT DETECTED NOT DETECTED Final   Carbapenem resistance IMP NOT DETECTED NOT DETECTED Final   Carbapenem resistance KPC NOT DETECTED NOT DETECTED Final   Carbapenem resistance NDM NOT DETECTED NOT DETECTED Final   Carbapenem resist OXA 48 LIKE NOT DETECTED NOT DETECTED Final   Carbapenem resistance VIM NOT DETECTED NOT DETECTED Final    Comment: Performed at [facility], [address] [place], [place] [address]  Blood Culture (routine x 2)     Status: Abnormal (Preliminary result)   Collection Time: 02/08/20  5:36 PM   Specimen: BLOOD RIGHT ARM  Result Value Ref Range Status   Specimen Description   Final    BLOOD RIGHT ARM Performed at [facility], [address], [place], [place] [address]    Special Requests   Final    BOTTLES DRAWN AEROBIC ONLY Blood Culture adequate volume Performed at [facility], [address] [place], [place] [address]    Culture  Setup Time   Final    GRAM NEGATIVE RODS AEROBIC BOTTLE ONLY Gram Stain Report Called to,Read Back By and Verified With: CRAWFORD @ 9417 ON [id number] BY HENDERSON L. CRITICAL VALUE NOTED.  VALUE IS CONSISTENT WITH PREVIOUSLY REPORTED AND CALLED VALUE. Performed at [facility], [place] [address], [place], [place] [address]    Culture ESCHERICHIA COLI (A)   Final   Report Status PENDING  Incomplete     Radiology Studies: DG Chest Port 1 View  Result Date: 02/08/2020 CLINICAL DATA:  Possible sepsis found on floor EXAM: PORTABLE CHEST 1 VIEW COMPARISON:  Report 05/25/2012 FINDINGS: No consolidation or effusion. Mild cardiomegaly with aortic atherosclerosis. Diffuse coarse interstitial opacity likely chronic bronchitic change. Ovoid opacity in the right hilus likely due  to hilar vessel. No pneumothorax IMPRESSION: No active cardiopulmonary disease. Mild cardiomegaly with probable chronic bronchitic changes. Ovoid opacity in the right hilus suspected to be secondary to hilar vessel. Electronically Signed   By: Donavan Foil M.D.   On: 02/08/2020 18:00   Scheduled Meds: . ascorbic acid  500 mg Oral Daily  . aspirin EC  81 mg Oral Daily  . cholecalciferol  2,000 Units Oral Daily  . heparin  5,000 Units Subcutaneous Q8H  . insulin aspart  0-5 Units Subcutaneous QHS  . insulin aspart  0-6 Units Subcutaneous TID WC  . loratadine  5 mg Oral Daily  . memantine  5 mg Oral Daily  . metoprolol succinate  50 mg Oral BID  . polyethylene glycol  17 g Oral Daily  . sodium chloride flush  3 mL Intravenous Q12H  . sodium chloride flush  3 mL Intravenous Q12H  . traZODone  50 mg Oral QHS   Continuous Infusions: . sodium chloride    . ceFEPime (MAXIPIME) IV 2 g (02/10/20 1033)  . lactated ringers 125 mL/hr at 02/09/20 2051     LOS: 2 days    Time spent: 30 minutes    Barton Dubois, MD [facility]   To contact the attending provider between 7A-7P or the covering provider during after hours 7P-7A, please log into the web site [email or web address] and access using universal [place] password for that web site. If you do not have the password, please call the hospital operator.  02/10/2020, 12:12 PM

## 2020-02-11 DIAGNOSIS — I471 Supraventricular tachycardia: Secondary | ICD-10-CM | POA: Diagnosis not present

## 2020-02-11 DIAGNOSIS — G301 Alzheimer's disease with late onset: Secondary | ICD-10-CM | POA: Diagnosis not present

## 2020-02-11 DIAGNOSIS — A419 Sepsis, unspecified organism: Secondary | ICD-10-CM | POA: Diagnosis not present

## 2020-02-11 DIAGNOSIS — N390 Urinary tract infection, site not specified: Secondary | ICD-10-CM | POA: Diagnosis not present

## 2020-02-11 LAB — BASIC METABOLIC PANEL
Anion gap: 9 (ref 5–15)
BUN: 20 mg/dL (ref 8–23)
CO2: 25 mmol/L (ref 22–32)
Calcium: 9 mg/dL (ref 8.9–10.3)
Chloride: 103 mmol/L (ref 98–111)
Creatinine, Ser: 0.72 mg/dL (ref 0.44–1.00)
GFR, Estimated: >60 mL/min (ref >60)
Glucose, Bld: 85 mg/dL (ref 70–99)
Potassium: 2.8 mmol/L — ABNORMAL LOW (ref 3.5–5.1)
Sodium: 137 mmol/L (ref 135–145)

## 2020-02-11 LAB — GLUCOSE, CAPILLARY
Glucose-Capillary: 88 mg/dL (ref 70–99)
Glucose-Capillary: 75 mg/dL (ref 70–99)
Glucose-Capillary: 112 mg/dL — ABNORMAL HIGH (ref 70–99)
Glucose-Capillary: 193 mg/dL — ABNORMAL HIGH (ref 70–99)
Glucose-Capillary: 142 mg/dL — ABNORMAL HIGH (ref 70–99)

## 2020-02-11 LAB — CULTURE, BLOOD (ROUTINE X 2)
Special Requests: ADEQUATE
Special Requests: ADEQUATE

## 2020-02-11 LAB — MAGNESIUM: Magnesium: 1.7 mg/dL (ref 1.7–2.4)

## 2020-02-11 MED ORDER — METOPROLOL SUCCINATE ER 50 MG PO TB24: 75.0000 mg | ORAL_TABLET | Freq: Two times a day (BID) | ORAL | Status: DC

## 2020-02-11 MED ORDER — METOPROLOL TARTRATE 5 MG/5ML IV SOLN
2.5000 mg | Freq: Once | INTRAVENOUS | Status: AC
  Administered 2020-02-11: 2.5 mg via INTRAVENOUS

## 2020-02-11 MED ORDER — METOPROLOL SUCCINATE ER 50 MG PO TB24
75.0000 mg | ORAL_TABLET | Freq: Three times a day (TID) | ORAL | Status: DC
  Administered 2020-02-11 – 2020-02-15 (×11): 75 mg via ORAL
  Filled 2020-02-11 (×11): qty 1

## 2020-02-11 MED ORDER — SODIUM CHLORIDE 0.9 % IV SOLN
2.0000 g | INTRAVENOUS | Status: DC
  Administered 2020-02-11 – 2020-02-12 (×2): 2 g via INTRAVENOUS
  Filled 2020-02-11 (×2): qty 20

## 2020-02-11 MED ORDER — POTASSIUM CHLORIDE CRYS ER 20 MEQ PO TBCR
40.0000 meq | EXTENDED_RELEASE_TABLET | ORAL | Status: AC
  Administered 2020-02-11 (×3): 40 meq via ORAL
  Filled 2020-02-11 (×3): qty 2

## 2020-02-11 MED ORDER — METOPROLOL TARTRATE 5 MG/5ML IV SOLN
2.5000 mg | Freq: Once | INTRAVENOUS | Status: AC
  Administered 2020-02-11: 2.5 mg via INTRAVENOUS
  Filled 2020-02-11: qty 5

## 2020-02-11 NOTE — TOC Initial Note (Addendum)
Transition of Care [facility]) - Initial/Assessment Note    Patient Details  Name: Theresa Hunt MRN: [id number] Date of Birth: August 16, 1929  [facility] CM/SW Contact:    Boneta Lucks, RN Phone Number: 02/11/2020, 1:38 PM  Clinical Narrative:   Patient admitted with sepsis. Patient has urostomy and UTI. PT is recommending SNF. TOC spoke with Anthony(son) and Hurshel Keys ( daughter), first choice [facility]. FL2 completed and sent out. PASSR is pending, faxing records to [facility] for review.               Addendum: Family accepted bed offer at [facility], INS auth Started. Son asking about medicaid application. Referred to Tito Dine to assist son with questions. Initial assessment  with Tito Dine, patient is over resources. She will get more information and get back in touch with the son.   AddendumAnnia Friendly received   # [phone] E  Expected Discharge Plan: Skilled Nursing Facility Barriers to Discharge: Continued Medical Work up   Patient Goals and CMS Choice Patient states their goals for this hospitalization and ongoing recovery are:: to go to SNF for rehab. CMS Medicare.gov Compare Post Acute Care list provided to:: Patient Represenative (must comment) Choice offered to / list presented to : Adult Children  Expected Discharge Plan and Services Expected Discharge Plan: [place]     Prior Living Arrangements/Services   Lives with:: Self Patient language and need for interpreter reviewed:: Yes        Need for Family Participation in Patient Care: Yes (Comment) Care giver support system in place?: Yes (comment)   Criminal Activity/Legal Involvement Pertinent to Current Situation/Hospitalization: No - Comment as needed  Activities of Daily Living Home Assistive Devices/Equipment: None ADL Screening (condition at time of admission) Patient's cognitive ability adequate to safely complete daily activities?: No Is the patient deaf or have difficulty hearing?: No Does the patient  have difficulty seeing, even when wearing glasses/contacts?: No Does the patient have difficulty concentrating, remembering, or making decisions?: Yes Patient able to express need for assistance with ADLs?: Yes Does the patient have difficulty dressing or bathing?: Yes Independently performs ADLs?: No Communication: Independent Dressing (OT): Needs assistance Is this a change from baseline?: Change from baseline, expected to last >3 days Grooming: Needs assistance Is this a change from baseline?: Change from baseline, expected to last >3 days Feeding: Independent Bathing: Needs assistance Is this a change from baseline?: Change from baseline, expected to last >3 days Toileting: Needs assistance Is this a change from baseline?: Change from baseline, expected to last >3days In/Out Bed: Needs assistance Is this a change from baseline?: Change from baseline, expected to last >3 days Walks in Home: Needs assistance Is this a change from baseline?: Change from baseline, expected to last >3 days Does the patient have difficulty walking or climbing stairs?: Yes Weakness of Legs: Both Weakness of Arms/Hands: None  Permission Sought/Granted     Share Information with NAME: [name]     Permission granted to share info w Relationship: Son  Emotional Assessment    Orientation: : Oriented to Self Alcohol / Substance Use: Not Applicable Psych Involvement: No (comment)  Admission diagnosis:  Cystitis [N30.90] Sepsis ([place]) [A41.9] Sepsis, due to unspecified organism, unspecified whether acute organ dysfunction present [facility]) [A41.9] Patient Active Problem List   Diagnosis Date Noted  . Pressure injury of skin 02/10/2020  . Sepsis ([place]) 02/08/2020  . HTN (hypertension) 02/08/2020  . H/o Paroxysmal SVT (supraventricular tachycardia) ([place]) 02/08/2020  . Pressure injury of  left buttock, stage 1 11/01/2019  . 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 Nephrolithiasis  04/06/2019  . History of removal of part of urinary tract 04/06/2019  . History of urinary tract infection 04/06/2019  . UTI (urinary tract infection) 10/09/2018  . Interstitial cystitis/ s/p Prior Cystectomy with Uretero-ileaconduit   . Dementia ([place]) 04/11/2015  . Essential hypertension with goal blood pressure less than 130/80 04/23/2014  . Memory loss 04/23/2014  . Hyperlipidemia 10/22/2013  . SVT (supraventricular tachycardia) ([place]) 09/06/2012   PCP:  [name], MD Pharmacy:   [facility], [place] [address] Phone: [phone] Fax: [phone]

## 2020-02-11 NOTE — Plan of Care (Signed)

## 2020-02-11 NOTE — NC FL2 (Signed)
[place] LEVEL OF CARE SCREENING TOOL     IDENTIFICATION  Patient Name: Theresa Hunt Birthdate: 1929-07-02 Sex: female Admission Date (Current Location): 02/08/2020  [facility] and [place] Number:  [employer] and Address:  [place] [address], [place]      Provider Number: [id number]  Attending Physician Name and Address:  Barton Dubois, MD  Relative Name and Phone Number:  Robb Matar [phone]    Current Level of Care: Hospital Recommended Level of Care: [place] Prior Approval Number:    Date Approved/Denied:   PASRR Number: Pending  Discharge Plan: SNF    Current Diagnoses: Patient Active Problem List   Diagnosis Date Noted  . Pressure injury of skin 02/10/2020  . Sepsis ([place]) 02/08/2020  . HTN (hypertension) 02/08/2020  . H/o Paroxysmal SVT (supraventricular tachycardia) ([place]) 02/08/2020  . Pressure injury of left buttock, stage 1 11/01/2019  . 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 Nephrolithiasis 04/06/2019  . History of removal of part of urinary tract 04/06/2019  . History of urinary tract infection 04/06/2019  . UTI (urinary tract infection) 10/09/2018  . Interstitial cystitis/ s/p Prior Cystectomy with Uretero-ileaconduit   . Dementia ([place]) 04/11/2015  . Essential hypertension with goal blood pressure less than 130/80 04/23/2014  . Memory loss 04/23/2014  . Hyperlipidemia 10/22/2013  . SVT (supraventricular tachycardia) ([place]) 09/06/2012    Orientation RESPIRATION BLADDER Height & Weight     Self  Normal Urostomy (RLQ) Weight: 67.9 kg Height:  5\' 5"  (165.1 cm)  BEHAVIORAL SYMPTOMS/MOOD NEUROLOGICAL BOWEL NUTRITION STATUS      Continent Diet (See DC summary)  AMBULATORY STATUS COMMUNICATION OF NEEDS Skin   Extensive Assist Verbally Skin abrasions (Buttock)                       Personal Care Assistance Level of Assistance  Bathing,  Dressing, Feeding Bathing Assistance: Maximum assistance Feeding assistance: Limited assistance Dressing Assistance: Maximum assistance     Functional Limitations Info  Sight, Hearing, Speech Sight Info: Adequate Hearing Info: Adequate Speech Info: Adequate    SPECIAL CARE FACTORS FREQUENCY  PT (By licensed PT)     PT Frequency: 5 times a week              Contractures Contractures Info: Not present    Additional Factors Info  Code Status, Allergies Code Status Info: FULL Allergies Info: codeine, donepezil           Current Medications (02/11/2020):  This is the current hospital active medication list Current Facility-Administered Medications  Medication Dose Route Frequency Provider Last Rate Last Admin  . 0.9 %  sodium chloride infusion  250 mL Intravenous PRN Emokpae, Courage, MD      . acetaminophen (TYLENOL) tablet 650 mg  650 mg Oral Q6H PRN Emokpae, Courage, MD       Or  . acetaminophen (TYLENOL) suppository 650 mg  650 mg Rectal Q6H PRN Emokpae, Courage, MD      . ascorbic acid (VITAMIN C) tablet 500 mg  500 mg Oral Daily Emokpae, Courage, MD   500 mg at 02/11/20 0936  . aspirin EC tablet 81 mg  81 mg Oral Daily Denton Brick, Courage, MD   81 mg at 02/11/20 0937  . bisacodyl (DULCOLAX) suppository 10 mg  10 mg Rectal Daily PRN Emokpae, Courage, MD      . cefTRIAXone (ROCEPHIN) 2 g in sodium chloride 0.9 %  100 mL IVPB  2 g Intravenous Q24H Barton Dubois, MD 200 mL/hr at 02/11/20 1115 2 g at 02/11/20 1115  . cholecalciferol (VITAMIN D3) tablet 2,000 Units  2,000 Units Oral Daily Denton Brick, Courage, MD   2,000 Units at 02/11/20 0935  . heparin injection 5,000 Units  5,000 Units Subcutaneous Q8H Roxan Hockey, MD   5,000 Units at 02/11/20 0541  . insulin aspart (novoLOG) injection 0-5 Units  0-5 Units Subcutaneous QHS Roxan Hockey, MD   2 Units at 02/08/20 2132  . insulin aspart (novoLOG) injection 0-6 Units  0-6 Units Subcutaneous TID WC Roxan Hockey, MD   1  Units at 02/09/20 1709  . lactated ringers infusion   Intravenous Continuous Barton Dubois, MD   Stopped at 02/10/20 1427  . loratadine (CLARITIN) tablet 5 mg  5 mg Oral Daily Emokpae, Courage, MD   5 mg at 02/11/20 0937  . LORazepam (ATIVAN) injection 0.25 mg  0.25 mg Intravenous Q8H PRN Barton Dubois, MD      . memantine [facility]) tablet 5 mg  5 mg Oral Daily Emokpae, Courage, MD   5 mg at 02/11/20 0935  . metoprolol succinate (TOPROL-XL) 24 hr tablet 50 mg  50 mg Oral BID Barton Dubois, MD   50 mg at 02/11/20 0936  . ondansetron (ZOFRAN) tablet 4 mg  4 mg Oral Q6H PRN Emokpae, Courage, MD       Or  . ondansetron (ZOFRAN) injection 4 mg  4 mg Intravenous Q6H PRN Emokpae, Courage, MD      . polyethylene glycol (MIRALAX / GLYCOLAX) packet 17 g  17 g Oral Daily Emokpae, Courage, MD   17 g at 02/09/20 0931  . sodium chloride flush (NS) 0.9 % injection 3 mL  3 mL Intravenous Q12H Emokpae, Courage, MD   3 mL at 02/10/20 2144  . sodium chloride flush (NS) 0.9 % injection 3 mL  3 mL Intravenous Q12H Emokpae, Courage, MD   3 mL at 02/11/20 1116  . sodium chloride flush (NS) 0.9 % injection 3 mL  3 mL Intravenous PRN Emokpae, Courage, MD      . traZODone (DESYREL) tablet 50 mg  50 mg Oral QHS Roxan Hockey, MD   50 mg at 02/09/20 2121     Discharge Medications: Please see discharge summary for a list of discharge medications.  Relevant Imaging Results:  Relevant Lab Results:   Additional Information SSN [id number]  Boneta Lucks, RN

## 2020-02-11 NOTE — Evaluation (Signed)
Physical Therapy Evaluation Patient Details Name: Theresa Hunt MRN: [id number] DOB: [birth year] Today's Date: 02/11/2020   History of Present Illness  Theresa Hunt  is a 86 y.o. female with past medical history relevant for of dementia, Interstitial cystitis/ s/p Prior Cystectomy with Uretero-ileaconduity, HLD, HTN, history of paroxysmal SVT, and diabetes mellitus presenting to the ED via EMS with fevers up to 102.3 after being found on the floor home    Clinical Impression  Patient requires Mod assist to move legs during bed mobility, has difficulty completing sit to stands due to BLE weakness, limited to ambulating to bathroom with slow labored movement, transferred to commode in bathroom and tolerated sitting up in chair with her family member present at bedside.  Patient on room air throughout visit with SpO2 at 95% at end of therapy - RN notified.  Patient will benefit from continued physical therapy in hospital and recommended venue below to increase strength, balance, endurance for safe ADLs and gait.    Follow Up Recommendations SNF    Equipment Recommendations  None recommended by PT    Recommendations for Other Services       Precautions / Restrictions Precautions Precautions: Fall Restrictions Weight Bearing Restrictions: No      Mobility  Bed Mobility Overal bed mobility: Needs Assistance Bed Mobility: Supine to Sit     Supine to sit: Min assist;Mod assist     General bed mobility comments: increased time, labored movement    Transfers Overall transfer level: Needs assistance Equipment used: Rolling walker (2 wheeled) Transfers: Sit to/from [facility] Sit to Stand: Min assist Stand pivot transfers: Min assist       General transfer comment: increased time, labored movement  Ambulation/Gait Ambulation/Gait assistance: Min assist;Mod assist Gait Distance (Feet): 15 Feet Assistive device: Rolling walker (2 wheeled) Gait Pattern/deviations:  Decreased step length - right;Decreased step length - left;Decreased stride length Gait velocity: decreased   General Gait Details: slow labored movement without loss of balance, limited mostly due to fatigue  Stairs            Wheelchair Mobility    Modified Rankin (Stroke Patients Only)       Balance Overall balance assessment: Needs assistance Sitting-balance support: Feet supported;No upper extremity supported Sitting balance-Leahy Scale: Fair Sitting balance - Comments: fair/good seated at EOB   Standing balance support: During functional activity;Bilateral upper extremity supported Standing balance-Leahy Scale: Fair Standing balance comment: using RW                             Pertinent Vitals/Pain Pain Assessment: No/denies pain    Home Living Family/patient expects to be discharged to:: Private residence Living Arrangements: Alone Available Help at Discharge: Family;Available PRN/intermittently Type of Home: House Home Access: Ramped entrance     Home Layout: One level Home Equipment: Bedside commode;Shower seat;Walker - 2 wheels Additional Comments: Patient takes sponge baths with assistance    Prior Function Level of Independence: Needs assistance   Gait / Transfers Assistance Needed: household and short distanced community ambulator, recently has been using RW but poor carryover per family member  ADL's / Hydrologist Assistance Needed: assisted with some household and all of community ADLs        Hand Dominance        Extremity/Trunk Assessment   Upper Extremity Assessment Upper Extremity Assessment: Generalized weakness    Lower Extremity Assessment Lower Extremity Assessment: Generalized weakness    Cervical /  Trunk Assessment Cervical / Trunk Assessment: Normal  Communication   Communication: No difficulties  Cognition Arousal/Alertness: Awake/alert Behavior During Therapy: Flat affect Overall Cognitive Status:  History of cognitive impairments - at baseline                                 General Comments: requires occasional repeated verbal/tactile cues to complete tasks      General Comments      Exercises     Assessment/Plan    PT Assessment Patient needs continued PT services  PT Problem List Decreased strength;Decreased activity tolerance;Decreased balance;Decreased mobility       PT Treatment Interventions Gait training;DME instruction;Stair training;Functional mobility training;Therapeutic exercise;Therapeutic activities;Patient/family education;Balance training    PT Goals (Current goals can be found in the Care Plan section)  Acute Rehab PT Goals Patient Stated Goal: return home PT Goal Formulation: With patient/family Time For Goal Achievement: 02/25/20 Potential to Achieve Goals: Good    Frequency Min 3X/week   Barriers to discharge        Co-evaluation               AM-PAC PT "6 Clicks" Mobility  Outcome Measure Help needed turning from your back to your side while in a flat bed without using bedrails?: A Little Help needed moving from lying on your back to sitting on the side of a flat bed without using bedrails?: A Lot Help needed moving to and from a bed to a chair (including a wheelchair)?: A Lot Help needed standing up from a chair using your arms (e.g., wheelchair or bedside chair)?: A Lot Help needed to walk in hospital room?: A Lot Help needed climbing 3-5 steps with a railing? : Total 6 Click Score: 12    End of Session   Activity Tolerance: Patient tolerated treatment well;Patient limited by lethargy Patient left: in chair;with call bell/phone within reach Nurse Communication: Mobility status PT Visit Diagnosis: Unsteadiness on feet (R26.81);Other abnormalities of gait and mobility (R26.89);Muscle weakness (generalized) (M62.81)    Time: 9323-5573 PT Time Calculation (min) (ACUTE ONLY): 36 min   Charges:   PT Evaluation $PT  Eval Moderate Complexity: 1 Mod PT Treatments $Therapeutic Activity: 23-37 mins        2:57 PM, 02/11/20 Lonell Grandchild, MPT Physical Therapist with [facility] [phone] office [phone] mobile phone

## 2020-02-11 NOTE — Plan of Care (Signed)
  Problem: Acute Rehab PT Goals(only PT should resolve) Goal: Pt Will Go Supine/Side To Sit 02/11/2020 1500 by Lonell Grandchild, PT Outcome: Progressing Flowsheets (Taken 02/11/2020 1458) Pt will go Supine/Side to Sit: with min guard assist 02/11/2020 1458 by Lonell Grandchild, PT Outcome: Progressing Flowsheets (Taken 02/11/2020 1458) Pt will go Supine/Side to Sit: with min guard assist Goal: Patient Will Transfer Sit To/From Stand 02/11/2020 1500 by Lonell Grandchild, PT Outcome: Progressing 02/11/2020 1458 by Lonell Grandchild, PT Outcome: Progressing Flowsheets (Taken 02/11/2020 1458) Patient will transfer sit to/from stand: with minimal assist Goal: Pt Will Transfer Bed To Chair/Chair To Bed 02/11/2020 1500 by Lonell Grandchild, PT Outcome: Progressing 02/11/2020 1458 by Lonell Grandchild, PT Outcome: Progressing Flowsheets (Taken 02/11/2020 1458) Pt will Transfer Bed to Chair/Chair to Bed:  with min assist  min guard assist Goal: Pt Will Ambulate 02/11/2020 1500 by Lonell Grandchild, PT Outcome: Progressing 02/11/2020 1458 by Lonell Grandchild, PT Outcome: Progressing Flowsheets (Taken 02/11/2020 1458) Pt will Ambulate:  50 feet  with minimal assist  with min guard assist  with rolling walker   3:00 PM, 02/11/20 Lonell Grandchild, MPT Physical Therapist with [facility] [phone] office [phone] mobile phone

## 2020-02-11 NOTE — Progress Notes (Addendum)
PROGRESS NOTE    Theresa Hunt  FTD:[id number] DOB: 1929/12/30 DOA: 02/08/2020 PCP: [name], MD   Chief Complaint  Patient presents with  . Weakness    Brief Narrative:  As per H&P written by Dr. Denton Brick on 02/08/2020 Theresa Hunt  is a 86 y.o. female with past medical history relevant for of dementia, Interstitial cystitis/ s/p Prior Cystectomy with Uretero-ileaconduity, HLD, HTN, history of paroxysmal SVT, and diabetes mellitus presenting to the ED via EMS with fevers up to 102.3 after being found on the floor home -- History is limited due to underlying dementia -It is unclear how long patient has been on the floor, patient was somewhat disoriented and confused, however no obvious injuries noted -Patient repeatedly denies any pain -Work-up in the ED reveals chest x-ray showed without acute findings -Troponin of 21 -Initial lactic acid was 1.8, repeat lactic acid was 2.8--patient received IV fluid boluses -Total CK only 29 -Chemistry with a sodium of 131 glucose is 253 creatinine is 1.08 with a BUN of 29, LFTs are not elevated -Remarkable for white count of 15.1 and platelets of 140 hemoglobin is 14 -From urostomy bag suggestive of UTI--- -EDP obtain urine culture and blood cultures and give IV cefepime vancomycin and Flagyl.  Assessment & Plan: 1-sepsis [facility]): Present on admission -In the setting of E. coli UTI and bacteremia -Continue IV fluids, IV antibiotics (using Rocephin), antipyretics and as needed antiemetics. -Will continue supportive care and follow clinical response -Continue to follow culture results. -Plan is to complete a total of 3 days IV prior to transition to oral route and complete antibiotic therapy.  2-Interstitial cystitis/ s/p Prior Cystectomy with Uretero-ileaconduit -Urostomy bag in place -No signs of hematuria -Continue treatment for UTI as mentioned above.  3-Dementia ([place]) -Continue supportive care and constant reorientation -Resume the use  of Namenda -As needed benzodiazepines dose has been adjusted to minimize overt sedation.  4-HTN (hypertension) -Continue metoprolol -Blood pressure stable.  5-H/o Paroxysmal SVT (supraventricular tachycardia) (HCC) -Continue metoprolol, continue IV fluids and treatment for sepsis -Metoprolol dose has been adjusted for better control of her heart rate. -Telemetry monitoring.  6-type II diabetes -Holding oral hypoglycemic agents while inpatient -Continue the use of sliding scale insulin  7-hyponatremia/hypokalemia/dehydration -Continue IV fluid, will adjust rate -Continue to follow-up electrolites trend and further replete as needed.. -sodium is 137 and potassium 2.8 today  8-weakness and fall at home -No acute fracture or obvious injuries appreciated -Physical therapy has seen patient and recommended skilled nursing facility at discharge. -Family in agreement to pursue rehabilitation placement.  9-stage II left buttock pressure injury -Continue constant repositioning -Continue preventive measures -Present on admission; no signs of superimposed infection.  DVT prophylaxis: Heparin Code Status: Full code Family Communication: Son at bedside. Disposition:   Status is: Inpatient  Dispo: The patient is from: Home              Anticipated d/c is to: Home with caregivers.              Anticipated d/c date is: 02/11/20              Patient currently no medically stable for discharge; still receiving IV antibiotics as she was feeling nauseous overnight.  Continue IV antibiotics base on culture sensitivity and transition to oral route hopefully able to tolerate diet and medications.  Patient has been seen by physical therapy and recommendations given for skilled nursing facility.  Family in agreement.     Consultants:  None   Procedures:  See below for x-ray reports.  Antimicrobials:  Cefepime>>02/07/10/29 Vancomycin  02/08/20>>>>02/09/20 Rocephin  11/29  Subjective: No chest pain, no nausea, no vomiting today; expressed feeling slightly nauseated yesterday.  Patient is afebrile.  No abdominal pain or dysuria reported.  Expressed feeling weak and tired.  Objective: Vitals:   02/11/20 0549 02/11/20 0800 02/11/20 1028 02/11/20 1414  BP:  (!) 168/99 (!) 155/83 (!) 167/113  Pulse: (!) 126 (!) 124 (!) 127 (!) 126  Resp:  20 20 20   Temp:  98.4 F (36.9 C) 98.2 F (36.8 C) 98.3 F (36.8 C)  TempSrc:  Oral Oral Oral  SpO2:  97% 98% 98%  Weight:      Height:        Intake/Output Summary (Last 24 hours) at 02/11/2020 1455 Last data filed at 02/11/2020 1116 Gross per 24 hour  Intake 1212.27 ml  Output 2200 ml  Net -987.73 ml   Filed Weights   02/08/20 1731 02/10/20 0150 02/11/20 0414  Weight: 66.2 kg 66.2 kg 67.9 kg    Examination: General exam: Alert, awake, oriented x 2; able to follow simple commands without problems and expressing feeling weak and tired.  Denies dysuria and has been afebrile.  Patient reports feeling slightly nauseous overnight. Respiratory system: Good air movement bilaterally; no using accessory muscle. Cardiovascular system: Sinus tachycardia; no rubs, no gallops, no JVD. Gastrointestinal system: Abdomen is nondistended, soft and nontender. No organomegaly or masses felt. Normal bowel sounds heard. Central nervous system: No focal neurological deficits. Extremities: No cyanosis or clubbing. Skin: Stage II left buttock pressure injury present at time of admission; no signs of superimposed infection. Psychiatry: Mood & affect appropriate.     Data Reviewed: I have personally reviewed following labs and imaging studies  CBC: Recent Labs  Lab 02/08/20 1718 02/09/20 0554 02/10/20 0710  WBC 15.1* 12.3* 7.3  NEUTROABS 13.6*  --   --   HGB 14.0 12.6 12.1  HCT 42.1 38.3 36.3  MCV 91.1 91.4 91.2  PLT 140* 106* 116*    Basic Metabolic Panel: Recent Labs  Lab 02/08/20 1718 02/09/20 0554  02/10/20 0710 02/11/20 0719  NA 131* 135 136 137  K 3.5 2.9* 3.3* 2.8*  CL 98 104 103 103  CO2 23 24 24 25   GLUCOSE 253* 181* 133* 85  BUN 29* 24* 20 20  CREATININE 1.08* 0.85 0.67 0.72  CALCIUM 9.7 9.2 9.3 9.0  MG  --   --   --  1.7    GFR: Estimated Creatinine Clearance: 43.7 mL/min (by C-G formula based on SCr of 0.72 mg/dL).  Liver Function Tests: Recent Labs  Lab 02/08/20 1718  AST 35  ALT 25  ALKPHOS 106  BILITOT 0.7  PROT 7.2  ALBUMIN 3.8    CBG: Recent Labs  Lab 02/10/20 1248 02/10/20 1631 02/10/20 1957 02/11/20 0735 02/11/20 1118  GLUCAP 96 93 88 75 112*     Recent Results (from the past 240 hour(s))  Urine culture     Status: Abnormal   Collection Time: 02/08/20  5:22 PM   Specimen: In/Out Cath Urine  Result Value Ref Range Status   Specimen Description   Final    IN/OUT CATH URINE Performed at [facility], [address], [place], [place] [address]    Special Requests   Final    NONE Performed at [facility], [address] [place], [place] [address]    Culture MULTIPLE SPECIES PRESENT, SUGGEST RECOLLECTION (A)  Final   Report Status 02/10/2020 FINAL  Final  Resp Panel by RT-PCR (Flu A&B, Covid) Nasopharyngeal Swab     Status: None   Collection Time: 02/08/20  5:24 PM   Specimen: Nasopharyngeal Swab; Nasopharyngeal(NP) swabs in vial transport medium  Result Value Ref Range Status   SARS Coronavirus 2 by RT PCR NEGATIVE NEGATIVE Final    Comment: (NOTE) SARS-CoV-2 target nucleic acids are NOT DETECTED.  The SARS-CoV-2 RNA is generally detectable in upper respiratory specimens during the acute phase of infection. The lowest concentration of SARS-CoV-2 viral copies this assay can detect is 138 copies/mL. A negative result does not preclude SARS-Cov-2 infection and should not be used as the sole basis for treatment or other patient management decisions. A negative result may occur with  improper specimen collection/handling, submission of  specimen other than nasopharyngeal swab, presence of viral mutation(s) within the areas targeted by this assay, and inadequate number of viral copies(<138 copies/mL). A negative result must be combined with clinical observations, patient history, and epidemiological information. The expected result is Negative.  Fact Sheet for Patients:  EntrepreneurPulse.com.au  Fact Sheet for Healthcare Providers:  IncredibleEmployment.be  This test is no t yet approved or cleared by the [place] FDA and  has been authorized for detection and/or diagnosis of SARS-CoV-2 by FDA under an Emergency Use Authorization (EUA). This EUA will remain  in effect (meaning this test can be used) for the duration of the COVID-19 declaration under Section 564(b)(1) of the Act, 21 U.S.C.section 360bbb-3(b)(1), unless the authorization is terminated  or revoked sooner.       Influenza A by PCR NEGATIVE NEGATIVE Final   Influenza B by PCR NEGATIVE NEGATIVE Final    Comment: (NOTE) The Xpert Xpress SARS-CoV-2/FLU/RSV plus assay is intended as an aid in the diagnosis of influenza from Nasopharyngeal swab specimens and should not be used as a sole basis for treatment. Nasal washings and aspirates are unacceptable for Xpert Xpress SARS-CoV-2/FLU/RSV testing.  Fact Sheet for Patients: EntrepreneurPulse.com.au  Fact Sheet for Healthcare Providers: IncredibleEmployment.be  This test is not yet approved or cleared by the [place] FDA and has been authorized for detection and/or diagnosis of SARS-CoV-2 by FDA under an Emergency Use Authorization (EUA). This EUA will remain in effect (meaning this test can be used) for the duration of the COVID-19 declaration under Section 564(b)(1) of the Act, 21 U.S.C. section 360bbb-3(b)(1), unless the authorization is terminated or revoked.  Performed at [facility], [address]  [place], [place] [address]   Blood Culture (routine x 2)     Status: Abnormal   Collection Time: 02/08/20  5:35 PM   Specimen: BLOOD  Result Value Ref Range Status   Specimen Description   Final    BLOOD RIGHT ANTECUBITAL Performed at [facility], [address] [place], [place] [address]    Special Requests   Final    BOTTLES DRAWN AEROBIC AND ANAEROBIC Blood Culture adequate volume Performed at [facility], [address] [place], [place] [address]    Culture  Setup Time   Final    GRAM NEGATIVE RODS IN BOTH AEROBIC AND ANAEROBIC BOTTLES Gram Stain Report Called to,Read Back By and Verified With: CRAWFORD H. @ 0815 ON [id number] BY HENDERSONL. CRITICAL RESULT CALLED TO, READ BACK BY AND VERIFIED WITH: RN Cheryll Dessert [id number] MLM Performed at [facility], [address] [place],  [address]    Culture ESCHERICHIA COLI (A)  Final   Report  Status 02/11/2020 FINAL  Final   Organism ID, Bacteria ESCHERICHIA COLI  Final      Susceptibility   Escherichia coli - MIC*    AMPICILLIN >=32 RESISTANT Resistant     CEFAZOLIN <=4 SENSITIVE Sensitive     CEFEPIME <=0.12 SENSITIVE Sensitive     CEFTAZIDIME <=1 SENSITIVE Sensitive     CEFTRIAXONE <=0.25 SENSITIVE Sensitive     CIPROFLOXACIN <=0.25 SENSITIVE Sensitive     GENTAMICIN <=1 SENSITIVE Sensitive     IMIPENEM <=0.25 SENSITIVE Sensitive     TRIMETH/SULFA >=320 RESISTANT Resistant     AMPICILLIN/SULBACTAM >=32 RESISTANT Resistant     PIP/TAZO <=4 SENSITIVE Sensitive     * ESCHERICHIA COLI  Blood Culture ID Panel (Reflexed)     Status: Abnormal   Collection Time: 02/08/20  5:35 PM  Result Value Ref Range Status   Enterococcus faecalis NOT DETECTED NOT DETECTED Final   Enterococcus Faecium NOT DETECTED NOT DETECTED Final   Listeria monocytogenes NOT DETECTED NOT DETECTED Final   Staphylococcus species NOT DETECTED NOT DETECTED Final   Staphylococcus aureus (BCID) NOT DETECTED NOT DETECTED Final   Staphylococcus epidermidis NOT  DETECTED NOT DETECTED Final   Staphylococcus lugdunensis NOT DETECTED NOT DETECTED Final   Streptococcus species NOT DETECTED NOT DETECTED Final   Streptococcus agalactiae NOT DETECTED NOT DETECTED Final   Streptococcus pneumoniae NOT DETECTED NOT DETECTED Final   Streptococcus pyogenes NOT DETECTED NOT DETECTED Final   A.calcoaceticus-baumannii NOT DETECTED NOT DETECTED Final   Bacteroides fragilis NOT DETECTED NOT DETECTED Final   Enterobacterales DETECTED (A) NOT DETECTED Final    Comment: Enterobacterales represent a large order of gram negative bacteria, not a single organism. CRITICAL RESULT CALLED TO, READ BACK BY AND VERIFIED WITH: RN M DOSS [id number] MLM    Enterobacter cloacae complex NOT DETECTED NOT DETECTED Final   Escherichia coli DETECTED (A) NOT DETECTED Final    Comment: CRITICAL RESULT CALLED TO, READ BACK BY AND VERIFIED WITH: RN M DOSS [id number] MLM    Klebsiella aerogenes NOT DETECTED NOT DETECTED Final   Klebsiella oxytoca NOT DETECTED NOT DETECTED Final   Klebsiella pneumoniae NOT DETECTED NOT DETECTED Final   Proteus species NOT DETECTED NOT DETECTED Final   Salmonella species NOT DETECTED NOT DETECTED Final   Serratia marcescens NOT DETECTED NOT DETECTED Final   Haemophilus influenzae NOT DETECTED NOT DETECTED Final   Neisseria meningitidis NOT DETECTED NOT DETECTED Final   Pseudomonas aeruginosa NOT DETECTED NOT DETECTED Final   Stenotrophomonas maltophilia NOT DETECTED NOT DETECTED Final   Candida albicans NOT DETECTED NOT DETECTED Final   Candida auris NOT DETECTED NOT DETECTED Final   Candida glabrata NOT DETECTED NOT DETECTED Final   Candida krusei NOT DETECTED NOT DETECTED Final   Candida parapsilosis NOT DETECTED NOT DETECTED Final   Candida tropicalis NOT DETECTED NOT DETECTED Final   Cryptococcus neoformans/gattii NOT DETECTED NOT DETECTED Final   CTX-M ESBL NOT DETECTED NOT DETECTED Final   Carbapenem resistance IMP NOT DETECTED NOT DETECTED  Final   Carbapenem resistance KPC NOT DETECTED NOT DETECTED Final   Carbapenem resistance NDM NOT DETECTED NOT DETECTED Final   Carbapenem resist OXA 48 LIKE NOT DETECTED NOT DETECTED Final   Carbapenem resistance VIM NOT DETECTED NOT DETECTED Final    Comment: Performed at [facility], [address] [place], [place] [address]  Blood Culture (routine x 2)     Status: Abnormal   Collection Time: 02/08/20  5:36 PM  Specimen: BLOOD RIGHT ARM  Result Value Ref Range Status   Specimen Description   Final    BLOOD RIGHT ARM Performed at [facility], [address] [place], [place] [address]    Special Requests   Final    BOTTLES DRAWN AEROBIC ONLY Blood Culture adequate volume Performed at [facility], [address] [place], [place] [address]    Culture  Setup Time   Final    GRAM NEGATIVE RODS AEROBIC BOTTLE ONLY Gram Stain Report Called to,Read Back By and Verified With: CRAWFORD @ 7412 ON [id number] BY HENDERSON L. CRITICAL VALUE NOTED.  VALUE IS CONSISTENT WITH PREVIOUSLY REPORTED AND CALLED VALUE.    Culture (A)  Final    ESCHERICHIA COLI SUSCEPTIBILITIES PERFORMED ON PREVIOUS CULTURE WITHIN THE LAST 5 DAYS. Performed at [facility], [place] [address], [place], [place] [address]    Report Status 02/11/2020 FINAL  Final     Radiology Studies: No results found. Scheduled Meds: . ascorbic acid  500 mg Oral Daily  . aspirin EC  81 mg Oral Daily  . cholecalciferol  2,000 Units Oral Daily  . heparin  5,000 Units Subcutaneous Q8H  . insulin aspart  0-5 Units Subcutaneous QHS  . insulin aspart  0-6 Units Subcutaneous TID WC  . loratadine  5 mg Oral Daily  . memantine  5 mg Oral Daily  . metoprolol succinate  75 mg Oral BID  . metoprolol tartrate  2.5 mg Intravenous Once  . polyethylene glycol  17 g Oral Daily  . sodium chloride flush  3 mL Intravenous Q12H  . sodium chloride flush  3 mL Intravenous Q12H  . traZODone  50 mg Oral QHS   Continuous Infusions: .  sodium chloride    . cefTRIAXone (ROCEPHIN)  IV 2 g (02/11/20 1115)  . lactated ringers Stopped (02/10/20 1427)     LOS: 3 days    Time spent: 30 minutes    Barton Dubois, MD [facility]   To contact the attending provider between 7A-7P or the covering provider during after hours 7P-7A, please log into the web site [email or web address] and access using universal [place] password for that web site. If you do not have the password, please call the hospital operator.  02/11/2020, 2:55 PM

## 2020-02-11 NOTE — Progress Notes (Addendum)
Patient at rest.  HR 124.  BP 168/99.  Dr. Dyann Kief notified.    PO metoprolol given @ 0930 per Dr Dyann Kief.  See MAR  Vitals rechecked @ 1030.  HR 127, BP 155/83.  Provider notified.  Vitals rechecked @ 1416.  HR  126, BP 167/113.  Provider notified.  2.5 mg IV metoprolol ordered.  See MAR.

## 2020-02-11 NOTE — TOC Progression Note (Addendum)
30 Day note   Patient Details  Name: Theresa Hunt MRN: [id number] Date of Birth: Sep 22, 1929  Transition of Care [facility]) CM/SW Contact  Boneta Lucks, RN Phone Number: 02/11/2020, 3:23 PM    Please be advised that the above name patient will require a short-term nursing home stay. Anticipated 30 days or less rehabilitation and strengthening. The plan is for return home.    Expected Discharge Plan: Skilled Nursing Facility Barriers to Discharge: Continued Medical Work up  Expected Discharge Plan and Services Expected Discharge Plan: [place]

## 2020-02-12 DIAGNOSIS — G301 Alzheimer's disease with late onset: Secondary | ICD-10-CM | POA: Diagnosis not present

## 2020-02-12 DIAGNOSIS — I10 Essential (primary) hypertension: Secondary | ICD-10-CM | POA: Diagnosis not present

## 2020-02-12 DIAGNOSIS — L89322 Pressure ulcer of left buttock, stage 2: Secondary | ICD-10-CM

## 2020-02-12 DIAGNOSIS — A419 Sepsis, unspecified organism: Secondary | ICD-10-CM | POA: Diagnosis not present

## 2020-02-12 DIAGNOSIS — N3090 Cystitis, unspecified without hematuria: Secondary | ICD-10-CM | POA: Diagnosis not present

## 2020-02-12 LAB — GLUCOSE, CAPILLARY
Glucose-Capillary: 131 mg/dL — ABNORMAL HIGH (ref 70–99)
Glucose-Capillary: 184 mg/dL — ABNORMAL HIGH (ref 70–99)
Glucose-Capillary: 185 mg/dL — ABNORMAL HIGH (ref 70–99)
Glucose-Capillary: 198 mg/dL — ABNORMAL HIGH (ref 70–99)
Glucose-Capillary: 239 mg/dL — ABNORMAL HIGH (ref 70–99)

## 2020-02-12 MED ORDER — POTASSIUM CHLORIDE CRYS ER 20 MEQ PO TBCR
40.0000 meq | EXTENDED_RELEASE_TABLET | Freq: Once | ORAL | Status: AC
  Administered 2020-02-12: 40 meq via ORAL
  Filled 2020-02-12: qty 2

## 2020-02-12 MED ORDER — CEFDINIR 300 MG PO CAPS
300.0000 mg | ORAL_CAPSULE | Freq: Two times a day (BID) | ORAL | Status: DC
  Administered 2020-02-13 – 2020-02-15 (×5): 300 mg via ORAL
  Filled 2020-02-12 (×5): qty 1

## 2020-02-12 NOTE — Progress Notes (Signed)
Physical Therapy Treatment Patient Details Name: Theresa Hunt MRN: [id number] DOB: 11/20/1929 Today's Date: 02/12/2020    History of Present Illness Theresa Hunt  is a 86 y.o. female with past medical history relevant for of dementia, Interstitial cystitis/ s/p Prior Cystectomy with Uretero-ileaconduity, HLD, HTN, history of paroxysmal SVT, and diabetes mellitus presenting to the ED via EMS with fevers up to 102.3 after being found on the floor home    PT Comments    Pt sitting in chair with son present in room, friendly and willing to participate.  Pt presents with LE weakness with increased difficulty with STS, required multiple attempts and cueing for handplacement to assist.  Seated LE strengthening exercises complete with good control noted.  Able to increase distance with gait training using RW, cueing to stand within walker for safety.  No LOB episodes, did drift from Rt to Lt down hallway.  EOS pt returned to chair with fatigue.  Left in chair with son in room, chair alarm set and call bell/telephone within reach.  No reports of pain through session.     Follow Up Recommendations  SNF     Equipment Recommendations  None recommended by PT    Recommendations for Other Services       Precautions / Restrictions Precautions Precautions: Fall Restrictions Weight Bearing Restrictions: No    Mobility  Bed Mobility                  Transfers Overall transfer level: Needs assistance Equipment used: Rolling walker (2 wheeled) Transfers: Sit to/from Stand Sit to Stand: Min assist         General transfer comment: increased time, labored movement, cueing for hand placement to assist safely  Ambulation/Gait Ambulation/Gait assistance: Min assist Gait Distance (Feet): 35 Feet Assistive device: Rolling walker (2 wheeled) Gait Pattern/deviations: Decreased step length - right;Decreased step length - left;Decreased stride length Gait velocity: decreased   General Gait  Details: slow labored movement without loss of balance, limited mostly due to fatigue   Stairs             Wheelchair Mobility    Modified Rankin (Stroke Patients Only)       Balance                                            Cognition Arousal/Alertness: Awake/alert Behavior During Therapy: Flat affect Overall Cognitive Status: History of cognitive impairments - at baseline                                 General Comments: requires occasional repeated verbal/tactile cues to complete tasks      Exercises General Exercises - Lower Extremity Ankle Circles/Pumps: AROM;Both;15 reps;Seated Long Arc Quad: AROM;Both;10 reps;Seated Hip Flexion/Marching: AROM;10 reps;Seated    General Comments        Pertinent Vitals/Pain Pain Assessment: No/denies pain    Home Living                      Prior Function            PT Goals (current goals can now be found in the care plan section)      Frequency    Min 3X/week      PT Plan Current plan remains appropriate  Co-evaluation              AM-PAC PT "6 Clicks" Mobility   Outcome Measure  Help needed turning from your back to your side while in a flat bed without using bedrails?: A Little Help needed moving from lying on your back to sitting on the side of a flat bed without using bedrails?: A Lot Help needed moving to and from a bed to a chair (including a wheelchair)?: A Lot Help needed standing up from a chair using your arms (e.g., wheelchair or bedside chair)?: A Lot Help needed to walk in hospital room?: A Lot Help needed climbing 3-5 steps with a railing? : Total 6 Click Score: 12    End of Session Equipment Utilized During Treatment: Gait belt Activity Tolerance: Patient tolerated treatment well;Patient limited by lethargy Patient left: in chair;with call bell/phone within reach;with chair alarm set Nurse Communication: Mobility status PT Visit  Diagnosis: Unsteadiness on feet (R26.81);Other abnormalities of gait and mobility (R26.89);Muscle weakness (generalized) (M62.81)     Time: 2876-8115 PT Time Calculation (min) (ACUTE ONLY): 17 min  Charges:  $Therapeutic Activity: 8-22 mins                     Ihor Austin, LPTA/CLT; CBIS [phone]  [name] 02/12/2020, 10:36 AM

## 2020-02-12 NOTE — Progress Notes (Signed)
Oriented to self and aware of having to go to bathroom.  Assisted to bathroom and had bm.  Son present for several houres and helped patient eat.  Sat in chair most of day and just assisted back to bed.

## 2020-02-12 NOTE — Progress Notes (Signed)
Patient repeatedly climbing out of bed. Oriented to self only. Patient already has pulled out 2 IV's at this time. Patient keeps sliding to end of bed to get out. Pt legs placed back in  And pt stated "Don't you hit me". Instructed patient that we are here to help patient and not harm. Patient very distrusting of staff at current time. Frequent rounding will be performed bed alarm is on at this time.

## 2020-02-12 NOTE — Progress Notes (Signed)
PROGRESS NOTE    Theresa Hunt  VEL:[id number] DOB: [birth year] DOA: 02/08/2020 PCP: [name], MD   Chief Complaint  Patient presents with  . Weakness    Brief Narrative:  As per H&P written by Dr. Denton Brick on 02/08/2020 Theresa Hunt  is a 86 y.o. female with past medical history relevant for of dementia, Interstitial cystitis/ s/p Prior Cystectomy with Uretero-ileaconduity, HLD, HTN, history of paroxysmal SVT, and diabetes mellitus presenting to the ED via EMS with fevers up to 102.3 after being found on the floor home -- History is limited due to underlying dementia -It is unclear how long patient has been on the floor, patient was somewhat disoriented and confused, however no obvious injuries noted -Patient repeatedly denies any pain -Work-up in the ED reveals chest x-ray showed without acute findings -Troponin of 21 -Initial lactic acid was 1.8, repeat lactic acid was 2.8--patient received IV fluid boluses -Total CK only 29 -Chemistry with a sodium of 131 glucose is 253 creatinine is 1.08 with a BUN of 29, LFTs are not elevated -Remarkable for white count of 15.1 and platelets of 140 hemoglobin is 14 -From urostomy bag suggestive of UTI--- -EDP obtain urine culture and blood cultures and give IV cefepime vancomycin and Flagyl.  Assessment & Plan: 1-sepsis [facility]): Present on admission -In the setting of E. coli UTI and bacteremia; Micronase and is resistant to ampicillin and Bactrim. -Continue to maintain adequate oral hydration -Continue as needed antipyretics and antiemetics. -Continue oral antibiotics to complete therapy. -Patient is afebrile, with normal WBCs -Will continue supportive care and follow clinical response  2-Interstitial cystitis/ s/p Prior Cystectomy with Uretero-ileaconduit -Urostomy bag in place -No signs of hematuria -Continue treatment for UTI as mentioned above.  3-Dementia ([place]) -Continue supportive care and constant reorientation -Resume the use  of Namenda -As needed benzodiazepines dose has been adjusted to minimize overt sedation.  4-HTN (hypertension) -Continue metoprolol -Blood pressure stable.  5-H/o Paroxysmal SVT (supraventricular tachycardia) (HCC) -Continue metoprolol, continue IV fluids and treatment for sepsis -Metoprolol dose has been adjusted for better control of her heart rate. -Continue telemetry monitoring. -Despite episode of SVT patient is completely asymptomatic.  6-type II diabetes -Holding oral hypoglycemic agents while inpatient -Continue the use of sliding scale insulin  7-hyponatremia/hypokalemia/dehydration -Continue IV fluid, will adjust rate -Continue to follow-up electrolites trend and further replete as needed.. -sodium is 137 and stable; continue to follow trend intermittently. -Will continue potassium repletion and follow electrolytes.  8-weakness and fall at home -No acute fracture or obvious injuries appreciated -Physical therapy has seen patient and recommended skilled nursing facility at discharge. -Family in agreement to pursue rehabilitation placement.  9-stage II left buttock pressure injury -Continue constant repositioning -Continue preventive measures -Present on admission; no signs of superimposed infection.  DVT prophylaxis: Heparin Code Status: Full code Family Communication: Son at bedside. Disposition:   Status is: Inpatient  Dispo: The patient is from: Home              Anticipated d/c is to: Home with caregivers.              Anticipated d/c date is: 02/13/20              Patient currently medically stable for discharge; she has remained afebrile, no nausea, no vomiting and denying dysuria.  Has completed IV antibiotics for her bacteremia/UTI and is ready to complete therapy orally at this point.  Needs a skilled nursing facility for rehabilitation and further care.  Waiting insurance  authorization.       Consultants:   None   Procedures:  See below for x-ray  reports.  Antimicrobials:  Cefepime>>11/26>>11/29 Vancomycin  02/08/20>>>>02/09/20 Rocephin 11/29>>11/30 Cefdinir 11/30  Subjective: No dysuria, no chest pain, no nausea, no vomiting.  Patient is afebrile.  SVT appreciated on telemetry.  In no acute distress.  Good urine output reported.  Objective: Vitals:   02/12/20 0547 02/12/20 1100 02/12/20 1249 02/12/20 1630  BP: (!) 175/92 133/80 (!) 154/88 137/86  Pulse: (!) 123 (!) 125 (!) 123 (!) 124  Resp: 18 16 (!) 21 20  Temp: 98.3 F (36.8 C) 98.2 F (36.8 C) 98.5 F (36.9 C) 98.2 F (36.8 C)  TempSrc: Oral Oral Oral   SpO2: 96% 95% 96% 94%  Weight:      Height:        Intake/Output Summary (Last 24 hours) at 02/12/2020 1636 Last data filed at 02/12/2020 0800 Gross per 24 hour  Intake 870 ml  Output 3900 ml  Net -3030 ml   Filed Weights   02/08/20 1731 02/10/20 0150 02/11/20 0414  Weight: 66.2 kg 66.2 kg 67.9 kg    Examination: General exam: Alert, awake, oriented x 2; in no acute distress and following commands appropriately.  Patient is currently afebrile and denies nausea or vomiting.  Feeling weak, tired and deconditioned.  No dysuria. Respiratory system: Clear to auscultation. Respiratory effort normal.  No requiring oxygen supplementation. Cardiovascular system: Sinus tachycardia appreciated on exam; no rubs, no gallops, no JVD.   Gastrointestinal system: Abdomen is nondistended, soft and nontender. No organomegaly or masses felt. Normal bowel sounds heard. Central nervous system: Alert and oriented. No focal neurological deficits. Extremities: No cyanosis or clubbing. Skin: N no petechiae; stage II left buttock pressure injury present at time of admission; no signs of superimposed infection appreciated. Psychiatry: Mood & affect appropriate.     Data Reviewed: I have personally reviewed following labs and imaging studies  CBC: Recent Labs  Lab 02/08/20 1718 02/09/20 0554 02/10/20 0710  WBC 15.1* 12.3*  7.3  NEUTROABS 13.6*  --   --   HGB 14.0 12.6 12.1  HCT 42.1 38.3 36.3  MCV 91.1 91.4 91.2  PLT 140* 106* 116*    Basic Metabolic Panel: Recent Labs  Lab 02/08/20 1718 02/09/20 0554 02/10/20 0710 02/11/20 0719  NA 131* 135 136 137  K 3.5 2.9* 3.3* 2.8*  CL 98 104 103 103  CO2 23 24 24 25   GLUCOSE 253* 181* 133* 85  BUN 29* 24* 20 20  CREATININE 1.08* 0.85 0.67 0.72  CALCIUM 9.7 9.2 9.3 9.0  MG  --   --   --  1.7    GFR: Estimated Creatinine Clearance: 43.7 mL/min (by C-G formula based on SCr of 0.72 mg/dL).  Liver Function Tests: Recent Labs  Lab 02/08/20 1718  AST 35  ALT 25  ALKPHOS 106  BILITOT 0.7  PROT 7.2  ALBUMIN 3.8    CBG: Recent Labs  Lab 02/11/20 1620 02/11/20 2041 02/12/20 0731 02/12/20 1104 02/12/20 1632  GLUCAP 193* 142* 131* 239* 198*     Recent Results (from the past 240 hour(s))  Urine culture     Status: Abnormal   Collection Time: 02/08/20  5:22 PM   Specimen: In/Out Cath Urine  Result Value Ref Range Status   Specimen Description   Final    IN/OUT CATH URINE Performed at [facility], [address], [place], [place] [address]    Special Requests  Final    NONE Performed at [facility], [address], [place], [place] [address]    Culture MULTIPLE SPECIES PRESENT, SUGGEST RECOLLECTION (A)  Final   Report Status 02/10/2020 FINAL  Final  Resp Panel by RT-PCR (Flu A&B, Covid) Nasopharyngeal Swab     Status: None   Collection Time: 02/08/20  5:24 PM   Specimen: Nasopharyngeal Swab; Nasopharyngeal(NP) swabs in vial transport medium  Result Value Ref Range Status   SARS Coronavirus 2 by RT PCR NEGATIVE NEGATIVE Final    Comment: (NOTE) SARS-CoV-2 target nucleic acids are NOT DETECTED.  The SARS-CoV-2 RNA is generally detectable in upper respiratory specimens during the acute phase of infection. The lowest concentration of SARS-CoV-2 viral copies this assay can detect is 138 copies/mL. A negative result does not preclude  SARS-Cov-2 infection and should not be used as the sole basis for treatment or other patient management decisions. A negative result may occur with  improper specimen collection/handling, submission of specimen other than nasopharyngeal swab, presence of viral mutation(s) within the areas targeted by this assay, and inadequate number of viral copies(<138 copies/mL). A negative result must be combined with clinical observations, patient history, and epidemiological information. The expected result is Negative.  Fact Sheet for Patients:  EntrepreneurPulse.com.au  Fact Sheet for Healthcare Providers:  IncredibleEmployment.be  This test is no t yet approved or cleared by the [place] FDA and  has been authorized for detection and/or diagnosis of SARS-CoV-2 by FDA under an Emergency Use Authorization (EUA). This EUA will remain  in effect (meaning this test can be used) for the duration of the COVID-19 declaration under Section 564(b)(1) of the Act, 21 U.S.C.section 360bbb-3(b)(1), unless the authorization is terminated  or revoked sooner.       Influenza A by PCR NEGATIVE NEGATIVE Final   Influenza B by PCR NEGATIVE NEGATIVE Final    Comment: (NOTE) The Xpert Xpress SARS-CoV-2/FLU/RSV plus assay is intended as an aid in the diagnosis of influenza from Nasopharyngeal swab specimens and should not be used as a sole basis for treatment. Nasal washings and aspirates are unacceptable for Xpert Xpress SARS-CoV-2/FLU/RSV testing.  Fact Sheet for Patients: EntrepreneurPulse.com.au  Fact Sheet for Healthcare Providers: IncredibleEmployment.be  This test is not yet approved or cleared by the [place] FDA and has been authorized for detection and/or diagnosis of SARS-CoV-2 by FDA under an Emergency Use Authorization (EUA). This EUA will remain in effect (meaning this test can be used) for the duration of  the COVID-19 declaration under Section 564(b)(1) of the Act, 21 U.S.C. section 360bbb-3(b)(1), unless the authorization is terminated or revoked.  Performed at [facility], [address], [place], [place] [address]   Blood Culture (routine x 2)     Status: Abnormal   Collection Time: 02/08/20  5:35 PM   Specimen: BLOOD  Result Value Ref Range Status   Specimen Description   Final    BLOOD RIGHT ANTECUBITAL Performed at [facility], [address], [place], [place] [address]    Special Requests   Final    BOTTLES DRAWN AEROBIC AND ANAEROBIC Blood Culture adequate volume Performed at [facility], [address], [place],  [address]    Culture  Setup Time   Final    GRAM NEGATIVE RODS IN BOTH AEROBIC AND ANAEROBIC BOTTLES Gram Stain Report Called to,Read Back By and Verified With: CRAWFORD H. @ 0815 ON [id number] BY HENDERSONL. CRITICAL RESULT CALLED TO, READ BACK BY AND VERIFIED WITH: RN M DOSS [phone]  [place] Performed at [facility], [place] [address], [place], [place] [address]    Culture ESCHERICHIA COLI (A)  Final   Report Status 02/11/2020 FINAL  Final   Organism ID, Bacteria ESCHERICHIA COLI  Final      Susceptibility   Escherichia coli - MIC*    AMPICILLIN >=32 RESISTANT Resistant     CEFAZOLIN <=4 SENSITIVE Sensitive     CEFEPIME <=0.12 SENSITIVE Sensitive     CEFTAZIDIME <=1 SENSITIVE Sensitive     CEFTRIAXONE <=0.25 SENSITIVE Sensitive     CIPROFLOXACIN <=0.25 SENSITIVE Sensitive     GENTAMICIN <=1 SENSITIVE Sensitive     IMIPENEM <=0.25 SENSITIVE Sensitive     TRIMETH/SULFA >=320 RESISTANT Resistant     AMPICILLIN/SULBACTAM >=32 RESISTANT Resistant     PIP/TAZO <=4 SENSITIVE Sensitive     * ESCHERICHIA COLI  Blood Culture ID Panel (Reflexed)     Status: Abnormal   Collection Time: 02/08/20  5:35 PM  Result Value Ref Range Status   Enterococcus faecalis NOT DETECTED NOT DETECTED Final   Enterococcus Faecium NOT DETECTED NOT DETECTED Final   Listeria  monocytogenes NOT DETECTED NOT DETECTED Final   Staphylococcus species NOT DETECTED NOT DETECTED Final   Staphylococcus aureus (BCID) NOT DETECTED NOT DETECTED Final   Staphylococcus epidermidis NOT DETECTED NOT DETECTED Final   Staphylococcus lugdunensis NOT DETECTED NOT DETECTED Final   Streptococcus species NOT DETECTED NOT DETECTED Final   Streptococcus agalactiae NOT DETECTED NOT DETECTED Final   Streptococcus pneumoniae NOT DETECTED NOT DETECTED Final   Streptococcus pyogenes NOT DETECTED NOT DETECTED Final   A.calcoaceticus-baumannii NOT DETECTED NOT DETECTED Final   Bacteroides fragilis NOT DETECTED NOT DETECTED Final   Enterobacterales DETECTED (A) NOT DETECTED Final    Comment: Enterobacterales represent a large order of gram negative bacteria, not a single organism. CRITICAL RESULT CALLED TO, READ BACK BY AND VERIFIED WITH: RN M DOSS [phone] MLM    Enterobacter cloacae complex NOT DETECTED NOT DETECTED Final   Escherichia coli DETECTED (A) NOT DETECTED Final    Comment: CRITICAL RESULT CALLED TO, READ BACK BY AND VERIFIED WITH: RN M DOSS [phone] MLM    Klebsiella aerogenes NOT DETECTED NOT DETECTED Final   Klebsiella oxytoca NOT DETECTED NOT DETECTED Final   Klebsiella pneumoniae NOT DETECTED NOT DETECTED Final   Proteus species NOT DETECTED NOT DETECTED Final   Salmonella species NOT DETECTED NOT DETECTED Final   Serratia marcescens NOT DETECTED NOT DETECTED Final   Haemophilus influenzae NOT DETECTED NOT DETECTED Final   Neisseria meningitidis NOT DETECTED NOT DETECTED Final   Pseudomonas aeruginosa NOT DETECTED NOT DETECTED Final   Stenotrophomonas maltophilia NOT DETECTED NOT DETECTED Final   Candida albicans NOT DETECTED NOT DETECTED Final   Candida auris NOT DETECTED NOT DETECTED Final   Candida glabrata NOT DETECTED NOT DETECTED Final   Candida krusei NOT DETECTED NOT DETECTED Final   Candida parapsilosis NOT DETECTED NOT DETECTED Final   Candida  tropicalis NOT DETECTED NOT DETECTED Final   Cryptococcus neoformans/gattii NOT DETECTED NOT DETECTED Final   CTX-M ESBL NOT DETECTED NOT DETECTED Final   Carbapenem resistance IMP NOT DETECTED NOT DETECTED Final   Carbapenem resistance KPC NOT DETECTED NOT DETECTED Final   Carbapenem resistance NDM NOT DETECTED NOT DETECTED Final   Carbapenem resist OXA 48 LIKE NOT DETECTED NOT DETECTED Final   Carbapenem resistance VIM NOT DETECTED NOT DETECTED Final    Comment: Performed at [facility], [address], [place], [place] [address]  Blood Culture (routine x 2)     Status: Abnormal   Collection Time: 02/08/20  5:36 PM   Specimen: BLOOD RIGHT ARM  Result Value Ref Range Status   Specimen Description   Final    BLOOD RIGHT ARM Performed at [facility], [address], [place], [place] [address]    Special Requests   Final    BOTTLES DRAWN AEROBIC ONLY Blood Culture adequate volume Performed at [facility], [address] [place], [place] [address]    Culture  Setup Time   Final    GRAM NEGATIVE RODS AEROBIC BOTTLE ONLY Gram Stain Report Called to,Read Back By and Verified With: CRAWFORD @ 0947 ON [id number] BY HENDERSON L. CRITICAL VALUE NOTED.  VALUE IS CONSISTENT WITH PREVIOUSLY REPORTED AND CALLED VALUE.    Culture (A)  Final    ESCHERICHIA COLI SUSCEPTIBILITIES PERFORMED ON PREVIOUS CULTURE WITHIN THE LAST 5 DAYS. Performed at [facility], [place] [address], [place], [place] [address]    Report Status 02/11/2020 FINAL  Final     Radiology Studies: No results found. Scheduled Meds: . ascorbic acid  500 mg Oral Daily  . aspirin EC  81 mg Oral Daily  . cefdinir  300 mg Oral Q12H  . cholecalciferol  2,000 Units Oral Daily  . heparin  5,000 Units Subcutaneous Q8H  . insulin aspart  0-5 Units Subcutaneous QHS  . insulin aspart  0-6 Units Subcutaneous TID WC  . loratadine  5 mg Oral Daily  . memantine  5 mg Oral Daily  . metoprolol succinate  75 mg Oral TID  .  polyethylene glycol  17 g Oral Daily  . sodium chloride flush  3 mL Intravenous Q12H  . traZODone  50 mg Oral QHS   Continuous Infusions: . sodium chloride    . lactated ringers Stopped (02/10/20 1427)     LOS: 4 days    Time spent: 30 minutes    Barton Dubois, MD [facility]   To contact the attending provider between 7A-7P or the covering provider during after hours 7P-7A, please log into the web site [email or web address] and access using universal [place] password for that web site. If you do not have the password, please call the hospital operator.  02/12/2020, 4:36 PM

## 2020-02-12 NOTE — Progress Notes (Signed)
   02/12/20 1100  Assess: MEWS Score  Temp 98.2 F (36.8 C)  BP 133/80  Pulse Rate (!) 125  Resp 16  Level of Consciousness Alert  SpO2 95 %  O2 Device Nasal Cannula  O2 Flow Rate (L/min) 2 L/min  Assess: MEWS Score  MEWS Temp 0  MEWS Systolic 0  MEWS Pulse 2  MEWS RR 0  MEWS LOC 0  MEWS Score 2  MEWS Score Color Yellow  Assess: if the MEWS score is Yellow or Red  Were vital signs taken at a resting state? Yes  Focused Assessment No change from prior assessment  Early Detection of Sepsis Score *See Row Information* Low  MEWS guidelines implemented *See Row Information* No, previously yellow, continue vital signs every 4 hours  Treat  MEWS Interventions Administered scheduled meds/treatments  Pain Score 0  Patients response to intervention  (bp lower)  Take Vital Signs  Increase Vital Sign Frequency   (no)  Escalate  MEWS: Escalate  (Tachycardia baseline)  Document  Patient Outcome Stabilized after interventions  Progress note created (see row info) Yes

## 2020-02-13 ENCOUNTER — Ambulatory Visit: Payer: Medicare HMO | Admitting: Family Medicine

## 2020-02-13 ENCOUNTER — Other Ambulatory Visit: Payer: Medicare HMO

## 2020-02-13 DIAGNOSIS — T85858D Stenosis due to other internal prosthetic devices, implants and grafts, subsequent encounter: Secondary | ICD-10-CM | POA: Diagnosis not present

## 2020-02-13 DIAGNOSIS — I10 Essential (primary) hypertension: Secondary | ICD-10-CM | POA: Diagnosis not present

## 2020-02-13 DIAGNOSIS — A419 Sepsis, unspecified organism: Secondary | ICD-10-CM | POA: Diagnosis not present

## 2020-02-13 DIAGNOSIS — N3010 Interstitial cystitis (chronic) without hematuria: Secondary | ICD-10-CM

## 2020-02-13 DIAGNOSIS — G301 Alzheimer's disease with late onset: Secondary | ICD-10-CM | POA: Diagnosis not present

## 2020-02-13 LAB — BASIC METABOLIC PANEL
Anion gap: 9 (ref 5–15)
BUN: 16 mg/dL (ref 8–23)
CO2: 25 mmol/L (ref 22–32)
Calcium: 9.3 mg/dL (ref 8.9–10.3)
Chloride: 101 mmol/L (ref 98–111)
Creatinine, Ser: 0.66 mg/dL (ref 0.44–1.00)
GFR, Estimated: >60 mL/min (ref >60)
Glucose, Bld: 174 mg/dL — ABNORMAL HIGH (ref 70–99)
Potassium: 4 mmol/L (ref 3.5–5.1)
Sodium: 135 mmol/L (ref 135–145)

## 2020-02-13 LAB — MAGNESIUM: Magnesium: 1.9 mg/dL (ref 1.7–2.4)

## 2020-02-13 LAB — GLUCOSE, CAPILLARY
Glucose-Capillary: 164 mg/dL — ABNORMAL HIGH (ref 70–99)
Glucose-Capillary: 196 mg/dL — ABNORMAL HIGH (ref 70–99)
Glucose-Capillary: 164 mg/dL — ABNORMAL HIGH (ref 70–99)
Glucose-Capillary: 109 mg/dL — ABNORMAL HIGH (ref 70–99)

## 2020-02-13 NOTE — TOC Progression Note (Signed)
Transition of Care [facility]) - Progression Note    Patient Details  Name: Theresa Hunt MRN: [id number] Date of Birth: Mar 17, 1929  [facility] CM/SW Contact  Ihor Gully, LCSW Phone Number: 02/13/2020, 12:50 PM  Clinical Narrative:    Josem Kaufmann received per Ebony Hail at [facility]. Patient can d/c 12/1 if she does not require PRN Ativan/    Expected Discharge Plan: [place] Barriers to Discharge: Continued Medical Work up  Expected Discharge Plan and Services Expected Discharge Plan: [place]                                               Social Determinants of Health (SDOH) Interventions    Readmission Risk Interventions No flowsheet data found.

## 2020-02-13 NOTE — Progress Notes (Signed)
Physical Therapy Note  Patient Details  Name: Theresa Hunt MRN: [id number] Date of Birth: 19-Apr-1929 Today's Date: 02/13/2020    Pt declined participation with PT today.  Pt sitting in chair with call bell within reach and daughter present in room.  Ihor Austin, LPTA/CLT; CBIS [phone]  [name] 02/13/2020, 10:37 AM

## 2020-02-13 NOTE — Progress Notes (Signed)
PROGRESS NOTE    Theresa Hunt  VOZ:[id number] DOB: 1929-10-28 DOA: 02/08/2020 PCP: [name], MD   Chief Complaint  Patient presents with  . Weakness    Brief Narrative:  As per H&P written by Dr. Denton Brick on 02/08/2020 Theresa Hunt  is a 86 y.o. female with past medical history relevant for of dementia, Interstitial cystitis/ s/p Prior Cystectomy with Uretero-ileaconduity, HLD, HTN, history of paroxysmal SVT, and diabetes mellitus presenting to the ED via EMS with fevers up to 102.3 after being found on the floor home -- History is limited due to underlying dementia -It is unclear how long patient has been on the floor, patient was somewhat disoriented and confused, however no obvious injuries noted -Patient repeatedly denies any pain -Work-up in the ED reveals chest x-ray showed without acute findings -Troponin of 21 -Initial lactic acid was 1.8, repeat lactic acid was 2.8--patient received IV fluid boluses -Total CK only 29 -Chemistry with a sodium of 131 glucose is 253 creatinine is 1.08 with a BUN of 29, LFTs are not elevated -Remarkable for white count of 15.1 and platelets of 140 hemoglobin is 14 -From urostomy bag suggestive of UTI--- -EDP obtain urine culture and blood cultures and give IV cefepime vancomycin and Flagyl.  Assessment & Plan: 1-E coli sepsis: Present on admission -In the setting of E. coli UTI and bacteremia.  -Continue to maintain adequate oral hydration -Continue as needed antipyretics and antiemetics. -Continue oral antibiotics to complete therapy. -Patient is afebrile, with normal WBCs -Will continue supportive care  2-Interstitial cystitis/ s/p Prior Cystectomy with Uretero-ileaconduit -Urostomy bag in place -No signs of hematuria -Continue treatment for UTI as mentioned above.  3-Dementia  -Continue supportive care and constant reorientation -Resume the use of Namenda -As needed benzodiazepines dose has been adjusted to minimize overt  sedation.  4-HTN (hypertension) -Continue metoprolol -Blood pressure stable.  5-H/o Paroxysmal SVT (supraventricular tachycardia)  -Continue metoprolol, continue IV fluids and treatment for sepsis -Metoprolol dose has been adjusted for better control of her heart rate. -Continue telemetry monitoring. -Despite episode of SVT patient is completely asymptomatic.  6-type 2 diabetes mellitus -Holding oral hypoglycemic agents while inpatient -Continue the use of sliding scale insulin CBG (last 3)  Recent Labs    02/13/20 0838 02/13/20 1156 02/13/20 1643  GLUCAP 164* 196* 164*    7-hyponatremia/hypokalemia/dehydration -Continue IV fluid, will adjust rate -Continue to follow-up electrolites trend and further replete as needed.. -sodium is stable; continue to follow -Will continue potassium repletion and follow electrolytes.  8-weakness and fall at home -No acute fracture or obvious injuries appreciated -Physical therapy has seen patient and recommended skilled nursing facility at discharge. -Family in agreement to pursue rehabilitation placement.  9-stage II left buttock pressure injury -Continue constant repositioning -Continue preventive measures -Present on admission; no signs of superimposed infection. Pressure Injury 02/09/20 Buttocks Left Stage 2 -  Partial thickness loss of dermis presenting as a shallow open injury with a red, pink wound bed without slough. opening to left inner buttocks. area with pink center. dry peri wound w purple surrounding t (Active)  02/09/20 1621  Location: Buttocks  Location Orientation: Left  Staging: Stage 2 -  Partial thickness loss of dermis presenting as a shallow open injury with a red, pink wound bed without slough.  Wound Description (Comments): opening to left inner buttocks. area with pink center. dry peri wound w purple surrounding tissue  Present on Admission: Yes    DVT prophylaxis: Heparin Code Status: Full code Family  Communication: Son at bedside. Disposition:   Status is: Inpatient  Dispo: The patient is from: Home              Anticipated d/c is to: SNF               Anticipated d/c date is: 02/14/20              Patient currently medically stable for discharge; she has remained afebrile, no nausea, no vomiting and denying dysuria.  Has completed IV antibiotics for her bacteremia/UTI and is ready to complete therapy orally at this point.  Needs a skilled nursing facility for rehabilitation and further care.       Consultants:   None   Procedures:  See below for x-ray reports.  Antimicrobials:  Cefepime>>11/26>>11/29 Vancomycin  02/08/20>>>>02/09/20 Rocephin 11/29>>11/30 Cefdinir 11/30  Subjective: No dysuria, no chest pain, no nausea, no vomiting.  Patient is afebrile.  SVT appreciated on telemetry.  In no acute distress.  Good urine output reported.  Objective: Vitals:   02/12/20 1630 02/12/20 2002 02/13/20 0500 02/13/20 1514  BP: 137/86 (!) 191/87 (!) 158/69 (!) 148/61  Pulse: (!) 124 90 (!) 120 71  Resp: 20 20 16 17   Temp: 98.2 F (36.8 C) 98.4 F (36.9 C) 98.7 F (37.1 C) 98.3 F (36.8 C)  TempSrc:  Oral    SpO2: 94% 96% 98% 97%  Weight:   64.2 kg   Height:        Intake/Output Summary (Last 24 hours) at 02/13/2020 1820 Last data filed at 02/13/2020 1424 Gross per 24 hour  Intake 1200 ml  Output 2950 ml  Net -1750 ml   Filed Weights   02/10/20 0150 02/11/20 0414 02/13/20 0500  Weight: 66.2 kg 67.9 kg 64.2 kg    Examination: General exam: awake, alert, NAD, cooperative.  Respiratory system: sitting up in chair, BBS CTA Cardiovascular system: normal s1, s2 sounds.    Gastrointestinal system: nondistended, nontender, no masses.  Central nervous system: Alert and oriented. No focal neurological deficits. Extremities: No cyanosis or clubbing. Skin: no gross lesions. Stage 2 sacral decub Psychiatry: Mood & affect appropriate.   Data Reviewed: I have personally  reviewed following labs and imaging studies  CBC: Recent Labs  Lab 02/08/20 1718 02/09/20 0554 02/10/20 0710  WBC 15.1* 12.3* 7.3  NEUTROABS 13.6*  --   --   HGB 14.0 12.6 12.1  HCT 42.1 38.3 36.3  MCV 91.1 91.4 91.2  PLT 140* 106* 116*    Basic Metabolic Panel: Recent Labs  Lab 02/08/20 1718 02/09/20 0554 02/10/20 0710 02/11/20 0719 02/13/20 0556  NA 131* 135 136 137 135  K 3.5 2.9* 3.3* 2.8* 4.0  CL 98 104 103 103 101  CO2 23 24 24 25 25   GLUCOSE 253* 181* 133* 85 174*  BUN 29* 24* 20 20 16   CREATININE 1.08* 0.85 0.67 0.72 0.66  CALCIUM 9.7 9.2 9.3 9.0 9.3  MG  --   --   --  1.7 1.9    GFR: Estimated Creatinine Clearance: 43.7 mL/min (by C-G formula based on SCr of 0.66 mg/dL).  Liver Function Tests: Recent Labs  Lab 02/08/20 1718  AST 35  ALT 25  ALKPHOS 106  BILITOT 0.7  PROT 7.2  ALBUMIN 3.8    CBG: Recent Labs  Lab 02/12/20 2001 02/12/20 2352 02/13/20 0838 02/13/20 1156 02/13/20 1643  GLUCAP 185* 184* 164* 196* 164*     Recent Results (from the past 240 hour(s))  Urine culture     Status: Abnormal   Collection Time: 02/08/20  5:22 PM   Specimen: In/Out Cath Urine  Result Value Ref Range Status   Specimen Description   Final    IN/OUT CATH URINE Performed at [facility], [address] [place], [place] [address]    Special Requests   Final    NONE Performed at [facility], [address], [place], [place] [address]    Culture MULTIPLE SPECIES PRESENT, SUGGEST RECOLLECTION (A)  Final   Report Status 02/10/2020 FINAL  Final  Resp Panel by RT-PCR (Flu A&B, Covid) Nasopharyngeal Swab     Status: None   Collection Time: 02/08/20  5:24 PM   Specimen: Nasopharyngeal Swab; Nasopharyngeal(NP) swabs in vial transport medium  Result Value Ref Range Status   SARS Coronavirus 2 by RT PCR NEGATIVE NEGATIVE Final    Comment: (NOTE) SARS-CoV-2 target nucleic acids are NOT DETECTED.  The SARS-CoV-2 RNA is generally detectable in upper  respiratory specimens during the acute phase of infection. The lowest concentration of SARS-CoV-2 viral copies this assay can detect is 138 copies/mL. A negative result does not preclude SARS-Cov-2 infection and should not be used as the sole basis for treatment or other patient management decisions. A negative result may occur with  improper specimen collection/handling, submission of specimen other than nasopharyngeal swab, presence of viral mutation(s) within the areas targeted by this assay, and inadequate number of viral copies(<138 copies/mL). A negative result must be combined with clinical observations, patient history, and epidemiological information. The expected result is Negative.  Fact Sheet for Patients:  EntrepreneurPulse.com.au  Fact Sheet for Healthcare Providers:  IncredibleEmployment.be  This test is no t yet approved or cleared by the [place] FDA and  has been authorized for detection and/or diagnosis of SARS-CoV-2 by FDA under an Emergency Use Authorization (EUA). This EUA will remain  in effect (meaning this test can be used) for the duration of the COVID-19 declaration under Section 564(b)(1) of the Act, 21 U.S.C.section 360bbb-3(b)(1), unless the authorization is terminated  or revoked sooner.       Influenza A by PCR NEGATIVE NEGATIVE Final   Influenza B by PCR NEGATIVE NEGATIVE Final    Comment: (NOTE) The Xpert Xpress SARS-CoV-2/FLU/RSV plus assay is intended as an aid in the diagnosis of influenza from Nasopharyngeal swab specimens and should not be used as a sole basis for treatment. Nasal washings and aspirates are unacceptable for Xpert Xpress SARS-CoV-2/FLU/RSV testing.  Fact Sheet for Patients: EntrepreneurPulse.com.au  Fact Sheet for Healthcare Providers: IncredibleEmployment.be  This test is not yet approved or cleared by the [place] FDA and has been  authorized for detection and/or diagnosis of SARS-CoV-2 by FDA under an Emergency Use Authorization (EUA). This EUA will remain in effect (meaning this test can be used) for the duration of the COVID-19 declaration under Section 564(b)(1) of the Act, 21 U.S.C. section 360bbb-3(b)(1), unless the authorization is terminated or revoked.  Performed at [facility], [address] [place], [place] [address]   Blood Culture (routine x 2)     Status: Abnormal   Collection Time: 02/08/20  5:35 PM   Specimen: BLOOD  Result Value Ref Range Status   Specimen Description   Final    BLOOD RIGHT ANTECUBITAL Performed at [facility], [address] [place], [place] [address]    Special Requests   Final    BOTTLES DRAWN AEROBIC AND ANAEROBIC Blood Culture adequate volume Performed at [facility], [address], [place], [place] [address]    Culture  Setup Time   Final    GRAM NEGATIVE RODS IN BOTH AEROBIC AND ANAEROBIC BOTTLES Gram Stain Report Called to,Read Back By and Verified With: CRAWFORD H. @ 2951 ON [id number] BY HENDERSONL. CRITICAL RESULT CALLED TO, READ BACK BY AND VERIFIED WITH: RN Cheryll Dessert [id number] MLM Performed at [facility], [address], [place], [place] [address]    Culture ESCHERICHIA COLI (A)  Final   Report Status 02/11/2020 FINAL  Final   Organism ID, Bacteria ESCHERICHIA COLI  Final      Susceptibility   Escherichia coli - MIC*    AMPICILLIN >=32 RESISTANT Resistant     CEFAZOLIN <=4 SENSITIVE Sensitive     CEFEPIME <=0.12 SENSITIVE Sensitive     CEFTAZIDIME <=1 SENSITIVE Sensitive     CEFTRIAXONE <=0.25 SENSITIVE Sensitive     CIPROFLOXACIN <=0.25 SENSITIVE Sensitive     GENTAMICIN <=1 SENSITIVE Sensitive     IMIPENEM <=0.25 SENSITIVE Sensitive     TRIMETH/SULFA >=320 RESISTANT Resistant     AMPICILLIN/SULBACTAM >=32 RESISTANT Resistant     PIP/TAZO <=4 SENSITIVE Sensitive     * ESCHERICHIA COLI  Blood Culture ID Panel (Reflexed)     Status: Abnormal    Collection Time: 02/08/20  5:35 PM  Result Value Ref Range Status   Enterococcus faecalis NOT DETECTED NOT DETECTED Final   Enterococcus Faecium NOT DETECTED NOT DETECTED Final   Listeria monocytogenes NOT DETECTED NOT DETECTED Final   Staphylococcus species NOT DETECTED NOT DETECTED Final   Staphylococcus aureus (BCID) NOT DETECTED NOT DETECTED Final   Staphylococcus epidermidis NOT DETECTED NOT DETECTED Final   Staphylococcus lugdunensis NOT DETECTED NOT DETECTED Final   Streptococcus species NOT DETECTED NOT DETECTED Final   Streptococcus agalactiae NOT DETECTED NOT DETECTED Final   Streptococcus pneumoniae NOT DETECTED NOT DETECTED Final   Streptococcus pyogenes NOT DETECTED NOT DETECTED Final   A.calcoaceticus-baumannii NOT DETECTED NOT DETECTED Final   Bacteroides fragilis NOT DETECTED NOT DETECTED Final   Enterobacterales DETECTED (A) NOT DETECTED Final    Comment: Enterobacterales represent a large order of gram negative bacteria, not a single organism. CRITICAL RESULT CALLED TO, READ BACK BY AND VERIFIED WITH: RN M DOSS [id number] MLM    Enterobacter cloacae complex NOT DETECTED NOT DETECTED Final   Escherichia coli DETECTED (A) NOT DETECTED Final    Comment: CRITICAL RESULT CALLED TO, READ BACK BY AND VERIFIED WITH: RN M DOSS [id number] MLM    Klebsiella aerogenes NOT DETECTED NOT DETECTED Final   Klebsiella oxytoca NOT DETECTED NOT DETECTED Final   Klebsiella pneumoniae NOT DETECTED NOT DETECTED Final   Proteus species NOT DETECTED NOT DETECTED Final   Salmonella species NOT DETECTED NOT DETECTED Final   Serratia marcescens NOT DETECTED NOT DETECTED Final   Haemophilus influenzae NOT DETECTED NOT DETECTED Final   Neisseria meningitidis NOT DETECTED NOT DETECTED Final   Pseudomonas aeruginosa NOT DETECTED NOT DETECTED Final   Stenotrophomonas maltophilia NOT DETECTED NOT DETECTED Final   Candida albicans NOT DETECTED NOT DETECTED Final   Candida auris NOT DETECTED NOT  DETECTED Final   Candida glabrata NOT DETECTED NOT DETECTED Final   Candida krusei NOT DETECTED NOT DETECTED Final   Candida parapsilosis NOT DETECTED NOT DETECTED Final   Candida tropicalis NOT DETECTED NOT DETECTED Final   Cryptococcus neoformans/gattii NOT DETECTED NOT DETECTED Final   CTX-M ESBL NOT DETECTED NOT DETECTED Final   Carbapenem resistance IMP  NOT DETECTED NOT DETECTED Final   Carbapenem resistance KPC NOT DETECTED NOT DETECTED Final   Carbapenem resistance NDM NOT DETECTED NOT DETECTED Final   Carbapenem resist OXA 48 LIKE NOT DETECTED NOT DETECTED Final   Carbapenem resistance VIM NOT DETECTED NOT DETECTED Final    Comment: Performed at [facility], [place] [address], [place], [place] [address]  Blood Culture (routine x 2)     Status: Abnormal   Collection Time: 02/08/20  5:36 PM   Specimen: BLOOD RIGHT ARM  Result Value Ref Range Status   Specimen Description   Final    BLOOD RIGHT ARM Performed at [facility], [address] [place], [place] [address]    Special Requests   Final    BOTTLES DRAWN AEROBIC ONLY Blood Culture adequate volume Performed at [facility], [address] [place], [place] [address]    Culture  Setup Time   Final    GRAM NEGATIVE RODS AEROBIC BOTTLE ONLY Gram Stain Report Called to,Read Back By and Verified With: CRAWFORD @ 8811 ON [id number] BY HENDERSON L. CRITICAL VALUE NOTED.  VALUE IS CONSISTENT WITH PREVIOUSLY REPORTED AND CALLED VALUE.    Culture (A)  Final    ESCHERICHIA COLI SUSCEPTIBILITIES PERFORMED ON PREVIOUS CULTURE WITHIN THE LAST 5 DAYS. Performed at [facility], [place] [address], [place], [place] [address]    Report Status 02/11/2020 FINAL  Final     Radiology Studies: No results found. Scheduled Meds: . ascorbic acid  500 mg Oral Daily  . aspirin EC  81 mg Oral Daily  . cefdinir  300 mg Oral Q12H  . cholecalciferol  2,000 Units Oral Daily  . heparin  5,000 Units Subcutaneous Q8H  . insulin aspart  0-5  Units Subcutaneous QHS  . insulin aspart  0-6 Units Subcutaneous TID WC  . loratadine  5 mg Oral Daily  . memantine  5 mg Oral Daily  . metoprolol succinate  75 mg Oral TID  . polyethylene glycol  17 g Oral Daily  . sodium chloride flush  3 mL Intravenous Q12H  . traZODone  50 mg Oral QHS   Continuous Infusions: . sodium chloride    . lactated ringers 50 mL/hr at 02/13/20 1343     LOS: 5 days    Time spent: 30 minutes  Irwin Brakeman, MD [facility]   To contact the attending provider between 7A-7P or the covering provider during after hours 7P-7A, please log into the web site [email or web address] and access using universal [place] password for that web site. If you do not have the password, please call the hospital operator.  02/13/2020, 6:20 PM

## 2020-02-13 NOTE — Care Management Important Message (Signed)
Important Message  Patient Details  Name: Theresa Hunt MRN: [id number] Date of Birth: 1929/05/10   Medicare Important Message Given:  Yes     Tommy Medal 02/13/2020, 2:20 PM

## 2020-02-14 DIAGNOSIS — N3010 Interstitial cystitis (chronic) without hematuria: Secondary | ICD-10-CM | POA: Diagnosis not present

## 2020-02-14 DIAGNOSIS — A419 Sepsis, unspecified organism: Secondary | ICD-10-CM | POA: Diagnosis not present

## 2020-02-14 DIAGNOSIS — G301 Alzheimer's disease with late onset: Secondary | ICD-10-CM | POA: Diagnosis not present

## 2020-02-14 DIAGNOSIS — I10 Essential (primary) hypertension: Secondary | ICD-10-CM | POA: Diagnosis not present

## 2020-02-14 LAB — GLUCOSE, CAPILLARY
Glucose-Capillary: 122 mg/dL — ABNORMAL HIGH (ref 70–99)
Glucose-Capillary: 199 mg/dL — ABNORMAL HIGH (ref 70–99)
Glucose-Capillary: 142 mg/dL — ABNORMAL HIGH (ref 70–99)
Glucose-Capillary: 155 mg/dL — ABNORMAL HIGH (ref 70–99)

## 2020-02-14 LAB — RESP PANEL BY RT-PCR (FLU A&B, COVID) ARPGX2
Influenza A by PCR: NEGATIVE
Influenza B by PCR: NEGATIVE
SARS Coronavirus 2 by RT PCR: NEGATIVE

## 2020-02-14 MED ORDER — METOPROLOL SUCCINATE ER 25 MG PO TB24: 75.0000 mg | ORAL_TABLET | Freq: Three times a day (TID) | ORAL | Status: AC

## 2020-02-14 MED ORDER — CEFDINIR 300 MG PO CAPS: 300.0000 mg | ORAL_CAPSULE | Freq: Two times a day (BID) | ORAL | 0 refills | Status: AC

## 2020-02-14 NOTE — Discharge Summary (Addendum)
Physician Discharge Summary  Theresa Hunt TKW:[id number] DOB: 1929-07-19 DOA: 02/08/2020  PCP: [name], MD  Admit date: 02/08/2020 Discharge date: 02/15/2020  Admitted From:  Home  Disposition: SNF   Recommendations for Outpatient Follow-up:  1. Follow up with PCP in 2-3 weeks 2. Please make cardiology follow up appointment in 3 weeks 3. Please monitor blood sugars per protocol  4. Please check BMP in 1 week to follow up electrolytes  Discharge Condition: STABLE   CODE STATUS: FULL    Brief Hospitalization Summary: Please see all hospital notes, images, labs for full details of the hospitalization. Brief Narrative:  As per H&P written by Dr. Denton Brick on 02/08/2020 Theresa Hunt a54 y.o.femalewith past medical history relevant forof dementia,Interstitial cystitis/ s/p Prior Cystectomy with Uretero-ileaconduity,HLD, HTN, history of paroxysmal SVT, and diabetes mellitus presenting to the ED via EMS with fevers up to 102.3after being found on the floor home  History is limited due to underlying dementia -It is unclear how long patient has been on the floor, patient was somewhat disoriented and confused, however no obvious injuries noted -Patient repeatedly denies any pain -Work-up in the ED reveals chest x-ray showed without acute findings -Troponin of 21 -Initial lactic acid was 1.8, repeat lactic acid was 2.8--patient received IV fluid boluses -Total CK only 29 -Chemistry with a sodium of 131 glucose is 253 creatinine is 1.08 with a BUN of 29, LFTs are not elevated -Remarkable for white count of 15.1 and platelets of 140 hemoglobin is 14 -From urostomy bag suggestive of UTI--- -EDP obtain urine culture and blood cultures and give IV cefepime vancomycin and Flagyl.  Assessment & Plan: 1-E coli sepsis: Present on admission, resolved now -In the setting of E. coli UTI and bacteremia.  -Continue to maintain adequate oral hydration -Continue as needed antipyretics and  antiemetics. -Continue oral cefepime x 7 more days to complete full course. -Patient is afebrile, with normal WBCs -Treated with supportive care  2-Interstitial cystitis/ s/p Prior Cystectomy with Uretero-ileaconduit -Urostomy bag in place -No signs of hematuria -Continue treatment for UTI as mentioned above.  3-Dementia  -Continue supportive care and constant reorientation -Resume the use of Namenda  4-HTN (hypertension) -Continue metoprolol -Blood pressure stable.  5-H/o Paroxysmal SVT (supraventricular tachycardia)  -Continue metoprolol, continue IV fluids and treatment for sepsis -Metoprolol dose has been adjusted for better control of her heart rate. -Continue telemetry monitoring. -Despite episode of SVT patient is completely asymptomatic.  6-type 2 diabetes mellitus -Holding oral hypoglycemic agents while inpatient -Continue the use of sliding scale insulin  7-hyponatremia/hypokalemia/dehydration -Treated with IV fluid, lytes have been repleted.  -sodium is stable; continue to follow   8-weakness and fall at home -No acute fracture or obvious injuries appreciated -Physical therapy has seen patient and recommended skilled nursing facility at discharge. -Family in agreement to pursue rehabilitation placement.  9-stage II left buttock pressure injury -Continue constant repositioning -Continue preventive measures -Present on admission; no signs of superimposed infection. Pressure Injury 02/09/20 Buttocks Left Stage 2 -  Partial thickness loss of dermis presenting as a shallow open injury with a red, pink wound bed without slough. opening to left inner buttocks. area with pink center. dry peri wound w purple surrounding t (Active)  02/09/20 1621  Location: Buttocks  Location Orientation: Left  Staging: Stage 2 -  Partial thickness loss of dermis presenting as a shallow open injury with a red, pink wound bed without slough.  Wound Description (Comments): opening  to left inner buttocks. area with pink center.  dry peri wound w purple surrounding tissue  Present on Admission: Yes   DVT prophylaxis: Heparin Code Status: Full code Family Communication: daughter at bedside. Disposition:  SNF  Status is: Inpatient  Dispo: The patient is from: Home  Anticipated d/c is to: SNF   Anticipated d/c date is: 02/14/20  Discharge Diagnoses:  Principal Problem:   Sepsis ([place]) Active Problems:   Interstitial cystitis/ s/p Prior Cystectomy with Uretero-ileaconduit   UTI (urinary tract infection)   Ileal conduit stomal stenosis, subsequent encounter   Dementia ([place])   HTN (hypertension)   H/o Paroxysmal SVT (supraventricular tachycardia) ([facility])   Pressure injury of skin  Discharge Instructions:  Allergies as of 02/15/2020      Reactions   Codeine Other (See Comments)   Pt. does not remember   Donepezil Other (See Comments)   hallucinations      Medication List    TAKE these medications   aspirin 81 MG tablet Take 81 mg by mouth daily.   cefdinir 300 MG capsule Commonly known as: OMNICEF Take 1 capsule (300 mg total) by mouth every 12 (twelve) hours for 7 days.   DuoDERM CGF Dressing Misc Apply 1 each topically every 3 (three) days. Size 2x2 will work better for area of skin   EQL Vitamin C 500 MG tablet Generic drug: ascorbic acid Take 500 mg by mouth daily.   Foam Cushion Therapeutic Ring Misc 1 each by Does not apply route as needed.   Loratadine 5 MG Tbdp Take 1 tablet by mouth daily.   memantine 10 MG tablet Commonly known as: NAMENDA Take 10 mg by mouth daily. Take 1/2 tablet by mouth daily What changed: Another medication with the same name was removed. Continue taking this medication, and follow the directions you see here.   metFORMIN 500 MG tablet Commonly known as: GLUCOPHAGE TAKE ONE (1) TABLET EACH DAY What changed: See the new instructions.   metoprolol succinate 25 MG 24 hr  tablet Commonly known as: TOPROL-XL Take 3 tablets (75 mg total) by mouth in the morning, at noon, and at bedtime. Take with or immediately following a meal. What changed:   medication strength  See the new instructions.   Vitamin D3 50 MCG (2000 UT) capsule Take 2,000 Units by mouth daily.       Contact information for after-discharge care    [facility] .   Service: Skilled Nursing Contact information: [address] [address] [phone]                 Allergies  Allergen Reactions  . Codeine Other (See Comments)    Pt. does not remember  . Donepezil Other (See Comments)    hallucinations   Allergies as of 02/15/2020      Reactions   Codeine Other (See Comments)   Pt. does not remember   Donepezil Other (See Comments)   hallucinations      Medication List    TAKE these medications   aspirin 81 MG tablet Take 81 mg by mouth daily.   cefdinir 300 MG capsule Commonly known as: OMNICEF Take 1 capsule (300 mg total) by mouth every 12 (twelve) hours for 7 days.   DuoDERM CGF Dressing Misc Apply 1 each topically every 3 (three) days. Size 2x2 will work better for area of skin   EQL Vitamin C 500 MG tablet Generic drug: ascorbic acid Take 500 mg by mouth daily.  Foam Cushion Therapeutic Ring Misc 1 each by Does not apply route as needed.   Loratadine 5 MG Tbdp Take 1 tablet by mouth daily.   memantine 10 MG tablet Commonly known as: NAMENDA Take 10 mg by mouth daily. Take 1/2 tablet by mouth daily What changed: Another medication with the same name was removed. Continue taking this medication, and follow the directions you see here.   metFORMIN 500 MG tablet Commonly known as: GLUCOPHAGE TAKE ONE (1) TABLET EACH DAY What changed: See the new instructions.   metoprolol succinate 25 MG 24 hr tablet Commonly known as: TOPROL-XL Take 3 tablets (75 mg total) by mouth in the morning,  at noon, and at bedtime. Take with or immediately following a meal. What changed:   medication strength  See the new instructions.   Vitamin D3 50 MCG (2000 UT) capsule Take 2,000 Units by mouth daily.       Procedures/Studies: DG Chest Port 1 View  Result Date: 02/08/2020 CLINICAL DATA:  Possible sepsis found on floor EXAM: PORTABLE CHEST 1 VIEW COMPARISON:  Report 05/25/2012 FINDINGS: No consolidation or effusion. Mild cardiomegaly with aortic atherosclerosis. Diffuse coarse interstitial opacity likely chronic bronchitic change. Ovoid opacity in the right hilus likely due to hilar vessel. No pneumothorax IMPRESSION: No active cardiopulmonary disease. Mild cardiomegaly with probable chronic bronchitic changes. Ovoid opacity in the right hilus suspected to be secondary to hilar vessel. Electronically Signed   By: Donavan Foil M.D.   On: 02/08/2020 18:00     Subjective: Pt sitting up in chair, she is in good spirits and she wants to go to rehab.    Discharge Exam: Vitals:   02/14/20 2132 02/15/20 0406  BP: 111/68 (!) 122/58  Pulse: (!) 110 62  Resp: 18 18  Temp: 98.4 F (36.9 C) 98.5 F (36.9 C)  SpO2: 97% 96%   Vitals:   02/14/20 1418 02/14/20 2132 02/15/20 0406 02/15/20 0500  BP: 124/68 111/68 (!) 122/58   Pulse: 100 (!) 110 62   Resp: 18 18 18    Temp: 98.1 F (36.7 C) 98.4 F (36.9 C) 98.5 F (36.9 C)   TempSrc: Oral     SpO2: 93% 97% 96%   Weight:    64.3 kg  Height:       General: Pt is alert, awake, not in acute distress Cardiovascular: normal S1/S2 +, no rubs, no gallops Respiratory: CTA bilaterally, no wheezing, no rhonchi Abdominal: Soft, NT, ND, bowel sounds + Extremities: no edema, no cyanosis   The results of significant diagnostics from this hospitalization (including imaging, microbiology, ancillary and laboratory) are listed below for reference.     Microbiology: Recent Results (from the past 240 hour(s))  Urine culture     Status: Abnormal    Collection Time: 02/08/20  5:22 PM   Specimen: In/Out Cath Urine  Result Value Ref Range Status   Specimen Description   Final    IN/OUT CATH URINE Performed at [facility], [address] [place], [place] [address]    Special Requests   Final    NONE Performed at [facility], [address], [place], [place] [address]    Culture MULTIPLE SPECIES PRESENT, SUGGEST RECOLLECTION (A)  Final   Report Status 02/10/2020 FINAL  Final  Resp Panel by RT-PCR (Flu A&B, Covid) Nasopharyngeal Swab     Status: None   Collection Time: 02/08/20  5:24 PM   Specimen: Nasopharyngeal Swab; Nasopharyngeal(NP) swabs in vial transport medium  Result Value Ref Range  Status   SARS Coronavirus 2 by RT PCR NEGATIVE NEGATIVE Final    Comment: (NOTE) SARS-CoV-2 target nucleic acids are NOT DETECTED.  The SARS-CoV-2 RNA is generally detectable in upper respiratory specimens during the acute phase of infection. The lowest concentration of SARS-CoV-2 viral copies this assay can detect is 138 copies/mL. A negative result does not preclude SARS-Cov-2 infection and should not be used as the sole basis for treatment or other patient management decisions. A negative result may occur with  improper specimen collection/handling, submission of specimen other than nasopharyngeal swab, presence of viral mutation(s) within the areas targeted by this assay, and inadequate number of viral copies(<138 copies/mL). A negative result must be combined with clinical observations, patient history, and epidemiological information. The expected result is Negative.  Fact Sheet for Patients:  EntrepreneurPulse.com.au  Fact Sheet for Healthcare Providers:  IncredibleEmployment.be  This test is no t yet approved or cleared by the [place] FDA and  has been authorized for detection and/or diagnosis of SARS-CoV-2 by FDA under an Emergency Use Authorization (EUA). This EUA will remain  in  effect (meaning this test can be used) for the duration of the COVID-19 declaration under Section 564(b)(1) of the Act, 21 U.S.C.section 360bbb-3(b)(1), unless the authorization is terminated  or revoked sooner.       Influenza A by PCR NEGATIVE NEGATIVE Final   Influenza B by PCR NEGATIVE NEGATIVE Final    Comment: (NOTE) The Xpert Xpress SARS-CoV-2/FLU/RSV plus assay is intended as an aid in the diagnosis of influenza from Nasopharyngeal swab specimens and should not be used as a sole basis for treatment. Nasal washings and aspirates are unacceptable for Xpert Xpress SARS-CoV-2/FLU/RSV testing.  Fact Sheet for Patients: EntrepreneurPulse.com.au  Fact Sheet for Healthcare Providers: IncredibleEmployment.be  This test is not yet approved or cleared by the [place] FDA and has been authorized for detection and/or diagnosis of SARS-CoV-2 by FDA under an Emergency Use Authorization (EUA). This EUA will remain in effect (meaning this test can be used) for the duration of the COVID-19 declaration under Section 564(b)(1) of the Act, 21 U.S.C. section 360bbb-3(b)(1), unless the authorization is terminated or revoked.  Performed at [facility], [address] [place], [place] [address]   Blood Culture (routine x 2)     Status: Abnormal   Collection Time: 02/08/20  5:35 PM   Specimen: BLOOD  Result Value Ref Range Status   Specimen Description   Final    BLOOD RIGHT ANTECUBITAL Performed at [facility], [address], [place], [place] [address]    Special Requests   Final    BOTTLES DRAWN AEROBIC AND ANAEROBIC Blood Culture adequate volume Performed at [facility], [address], [place], [place] [address]    Culture  Setup Time   Final    GRAM NEGATIVE RODS IN BOTH AEROBIC AND ANAEROBIC BOTTLES Gram Stain Report Called to,Read Back By and Verified With: CRAWFORD H. @ 0815 ON [id number] BY HENDERSONL. CRITICAL RESULT CALLED TO, READ BACK BY  AND VERIFIED WITH: RN Cheryll Dessert [phone] MLM Performed at [facility], [address] [place], [place] [address]    Culture ESCHERICHIA COLI (A)  Final   Report Status 02/11/2020 FINAL  Final   Organism ID, Bacteria ESCHERICHIA COLI  Final      Susceptibility   Escherichia coli - MIC*    AMPICILLIN >=32 RESISTANT Resistant     CEFAZOLIN <=4 SENSITIVE Sensitive     CEFEPIME <=0.12 SENSITIVE Sensitive  CEFTAZIDIME <=1 SENSITIVE Sensitive     CEFTRIAXONE <=0.25 SENSITIVE Sensitive     CIPROFLOXACIN <=0.25 SENSITIVE Sensitive     GENTAMICIN <=1 SENSITIVE Sensitive     IMIPENEM <=0.25 SENSITIVE Sensitive     TRIMETH/SULFA >=320 RESISTANT Resistant     AMPICILLIN/SULBACTAM >=32 RESISTANT Resistant     PIP/TAZO <=4 SENSITIVE Sensitive     * ESCHERICHIA COLI  Blood Culture ID Panel (Reflexed)     Status: Abnormal   Collection Time: 02/08/20  5:35 PM  Result Value Ref Range Status   Enterococcus faecalis NOT DETECTED NOT DETECTED Final   Enterococcus Faecium NOT DETECTED NOT DETECTED Final   Listeria monocytogenes NOT DETECTED NOT DETECTED Final   Staphylococcus species NOT DETECTED NOT DETECTED Final   Staphylococcus aureus (BCID) NOT DETECTED NOT DETECTED Final   Staphylococcus epidermidis NOT DETECTED NOT DETECTED Final   Staphylococcus lugdunensis NOT DETECTED NOT DETECTED Final   Streptococcus species NOT DETECTED NOT DETECTED Final   Streptococcus agalactiae NOT DETECTED NOT DETECTED Final   Streptococcus pneumoniae NOT DETECTED NOT DETECTED Final   Streptococcus pyogenes NOT DETECTED NOT DETECTED Final   A.calcoaceticus-baumannii NOT DETECTED NOT DETECTED Final   Bacteroides fragilis NOT DETECTED NOT DETECTED Final   Enterobacterales DETECTED (A) NOT DETECTED Final    Comment: Enterobacterales represent a large order of gram negative bacteria, not a single organism. CRITICAL RESULT CALLED TO, READ BACK BY AND VERIFIED WITH: RN M DOSS [phone] MLM    Enterobacter  cloacae complex NOT DETECTED NOT DETECTED Final   Escherichia coli DETECTED (A) NOT DETECTED Final    Comment: CRITICAL RESULT CALLED TO, READ BACK BY AND VERIFIED WITH: RN M DOSS [phone] MLM    Klebsiella aerogenes NOT DETECTED NOT DETECTED Final   Klebsiella oxytoca NOT DETECTED NOT DETECTED Final   Klebsiella pneumoniae NOT DETECTED NOT DETECTED Final   Proteus species NOT DETECTED NOT DETECTED Final   Salmonella species NOT DETECTED NOT DETECTED Final   Serratia marcescens NOT DETECTED NOT DETECTED Final   Haemophilus influenzae NOT DETECTED NOT DETECTED Final   Neisseria meningitidis NOT DETECTED NOT DETECTED Final   Pseudomonas aeruginosa NOT DETECTED NOT DETECTED Final   Stenotrophomonas maltophilia NOT DETECTED NOT DETECTED Final   Candida albicans NOT DETECTED NOT DETECTED Final   Candida auris NOT DETECTED NOT DETECTED Final   Candida glabrata NOT DETECTED NOT DETECTED Final   Candida krusei NOT DETECTED NOT DETECTED Final   Candida parapsilosis NOT DETECTED NOT DETECTED Final   Candida tropicalis NOT DETECTED NOT DETECTED Final   Cryptococcus neoformans/gattii NOT DETECTED NOT DETECTED Final   CTX-M ESBL NOT DETECTED NOT DETECTED Final   Carbapenem resistance IMP NOT DETECTED NOT DETECTED Final   Carbapenem resistance KPC NOT DETECTED NOT DETECTED Final   Carbapenem resistance NDM NOT DETECTED NOT DETECTED Final   Carbapenem resist OXA 48 LIKE NOT DETECTED NOT DETECTED Final   Carbapenem resistance VIM NOT DETECTED NOT DETECTED Final    Comment: Performed at [facility], [address] [place], [place] [address]  Blood Culture (routine x 2)     Status: Abnormal   Collection Time: 02/08/20  5:36 PM   Specimen: BLOOD RIGHT ARM  Result Value Ref Range Status   Specimen Description   Final    BLOOD RIGHT ARM Performed at [facility], [address], [place], [place] [address]    Special Requests   Final    BOTTLES DRAWN AEROBIC ONLY Blood Culture adequate  volume  Performed at [facility], [address], [place], [place] [address]    Culture  Setup Time   Final    GRAM NEGATIVE RODS AEROBIC BOTTLE ONLY Gram Stain Report Called to,Read Back By and Verified With: CRAWFORD @ 4034 ON [id number] BY HENDERSON L. CRITICAL VALUE NOTED.  VALUE IS CONSISTENT WITH PREVIOUSLY REPORTED AND CALLED VALUE.    Culture (A)  Final    ESCHERICHIA COLI SUSCEPTIBILITIES PERFORMED ON PREVIOUS CULTURE WITHIN THE LAST 5 DAYS. Performed at [facility], [place] [address], [place], [place] [address]    Report Status 02/11/2020 FINAL  Final  Resp Panel by RT-PCR (Flu A&B, Covid) Nasopharyngeal Swab     Status: None   Collection Time: 02/14/20  3:20 PM   Specimen: Nasopharyngeal Swab; Nasopharyngeal(NP) swabs in vial transport medium  Result Value Ref Range Status   SARS Coronavirus 2 by RT PCR NEGATIVE NEGATIVE Final    Comment: (NOTE) SARS-CoV-2 target nucleic acids are NOT DETECTED.  The SARS-CoV-2 RNA is generally detectable in upper respiratory specimens during the acute phase of infection. The lowest concentration of SARS-CoV-2 viral copies this assay can detect is 138 copies/mL. A negative result does not preclude SARS-Cov-2 infection and should not be used as the sole basis for treatment or other patient management decisions. A negative result may occur with  improper specimen collection/handling, submission of specimen other than nasopharyngeal swab, presence of viral mutation(s) within the areas targeted by this assay, and inadequate number of viral copies(<138 copies/mL). A negative result must be combined with clinical observations, patient history, and epidemiological information. The expected result is Negative.  Fact Sheet for Patients:  EntrepreneurPulse.com.au  Fact Sheet for Healthcare Providers:  IncredibleEmployment.be  This test is no t yet approved or cleared by the [place] FDA and  has been  authorized for detection and/or diagnosis of SARS-CoV-2 by FDA under an Emergency Use Authorization (EUA). This EUA will remain  in effect (meaning this test can be used) for the duration of the COVID-19 declaration under Section 564(b)(1) of the Act, 21 U.S.C.section 360bbb-3(b)(1), unless the authorization is terminated  or revoked sooner.       Influenza A by PCR NEGATIVE NEGATIVE Final   Influenza B by PCR NEGATIVE NEGATIVE Final    Comment: (NOTE) The Xpert Xpress SARS-CoV-2/FLU/RSV plus assay is intended as an aid in the diagnosis of influenza from Nasopharyngeal swab specimens and should not be used as a sole basis for treatment. Nasal washings and aspirates are unacceptable for Xpert Xpress SARS-CoV-2/FLU/RSV testing.  Fact Sheet for Patients: EntrepreneurPulse.com.au  Fact Sheet for Healthcare Providers: IncredibleEmployment.be  This test is not yet approved or cleared by the [place] FDA and has been authorized for detection and/or diagnosis of SARS-CoV-2 by FDA under an Emergency Use Authorization (EUA). This EUA will remain in effect (meaning this test can be used) for the duration of the COVID-19 declaration under Section 564(b)(1) of the Act, 21 U.S.C. section 360bbb-3(b)(1), unless the authorization is terminated or revoked.  Performed at [facility], [address], [place],  [address]      Labs: BNP (last 3 results) No results for input(s): BNP in the last 8760 hours. Basic Metabolic Panel: Recent Labs  Lab 02/08/20 1718 02/09/20 0554 02/10/20 0710 02/11/20 0719 02/13/20 0556  NA 131* 135 136 137 135  K 3.5 2.9* 3.3* 2.8* 4.0  CL 98 104 103 103 101  CO2 23 24 24 25 25   GLUCOSE 253* 181* 133* 85 174*  BUN 29* 24* 20 20 16   CREATININE 1.08* 0.85 0.67 0.72 0.66  CALCIUM 9.7 9.2 9.3 9.0 9.3  MG  --   --   --  1.7 1.9   Liver Function Tests: Recent Labs  Lab 02/08/20 1718  AST 35  ALT 25  ALKPHOS  106  BILITOT 0.7  PROT 7.2  ALBUMIN 3.8   No results for input(s): LIPASE, AMYLASE in the last 168 hours. No results for input(s): AMMONIA in the last 168 hours. CBC: Recent Labs  Lab 02/08/20 1718 02/09/20 0554 02/10/20 0710  WBC 15.1* 12.3* 7.3  NEUTROABS 13.6*  --   --   HGB 14.0 12.6 12.1  HCT 42.1 38.3 36.3  MCV 91.1 91.4 91.2  PLT 140* 106* 116*   Cardiac Enzymes: Recent Labs  Lab 02/08/20 1718  CKTOTAL 29*   BNP: Invalid input(s): POCBNP CBG: Recent Labs  Lab 02/14/20 0752 02/14/20 1114 02/14/20 1609 02/14/20 2135 02/15/20 0742  GLUCAP 122* 199* 142* 155* 119*   D-Dimer No results for input(s): DDIMER in the last 72 hours. Hgb A1c No results for input(s): HGBA1C in the last 72 hours. Lipid Profile No results for input(s): CHOL, HDL, LDLCALC, TRIG, CHOLHDL, LDLDIRECT in the last 72 hours. Thyroid function studies No results for input(s): TSH, T4TOTAL, T3FREE, THYROIDAB in the last 72 hours.  Invalid input(s): FREET3 Anemia work up No results for input(s): VITAMINB12, FOLATE, FERRITIN, TIBC, IRON, RETICCTPCT in the last 72 hours. Urinalysis    Component Value Date/Time   COLORURINE YELLOW 02/08/2020 1722   APPEARANCEUR HAZY (A) 02/08/2020 1722   APPEARANCEUR Cloudy (A) 09/13/2018 1414   LABSPEC 1.009 02/08/2020 1722   PHURINE 7.0 02/08/2020 1722   GLUCOSEU 50 (A) 02/08/2020 1722   HGBUR LARGE (A) 02/08/2020 1722   BILIRUBINUR NEGATIVE 02/08/2020 1722   BILIRUBINUR Negative 09/13/2018 [place] 02/08/2020 1722   PROTEINUR 100 (A) 02/08/2020 1722   NITRITE POSITIVE (A) 02/08/2020 1722   LEUKOCYTESUR MODERATE (A) 02/08/2020 1722   Sepsis Labs Invalid input(s): PROCALCITONIN,  WBC,  LACTICIDVEN Microbiology Recent Results (from the past 240 hour(s))  Urine culture     Status: Abnormal   Collection Time: 02/08/20  5:22 PM   Specimen: In/Out Cath Urine  Result Value Ref Range Status   Specimen Description   Final    IN/OUT  CATH URINE Performed at [facility], [address], [place], [place] [address]    Special Requests   Final    NONE Performed at [facility], [address] [place], [place] [address]    [place], SUGGEST RECOLLECTION (A)  Final   Report Status 02/10/2020 FINAL  Final  Resp Panel by RT-PCR (Flu A&B, Covid) Nasopharyngeal Swab     Status: None   Collection Time: 02/08/20  5:24 PM   Specimen: Nasopharyngeal Swab; Nasopharyngeal(NP) swabs in vial transport medium  Result Value Ref Range Status   SARS Coronavirus 2 by RT PCR NEGATIVE NEGATIVE Final    Comment: (NOTE) SARS-CoV-2 target nucleic acids are NOT DETECTED.  The SARS-CoV-2 RNA is generally detectable in upper respiratory specimens during the acute phase of infection. The lowest concentration of SARS-CoV-2 viral copies this assay can detect is 138 copies/mL. A negative result does not preclude SARS-Cov-2 infection and should not be used as the sole basis for treatment or other patient management decisions. A negative result may occur with  improper specimen collection/handling, submission of specimen other than nasopharyngeal swab, presence of viral mutation(s)  within the areas targeted by this assay, and inadequate number of viral copies(<138 copies/mL). A negative result must be combined with clinical observations, patient history, and epidemiological information. The expected result is Negative.  Fact Sheet for Patients:  EntrepreneurPulse.com.au  Fact Sheet for Healthcare Providers:  IncredibleEmployment.be  This test is no t yet approved or cleared by the [place] FDA and  has been authorized for detection and/or diagnosis of SARS-CoV-2 by FDA under an Emergency Use Authorization (EUA). This EUA will remain  in effect (meaning this test can be used) for the duration of the COVID-19 declaration under Section 564(b)(1) of the Act, 21 U.S.C.section  360bbb-3(b)(1), unless the authorization is terminated  or revoked sooner.       Influenza A by PCR NEGATIVE NEGATIVE Final   Influenza B by PCR NEGATIVE NEGATIVE Final    Comment: (NOTE) The Xpert Xpress SARS-CoV-2/FLU/RSV plus assay is intended as an aid in the diagnosis of influenza from Nasopharyngeal swab specimens and should not be used as a sole basis for treatment. Nasal washings and aspirates are unacceptable for Xpert Xpress SARS-CoV-2/FLU/RSV testing.  Fact Sheet for Patients: EntrepreneurPulse.com.au  Fact Sheet for Healthcare Providers: IncredibleEmployment.be  This test is not yet approved or cleared by the [place] FDA and has been authorized for detection and/or diagnosis of SARS-CoV-2 by FDA under an Emergency Use Authorization (EUA). This EUA will remain in effect (meaning this test can be used) for the duration of the COVID-19 declaration under Section 564(b)(1) of the Act, 21 U.S.C. section 360bbb-3(b)(1), unless the authorization is terminated or revoked.  Performed at [facility], [address], [place], [place] [address]   Blood Culture (routine x 2)     Status: Abnormal   Collection Time: 02/08/20  5:35 PM   Specimen: BLOOD  Result Value Ref Range Status   Specimen Description   Final    BLOOD RIGHT ANTECUBITAL Performed at [facility], [address] [place], [place] [address]    Special Requests   Final    BOTTLES DRAWN AEROBIC AND ANAEROBIC Blood Culture adequate volume Performed at [facility], [address], [place], [place] [address]    Culture  Setup Time   Final    GRAM NEGATIVE RODS IN BOTH AEROBIC AND ANAEROBIC BOTTLES Gram Stain Report Called to,Read Back By and Verified With: CRAWFORD H. @ 0815 ON [id number] BY HENDERSONL. CRITICAL RESULT CALLED TO, READ BACK BY AND VERIFIED WITH: RN Cheryll Dessert [id number] MLM Performed at [facility], [address], [place], [place] [address]    Culture  ESCHERICHIA COLI (A)  Final   Report Status 02/11/2020 FINAL  Final   Organism ID, Bacteria ESCHERICHIA COLI  Final      Susceptibility   Escherichia coli - MIC*    AMPICILLIN >=32 RESISTANT Resistant     CEFAZOLIN <=4 SENSITIVE Sensitive     CEFEPIME <=0.12 SENSITIVE Sensitive     CEFTAZIDIME <=1 SENSITIVE Sensitive     CEFTRIAXONE <=0.25 SENSITIVE Sensitive     CIPROFLOXACIN <=0.25 SENSITIVE Sensitive     GENTAMICIN <=1 SENSITIVE Sensitive     IMIPENEM <=0.25 SENSITIVE Sensitive     TRIMETH/SULFA >=320 RESISTANT Resistant     AMPICILLIN/SULBACTAM >=32 RESISTANT Resistant     PIP/TAZO <=4 SENSITIVE Sensitive     * ESCHERICHIA COLI  Blood Culture ID Panel (Reflexed)     Status: Abnormal   Collection Time: 02/08/20  5:35 PM  Result Value Ref Range Status   Enterococcus  faecalis NOT DETECTED NOT DETECTED Final   Enterococcus Faecium NOT DETECTED NOT DETECTED Final   Listeria monocytogenes NOT DETECTED NOT DETECTED Final   Staphylococcus species NOT DETECTED NOT DETECTED Final   Staphylococcus aureus (BCID) NOT DETECTED NOT DETECTED Final   Staphylococcus epidermidis NOT DETECTED NOT DETECTED Final   Staphylococcus lugdunensis NOT DETECTED NOT DETECTED Final   Streptococcus species NOT DETECTED NOT DETECTED Final   Streptococcus agalactiae NOT DETECTED NOT DETECTED Final   Streptococcus pneumoniae NOT DETECTED NOT DETECTED Final   Streptococcus pyogenes NOT DETECTED NOT DETECTED Final   A.calcoaceticus-baumannii NOT DETECTED NOT DETECTED Final   Bacteroides fragilis NOT DETECTED NOT DETECTED Final   Enterobacterales DETECTED (A) NOT DETECTED Final    Comment: Enterobacterales represent a large order of gram negative bacteria, not a single organism. CRITICAL RESULT CALLED TO, READ BACK BY AND VERIFIED WITH: RN M DOSS [id number] MLM    Enterobacter cloacae complex NOT DETECTED NOT DETECTED Final   Escherichia coli DETECTED (A) NOT DETECTED Final    Comment: CRITICAL RESULT CALLED  TO, READ BACK BY AND VERIFIED WITH: RN M DOSS [id number] MLM    Klebsiella aerogenes NOT DETECTED NOT DETECTED Final   Klebsiella oxytoca NOT DETECTED NOT DETECTED Final   Klebsiella pneumoniae NOT DETECTED NOT DETECTED Final   Proteus species NOT DETECTED NOT DETECTED Final   Salmonella species NOT DETECTED NOT DETECTED Final   Serratia marcescens NOT DETECTED NOT DETECTED Final   Haemophilus influenzae NOT DETECTED NOT DETECTED Final   Neisseria meningitidis NOT DETECTED NOT DETECTED Final   Pseudomonas aeruginosa NOT DETECTED NOT DETECTED Final   Stenotrophomonas maltophilia NOT DETECTED NOT DETECTED Final   Candida albicans NOT DETECTED NOT DETECTED Final   Candida auris NOT DETECTED NOT DETECTED Final   Candida glabrata NOT DETECTED NOT DETECTED Final   Candida krusei NOT DETECTED NOT DETECTED Final   Candida parapsilosis NOT DETECTED NOT DETECTED Final   Candida tropicalis NOT DETECTED NOT DETECTED Final   Cryptococcus neoformans/gattii NOT DETECTED NOT DETECTED Final   CTX-M ESBL NOT DETECTED NOT DETECTED Final   Carbapenem resistance IMP NOT DETECTED NOT DETECTED Final   Carbapenem resistance KPC NOT DETECTED NOT DETECTED Final   Carbapenem resistance NDM NOT DETECTED NOT DETECTED Final   Carbapenem resist OXA 48 LIKE NOT DETECTED NOT DETECTED Final   Carbapenem resistance VIM NOT DETECTED NOT DETECTED Final    Comment: Performed at [facility], [address] [place], [place] [address]  Blood Culture (routine x 2)     Status: Abnormal   Collection Time: 02/08/20  5:36 PM   Specimen: BLOOD RIGHT ARM  Result Value Ref Range Status   Specimen Description   Final    BLOOD RIGHT ARM Performed at [facility], [address] [place], [place] [address]    Special Requests   Final    BOTTLES DRAWN AEROBIC ONLY Blood Culture adequate volume Performed at [facility], [address] [place], [place] [address]    Culture  Setup Time   Final    GRAM NEGATIVE RODS AEROBIC  BOTTLE ONLY Gram Stain Report Called to,Read Back By and Verified With: CRAWFORD @ 1540 ON [id number] BY HENDERSON L. CRITICAL VALUE NOTED.  VALUE IS CONSISTENT WITH PREVIOUSLY REPORTED AND CALLED VALUE.    Culture (A)  Final    ESCHERICHIA COLI SUSCEPTIBILITIES PERFORMED ON PREVIOUS CULTURE WITHIN THE LAST 5 DAYS. Performed at [facility], [place] [address], [place], [place] [address]  Report Status 02/11/2020 FINAL  Final  Resp Panel by RT-PCR (Flu A&B, Covid) Nasopharyngeal Swab     Status: None   Collection Time: 02/14/20  3:20 PM   Specimen: Nasopharyngeal Swab; Nasopharyngeal(NP) swabs in vial transport medium  Result Value Ref Range Status   SARS Coronavirus 2 by RT PCR NEGATIVE NEGATIVE Final    Comment: (NOTE) SARS-CoV-2 target nucleic acids are NOT DETECTED.  The SARS-CoV-2 RNA is generally detectable in upper respiratory specimens during the acute phase of infection. The lowest concentration of SARS-CoV-2 viral copies this assay can detect is 138 copies/mL. A negative result does not preclude SARS-Cov-2 infection and should not be used as the sole basis for treatment or other patient management decisions. A negative result may occur with  improper specimen collection/handling, submission of specimen other than nasopharyngeal swab, presence of viral mutation(s) within the areas targeted by this assay, and inadequate number of viral copies(<138 copies/mL). A negative result must be combined with clinical observations, patient history, and epidemiological information. The expected result is Negative.  Fact Sheet for Patients:  EntrepreneurPulse.com.au  Fact Sheet for Healthcare Providers:  IncredibleEmployment.be  This test is no t yet approved or cleared by the [place] FDA and  has been authorized for detection and/or diagnosis of SARS-CoV-2 by FDA under an Emergency Use Authorization (EUA). This EUA will remain  in effect  (meaning this test can be used) for the duration of the COVID-19 declaration under Section 564(b)(1) of the Act, 21 U.S.C.section 360bbb-3(b)(1), unless the authorization is terminated  or revoked sooner.       Influenza A by PCR NEGATIVE NEGATIVE Final   Influenza B by PCR NEGATIVE NEGATIVE Final    Comment: (NOTE) The Xpert Xpress SARS-CoV-2/FLU/RSV plus assay is intended as an aid in the diagnosis of influenza from Nasopharyngeal swab specimens and should not be used as a sole basis for treatment. Nasal washings and aspirates are unacceptable for Xpert Xpress SARS-CoV-2/FLU/RSV testing.  Fact Sheet for Patients: EntrepreneurPulse.com.au  Fact Sheet for Healthcare Providers: IncredibleEmployment.be  This test is not yet approved or cleared by the [place] FDA and has been authorized for detection and/or diagnosis of SARS-CoV-2 by FDA under an Emergency Use Authorization (EUA). This EUA will remain in effect (meaning this test can be used) for the duration of the COVID-19 declaration under Section 564(b)(1) of the Act, 21 U.S.C. section 360bbb-3(b)(1), unless the authorization is terminated or revoked.  Performed at [facility], [address], [place], [place] [address]    Time coordinating discharge: 38 mins   SIGNED:  Irwin Brakeman, MD  [facility] 02/15/2020, 9:53 AM How to contact the [facility] Attending or Consulting provider [place] or covering provider during after hours [place], for this patient?  1. Check the care team in [facility] and look for a) attending/consulting TRH provider listed and b) the [facility] team listed 2. Log into [email or web address] and use [place] universal password to access. If you do not have the password, please contact the hospital operator. 3. Locate the [facility] provider you are looking for under [facility] and page to a number that you can be directly reached. 4. If you still have difficulty reaching  the provider, please page the [facility] (Director on Call) for the Hospitalists listed on amion for assistance.

## 2020-02-14 NOTE — TOC Transition Note (Signed)
Transition of Care [facility]) - CM/SW Discharge Note   Patient Details  Name: [name] MRN: [id number] Date of Birth: [birth year]  Transition [facility]) CM/SW Contact:  Ihor Gully, LCSW Phone Number: 02/14/2020, 3:10 PM   Clinical Narrative:    Discharge clinicals sent to facility. Patient will go to room 408-1. RN Network engineer to call EMS once negative rapid covid results. TOC signing off.    Final next level of care: Skilled Nursing Facility Barriers to Discharge: Continued Medical Work up   Patient Goals and CMS Choice Patient states their goals for this hospitalization and ongoing recovery are:: to go to SNF for rehab. CMS Medicare.gov Compare Post Acute Care list provided to:: Patient Represenative (must comment) Choice offered to / list presented to : Adult Children  Discharge Placement              Patient chooses bed at: [facility] Patient to be transferred to facility by: [place] Name of family member notified: Message left for daughter, Ms. Priddy Patient and family notified of of transfer: 02/14/20  Discharge Plan and Services                                     Social Determinants of Health (SDOH) Interventions     Readmission Risk Interventions No flowsheet data found.

## 2020-02-14 NOTE — Progress Notes (Signed)
Report called to Dixon at [facility] in [place].

## 2020-02-14 NOTE — Plan of Care (Signed)
  Problem: Education: Goal: Knowledge of General Education information will improve Description Including pain rating scale, medication(s)/side effects and non-pharmacologic comfort measures Outcome: Progressing   Problem: Health Behavior/Discharge Planning: Goal: Ability to manage health-related needs will improve Outcome: Progressing   

## 2020-02-15 DIAGNOSIS — R7309 Other abnormal glucose: Secondary | ICD-10-CM | POA: Diagnosis not present

## 2020-02-15 DIAGNOSIS — R404 Transient alteration of awareness: Secondary | ICD-10-CM | POA: Diagnosis not present

## 2020-02-15 DIAGNOSIS — R262 Difficulty in walking, not elsewhere classified: Secondary | ICD-10-CM | POA: Diagnosis not present

## 2020-02-15 DIAGNOSIS — E860 Dehydration: Secondary | ICD-10-CM | POA: Diagnosis not present

## 2020-02-15 DIAGNOSIS — A419 Sepsis, unspecified organism: Secondary | ICD-10-CM | POA: Diagnosis not present

## 2020-02-15 DIAGNOSIS — L89302 Pressure ulcer of unspecified buttock, stage 2: Secondary | ICD-10-CM | POA: Diagnosis not present

## 2020-02-15 DIAGNOSIS — R69 Illness, unspecified: Secondary | ICD-10-CM | POA: Diagnosis not present

## 2020-02-15 DIAGNOSIS — R531 Weakness: Secondary | ICD-10-CM | POA: Diagnosis not present

## 2020-02-15 DIAGNOSIS — R279 Unspecified lack of coordination: Secondary | ICD-10-CM | POA: Diagnosis not present

## 2020-02-15 DIAGNOSIS — Z435 Encounter for attention to cystostomy: Secondary | ICD-10-CM | POA: Diagnosis not present

## 2020-02-15 DIAGNOSIS — N3010 Interstitial cystitis (chronic) without hematuria: Secondary | ICD-10-CM | POA: Diagnosis not present

## 2020-02-15 DIAGNOSIS — E119 Type 2 diabetes mellitus without complications: Secondary | ICD-10-CM | POA: Diagnosis not present

## 2020-02-15 DIAGNOSIS — R488 Other symbolic dysfunctions: Secondary | ICD-10-CM | POA: Diagnosis not present

## 2020-02-15 DIAGNOSIS — N390 Urinary tract infection, site not specified: Secondary | ICD-10-CM | POA: Diagnosis not present

## 2020-02-15 DIAGNOSIS — E785 Hyperlipidemia, unspecified: Secondary | ICD-10-CM | POA: Diagnosis not present

## 2020-02-15 DIAGNOSIS — M6281 Muscle weakness (generalized): Secondary | ICD-10-CM | POA: Diagnosis not present

## 2020-02-15 DIAGNOSIS — I471 Supraventricular tachycardia: Secondary | ICD-10-CM | POA: Diagnosis not present

## 2020-02-15 DIAGNOSIS — E559 Vitamin D deficiency, unspecified: Secondary | ICD-10-CM | POA: Diagnosis not present

## 2020-02-15 LAB — GLUCOSE, CAPILLARY: Glucose-Capillary: 119 mg/dL — ABNORMAL HIGH (ref 70–99)

## 2020-02-15 NOTE — TOC Transition Note (Signed)
Transition of Care [facility]) - CM/SW Discharge Note   Patient Details  Name: Theresa Hunt MRN: [id number] Date of Birth: 1930-03-11  Transition of Care [facility]) CM/SW Contact:  Natasha Bence, LCSW Phone Number: 02/15/2020, 10:02 AM   Clinical Narrative:    D/C delayed because of EMS transportation delay. CSW verified with nurse, MD, and Ebony Hail with [facility] that patient ready for discharge. CSW called EMS and updated med necessity form. CSW also updated family on d/c. TOC signing off.   Final next level of care: Skilled Nursing Facility Barriers to Discharge: Continued Medical Work up   Patient Goals and CMS Choice Patient states their goals for this hospitalization and ongoing recovery are:: to go to SNF for rehab. CMS Medicare.gov Compare Post Acute Care list provided to:: Patient Represenative (must comment) Choice offered to / list presented to : Adult Children  Discharge Placement              Patient chooses bed at: [facility] Patient to be transferred to facility by: [place] Name of family member notified: Message left for daughter, Ms. Priddy Patient and family notified of of transfer: 02/14/20  Discharge Plan and Services                                     Social Determinants of Health (SDOH) Interventions     Readmission Risk Interventions No flowsheet data found.

## 2020-02-15 NOTE — Progress Notes (Signed)
02/15/2020 9:53 AM  Pt was discharged on 12/2.  Unfortunately due to transportation issues was not able to leave hospital until 12/3.    Destine Ambroise [employer]

## 2020-02-15 NOTE — Progress Notes (Signed)
EMS came to transport patient to [facility]. Patient was stable

## 2020-02-18 DIAGNOSIS — E119 Type 2 diabetes mellitus without complications: Secondary | ICD-10-CM | POA: Diagnosis not present

## 2020-02-18 DIAGNOSIS — L89302 Pressure ulcer of unspecified buttock, stage 2: Secondary | ICD-10-CM | POA: Diagnosis not present

## 2020-02-18 DIAGNOSIS — R69 Illness, unspecified: Secondary | ICD-10-CM | POA: Diagnosis not present

## 2020-02-18 DIAGNOSIS — R531 Weakness: Secondary | ICD-10-CM | POA: Diagnosis not present

## 2020-02-18 DIAGNOSIS — N390 Urinary tract infection, site not specified: Secondary | ICD-10-CM | POA: Diagnosis not present

## 2020-02-18 DIAGNOSIS — E860 Dehydration: Secondary | ICD-10-CM | POA: Diagnosis not present

## 2020-02-18 DIAGNOSIS — I471 Supraventricular tachycardia: Secondary | ICD-10-CM | POA: Diagnosis not present

## 2020-02-18 DIAGNOSIS — A419 Sepsis, unspecified organism: Secondary | ICD-10-CM | POA: Diagnosis not present

## 2020-02-22 DIAGNOSIS — N390 Urinary tract infection, site not specified: Secondary | ICD-10-CM | POA: Diagnosis not present

## 2020-02-25 DIAGNOSIS — R7309 Other abnormal glucose: Secondary | ICD-10-CM | POA: Diagnosis not present

## 2020-02-29 DIAGNOSIS — L89302 Pressure ulcer of unspecified buttock, stage 2: Secondary | ICD-10-CM | POA: Diagnosis not present

## 2020-02-29 DIAGNOSIS — N390 Urinary tract infection, site not specified: Secondary | ICD-10-CM | POA: Diagnosis not present

## 2020-03-05 DIAGNOSIS — E860 Dehydration: Secondary | ICD-10-CM | POA: Diagnosis not present

## 2020-03-06 DIAGNOSIS — E119 Type 2 diabetes mellitus without complications: Secondary | ICD-10-CM | POA: Diagnosis not present

## 2020-03-06 DIAGNOSIS — E559 Vitamin D deficiency, unspecified: Secondary | ICD-10-CM | POA: Diagnosis not present

## 2020-03-10 DIAGNOSIS — A419 Sepsis, unspecified organism: Secondary | ICD-10-CM | POA: Diagnosis not present

## 2020-03-10 DIAGNOSIS — R262 Difficulty in walking, not elsewhere classified: Secondary | ICD-10-CM | POA: Diagnosis not present

## 2020-03-10 DIAGNOSIS — R488 Other symbolic dysfunctions: Secondary | ICD-10-CM | POA: Diagnosis not present

## 2020-03-10 DIAGNOSIS — N390 Urinary tract infection, site not specified: Secondary | ICD-10-CM | POA: Diagnosis not present

## 2020-03-10 DIAGNOSIS — M6281 Muscle weakness (generalized): Secondary | ICD-10-CM | POA: Diagnosis not present

## 2020-03-11 DIAGNOSIS — M6281 Muscle weakness (generalized): Secondary | ICD-10-CM | POA: Diagnosis not present

## 2020-03-11 DIAGNOSIS — A419 Sepsis, unspecified organism: Secondary | ICD-10-CM | POA: Diagnosis not present

## 2020-03-11 DIAGNOSIS — R262 Difficulty in walking, not elsewhere classified: Secondary | ICD-10-CM | POA: Diagnosis not present

## 2020-03-11 DIAGNOSIS — R488 Other symbolic dysfunctions: Secondary | ICD-10-CM | POA: Diagnosis not present

## 2020-03-11 DIAGNOSIS — N390 Urinary tract infection, site not specified: Secondary | ICD-10-CM | POA: Diagnosis not present

## 2020-03-12 DIAGNOSIS — N390 Urinary tract infection, site not specified: Secondary | ICD-10-CM | POA: Diagnosis not present

## 2020-03-12 DIAGNOSIS — R488 Other symbolic dysfunctions: Secondary | ICD-10-CM | POA: Diagnosis not present

## 2020-03-12 DIAGNOSIS — R262 Difficulty in walking, not elsewhere classified: Secondary | ICD-10-CM | POA: Diagnosis not present

## 2020-03-12 DIAGNOSIS — M6281 Muscle weakness (generalized): Secondary | ICD-10-CM | POA: Diagnosis not present

## 2020-03-12 DIAGNOSIS — A419 Sepsis, unspecified organism: Secondary | ICD-10-CM | POA: Diagnosis not present

## 2020-03-13 DIAGNOSIS — R262 Difficulty in walking, not elsewhere classified: Secondary | ICD-10-CM | POA: Diagnosis not present

## 2020-03-13 DIAGNOSIS — M6281 Muscle weakness (generalized): Secondary | ICD-10-CM | POA: Diagnosis not present

## 2020-03-13 DIAGNOSIS — A419 Sepsis, unspecified organism: Secondary | ICD-10-CM | POA: Diagnosis not present

## 2020-03-13 DIAGNOSIS — N390 Urinary tract infection, site not specified: Secondary | ICD-10-CM | POA: Diagnosis not present

## 2020-03-13 DIAGNOSIS — R488 Other symbolic dysfunctions: Secondary | ICD-10-CM | POA: Diagnosis not present

## 2020-03-14 DIAGNOSIS — R262 Difficulty in walking, not elsewhere classified: Secondary | ICD-10-CM | POA: Diagnosis not present

## 2020-03-14 DIAGNOSIS — A419 Sepsis, unspecified organism: Secondary | ICD-10-CM | POA: Diagnosis not present

## 2020-03-14 DIAGNOSIS — M6281 Muscle weakness (generalized): Secondary | ICD-10-CM | POA: Diagnosis not present

## 2020-03-14 DIAGNOSIS — R488 Other symbolic dysfunctions: Secondary | ICD-10-CM | POA: Diagnosis not present

## 2020-03-14 DIAGNOSIS — N390 Urinary tract infection, site not specified: Secondary | ICD-10-CM | POA: Diagnosis not present

## 2020-03-17 DIAGNOSIS — R69 Illness, unspecified: Secondary | ICD-10-CM | POA: Diagnosis not present

## 2020-03-17 DIAGNOSIS — A419 Sepsis, unspecified organism: Secondary | ICD-10-CM | POA: Diagnosis not present

## 2020-03-17 DIAGNOSIS — R262 Difficulty in walking, not elsewhere classified: Secondary | ICD-10-CM | POA: Diagnosis not present

## 2020-03-17 DIAGNOSIS — M6281 Muscle weakness (generalized): Secondary | ICD-10-CM | POA: Diagnosis not present

## 2020-03-17 DIAGNOSIS — Z435 Encounter for attention to cystostomy: Secondary | ICD-10-CM | POA: Diagnosis not present

## 2020-03-17 DIAGNOSIS — R419 Unspecified symptoms and signs involving cognitive functions and awareness: Secondary | ICD-10-CM | POA: Diagnosis not present

## 2020-03-18 DIAGNOSIS — R69 Illness, unspecified: Secondary | ICD-10-CM | POA: Diagnosis not present

## 2020-03-18 DIAGNOSIS — M6281 Muscle weakness (generalized): Secondary | ICD-10-CM | POA: Diagnosis not present

## 2020-03-18 DIAGNOSIS — R419 Unspecified symptoms and signs involving cognitive functions and awareness: Secondary | ICD-10-CM | POA: Diagnosis not present

## 2020-03-18 DIAGNOSIS — A419 Sepsis, unspecified organism: Secondary | ICD-10-CM | POA: Diagnosis not present

## 2020-03-18 DIAGNOSIS — R262 Difficulty in walking, not elsewhere classified: Secondary | ICD-10-CM | POA: Diagnosis not present

## 2020-03-18 DIAGNOSIS — Z435 Encounter for attention to cystostomy: Secondary | ICD-10-CM | POA: Diagnosis not present

## 2020-03-19 DIAGNOSIS — R419 Unspecified symptoms and signs involving cognitive functions and awareness: Secondary | ICD-10-CM | POA: Diagnosis not present

## 2020-03-19 DIAGNOSIS — M6281 Muscle weakness (generalized): Secondary | ICD-10-CM | POA: Diagnosis not present

## 2020-03-19 DIAGNOSIS — A419 Sepsis, unspecified organism: Secondary | ICD-10-CM | POA: Diagnosis not present

## 2020-03-19 DIAGNOSIS — R69 Illness, unspecified: Secondary | ICD-10-CM | POA: Diagnosis not present

## 2020-03-19 DIAGNOSIS — R262 Difficulty in walking, not elsewhere classified: Secondary | ICD-10-CM | POA: Diagnosis not present

## 2020-03-19 DIAGNOSIS — Z435 Encounter for attention to cystostomy: Secondary | ICD-10-CM | POA: Diagnosis not present

## 2020-03-20 DIAGNOSIS — I10 Essential (primary) hypertension: Secondary | ICD-10-CM | POA: Diagnosis not present

## 2020-03-20 DIAGNOSIS — R69 Illness, unspecified: Secondary | ICD-10-CM | POA: Diagnosis not present

## 2020-03-20 DIAGNOSIS — E119 Type 2 diabetes mellitus without complications: Secondary | ICD-10-CM | POA: Diagnosis not present

## 2020-03-20 DIAGNOSIS — I471 Supraventricular tachycardia: Secondary | ICD-10-CM | POA: Diagnosis not present

## 2020-03-21 DIAGNOSIS — A419 Sepsis, unspecified organism: Secondary | ICD-10-CM | POA: Diagnosis not present

## 2020-03-21 DIAGNOSIS — N390 Urinary tract infection, site not specified: Secondary | ICD-10-CM | POA: Diagnosis not present

## 2020-03-24 DIAGNOSIS — U071 COVID-19: Secondary | ICD-10-CM | POA: Diagnosis not present

## 2020-03-25 DIAGNOSIS — Z20828 Contact with and (suspected) exposure to other viral communicable diseases: Secondary | ICD-10-CM | POA: Diagnosis not present

## 2020-03-25 DIAGNOSIS — U071 COVID-19: Secondary | ICD-10-CM | POA: Diagnosis not present

## 2020-04-01 DIAGNOSIS — U071 COVID-19: Secondary | ICD-10-CM | POA: Diagnosis not present

## 2020-04-07 DIAGNOSIS — A419 Sepsis, unspecified organism: Secondary | ICD-10-CM | POA: Diagnosis not present

## 2020-04-07 DIAGNOSIS — M6281 Muscle weakness (generalized): Secondary | ICD-10-CM | POA: Diagnosis not present

## 2020-04-07 DIAGNOSIS — R262 Difficulty in walking, not elsewhere classified: Secondary | ICD-10-CM | POA: Diagnosis not present

## 2020-04-07 DIAGNOSIS — Z435 Encounter for attention to cystostomy: Secondary | ICD-10-CM | POA: Diagnosis not present

## 2020-04-07 DIAGNOSIS — R419 Unspecified symptoms and signs involving cognitive functions and awareness: Secondary | ICD-10-CM | POA: Diagnosis not present

## 2020-04-07 DIAGNOSIS — W19XXXA Unspecified fall, initial encounter: Secondary | ICD-10-CM | POA: Diagnosis not present

## 2020-04-07 DIAGNOSIS — R69 Illness, unspecified: Secondary | ICD-10-CM | POA: Diagnosis not present

## 2020-04-07 DIAGNOSIS — R531 Weakness: Secondary | ICD-10-CM | POA: Diagnosis not present

## 2020-04-08 DIAGNOSIS — I471 Supraventricular tachycardia: Secondary | ICD-10-CM | POA: Diagnosis not present

## 2020-04-08 DIAGNOSIS — W19XXXA Unspecified fall, initial encounter: Secondary | ICD-10-CM | POA: Diagnosis not present

## 2020-04-10 DIAGNOSIS — L603 Nail dystrophy: Secondary | ICD-10-CM | POA: Diagnosis not present

## 2020-04-10 DIAGNOSIS — R69 Illness, unspecified: Secondary | ICD-10-CM | POA: Diagnosis not present

## 2020-04-10 DIAGNOSIS — I7091 Generalized atherosclerosis: Secondary | ICD-10-CM | POA: Diagnosis not present

## 2020-04-10 DIAGNOSIS — M2042 Other hammer toe(s) (acquired), left foot: Secondary | ICD-10-CM | POA: Diagnosis not present

## 2020-04-10 DIAGNOSIS — M2041 Other hammer toe(s) (acquired), right foot: Secondary | ICD-10-CM | POA: Diagnosis not present

## 2020-04-10 DIAGNOSIS — M2012 Hallux valgus (acquired), left foot: Secondary | ICD-10-CM | POA: Diagnosis not present

## 2020-04-10 DIAGNOSIS — L98499 Non-pressure chronic ulcer of skin of other sites with unspecified severity: Secondary | ICD-10-CM | POA: Diagnosis not present

## 2020-04-10 DIAGNOSIS — M2011 Hallux valgus (acquired), right foot: Secondary | ICD-10-CM | POA: Diagnosis not present

## 2020-04-11 DIAGNOSIS — R69 Illness, unspecified: Secondary | ICD-10-CM | POA: Diagnosis not present

## 2020-04-17 DIAGNOSIS — U071 COVID-19: Secondary | ICD-10-CM | POA: Diagnosis not present

## 2020-04-17 DIAGNOSIS — I10 Essential (primary) hypertension: Secondary | ICD-10-CM | POA: Diagnosis not present

## 2020-04-17 DIAGNOSIS — E860 Dehydration: Secondary | ICD-10-CM | POA: Diagnosis not present

## 2020-04-17 DIAGNOSIS — R69 Illness, unspecified: Secondary | ICD-10-CM | POA: Diagnosis not present

## 2020-04-22 DIAGNOSIS — Z435 Encounter for attention to cystostomy: Secondary | ICD-10-CM | POA: Diagnosis not present

## 2020-04-22 DIAGNOSIS — R262 Difficulty in walking, not elsewhere classified: Secondary | ICD-10-CM | POA: Diagnosis not present

## 2020-04-22 DIAGNOSIS — I471 Supraventricular tachycardia: Secondary | ICD-10-CM | POA: Diagnosis not present

## 2020-04-22 DIAGNOSIS — M6281 Muscle weakness (generalized): Secondary | ICD-10-CM | POA: Diagnosis not present

## 2020-05-06 DIAGNOSIS — Z961 Presence of intraocular lens: Secondary | ICD-10-CM | POA: Diagnosis not present

## 2020-05-06 DIAGNOSIS — E119 Type 2 diabetes mellitus without complications: Secondary | ICD-10-CM | POA: Diagnosis not present

## 2020-05-19 DIAGNOSIS — R5383 Other fatigue: Secondary | ICD-10-CM | POA: Diagnosis not present

## 2020-05-19 DIAGNOSIS — R69 Illness, unspecified: Secondary | ICD-10-CM | POA: Diagnosis not present

## 2020-05-20 DIAGNOSIS — N390 Urinary tract infection, site not specified: Secondary | ICD-10-CM | POA: Diagnosis not present

## 2020-05-24 DIAGNOSIS — N390 Urinary tract infection, site not specified: Secondary | ICD-10-CM | POA: Diagnosis not present

## 2020-05-29 DIAGNOSIS — N390 Urinary tract infection, site not specified: Secondary | ICD-10-CM | POA: Diagnosis not present

## 2020-05-29 DIAGNOSIS — R69 Illness, unspecified: Secondary | ICD-10-CM | POA: Diagnosis not present

## 2020-05-29 DIAGNOSIS — E119 Type 2 diabetes mellitus without complications: Secondary | ICD-10-CM | POA: Diagnosis not present

## 2020-05-29 DIAGNOSIS — R319 Hematuria, unspecified: Secondary | ICD-10-CM | POA: Diagnosis not present

## 2020-05-29 DIAGNOSIS — I10 Essential (primary) hypertension: Secondary | ICD-10-CM | POA: Diagnosis not present

## 2020-05-29 DIAGNOSIS — I471 Supraventricular tachycardia: Secondary | ICD-10-CM | POA: Diagnosis not present

## 2020-05-30 DIAGNOSIS — N390 Urinary tract infection, site not specified: Secondary | ICD-10-CM | POA: Diagnosis not present

## 2020-06-08 DIAGNOSIS — R319 Hematuria, unspecified: Secondary | ICD-10-CM | POA: Diagnosis not present

## 2020-06-09 DIAGNOSIS — E559 Vitamin D deficiency, unspecified: Secondary | ICD-10-CM | POA: Diagnosis not present

## 2020-06-09 DIAGNOSIS — R7309 Other abnormal glucose: Secondary | ICD-10-CM | POA: Diagnosis not present

## 2020-06-09 DIAGNOSIS — D689 Coagulation defect, unspecified: Secondary | ICD-10-CM | POA: Diagnosis not present

## 2020-06-09 DIAGNOSIS — N390 Urinary tract infection, site not specified: Secondary | ICD-10-CM | POA: Diagnosis not present

## 2020-06-09 DIAGNOSIS — E119 Type 2 diabetes mellitus without complications: Secondary | ICD-10-CM | POA: Diagnosis not present

## 2020-06-11 DIAGNOSIS — E1159 Type 2 diabetes mellitus with other circulatory complications: Secondary | ICD-10-CM | POA: Diagnosis not present

## 2020-06-11 DIAGNOSIS — L603 Nail dystrophy: Secondary | ICD-10-CM | POA: Diagnosis not present

## 2020-06-11 DIAGNOSIS — N390 Urinary tract infection, site not specified: Secondary | ICD-10-CM | POA: Diagnosis not present

## 2020-06-13 ENCOUNTER — Other Ambulatory Visit: Payer: Self-pay

## 2020-06-13 ENCOUNTER — Emergency Department (HOSPITAL_COMMUNITY): Payer: Medicare HMO

## 2020-06-13 ENCOUNTER — Observation Stay (HOSPITAL_COMMUNITY): Payer: Medicare HMO

## 2020-06-13 ENCOUNTER — Encounter (HOSPITAL_COMMUNITY): Payer: Self-pay | Admitting: Emergency Medicine

## 2020-06-13 ENCOUNTER — Observation Stay (HOSPITAL_COMMUNITY)
Admission: EM | Admit: 2020-06-13 | Discharge: 2020-06-14 | Disposition: A | Discharge status: Home / Self Care | Payer: Medicare HMO | Attending: Family Medicine | Admitting: Family Medicine

## 2020-06-13 DIAGNOSIS — R69 Illness, unspecified: Secondary | ICD-10-CM | POA: Diagnosis not present

## 2020-06-13 DIAGNOSIS — T85858D Stenosis due to other internal prosthetic devices, implants and grafts, subsequent encounter: Secondary | ICD-10-CM

## 2020-06-13 DIAGNOSIS — I672 Cerebral atherosclerosis: Secondary | ICD-10-CM | POA: Diagnosis not present

## 2020-06-13 DIAGNOSIS — T8385XD Stenosis of genitourinary prosthetic devices, implants and grafts, subsequent encounter: Secondary | ICD-10-CM | POA: Insufficient documentation

## 2020-06-13 DIAGNOSIS — Z7984 Long term (current) use of oral hypoglycemic drugs: Secondary | ICD-10-CM | POA: Insufficient documentation

## 2020-06-13 DIAGNOSIS — G301 Alzheimer's disease with late onset: Secondary | ICD-10-CM | POA: Diagnosis not present

## 2020-06-13 DIAGNOSIS — I471 Supraventricular tachycardia, unspecified: Secondary | ICD-10-CM | POA: Diagnosis present

## 2020-06-13 DIAGNOSIS — R9082 White matter disease, unspecified: Secondary | ICD-10-CM | POA: Diagnosis not present

## 2020-06-13 DIAGNOSIS — M6281 Muscle weakness (generalized): Secondary | ICD-10-CM | POA: Insufficient documentation

## 2020-06-13 DIAGNOSIS — E119 Type 2 diabetes mellitus without complications: Secondary | ICD-10-CM | POA: Insufficient documentation

## 2020-06-13 DIAGNOSIS — Z20822 Contact with and (suspected) exposure to covid-19: Secondary | ICD-10-CM | POA: Insufficient documentation

## 2020-06-13 DIAGNOSIS — G459 Transient cerebral ischemic attack, unspecified: Secondary | ICD-10-CM | POA: Diagnosis not present

## 2020-06-13 DIAGNOSIS — F0390 Unspecified dementia without behavioral disturbance: Secondary | ICD-10-CM | POA: Diagnosis present

## 2020-06-13 DIAGNOSIS — N3001 Acute cystitis with hematuria: Secondary | ICD-10-CM

## 2020-06-13 DIAGNOSIS — R531 Weakness: Secondary | ICD-10-CM | POA: Diagnosis not present

## 2020-06-13 DIAGNOSIS — I10 Essential (primary) hypertension: Secondary | ICD-10-CM | POA: Diagnosis present

## 2020-06-13 DIAGNOSIS — E785 Hyperlipidemia, unspecified: Secondary | ICD-10-CM | POA: Diagnosis present

## 2020-06-13 DIAGNOSIS — I6782 Cerebral ischemia: Secondary | ICD-10-CM | POA: Diagnosis not present

## 2020-06-13 DIAGNOSIS — Z79899 Other long term (current) drug therapy: Secondary | ICD-10-CM | POA: Insufficient documentation

## 2020-06-13 DIAGNOSIS — I6523 Occlusion and stenosis of bilateral carotid arteries: Secondary | ICD-10-CM | POA: Diagnosis not present

## 2020-06-13 DIAGNOSIS — Z7982 Long term (current) use of aspirin: Secondary | ICD-10-CM | POA: Diagnosis not present

## 2020-06-13 DIAGNOSIS — R202 Paresthesia of skin: Secondary | ICD-10-CM | POA: Diagnosis not present

## 2020-06-13 DIAGNOSIS — Y813 Surgical instruments, materials and general- and plastic-surgery devices (including sutures) associated with adverse incidents: Secondary | ICD-10-CM | POA: Insufficient documentation

## 2020-06-13 DIAGNOSIS — I771 Stricture of artery: Secondary | ICD-10-CM | POA: Diagnosis not present

## 2020-06-13 DIAGNOSIS — R9431 Abnormal electrocardiogram [ECG] [EKG]: Secondary | ICD-10-CM | POA: Diagnosis not present

## 2020-06-13 DIAGNOSIS — I639 Cerebral infarction, unspecified: Secondary | ICD-10-CM

## 2020-06-13 DIAGNOSIS — R29818 Other symptoms and signs involving the nervous system: Secondary | ICD-10-CM | POA: Diagnosis not present

## 2020-06-13 DIAGNOSIS — Z8673 Personal history of transient ischemic attack (TIA), and cerebral infarction without residual deficits: Secondary | ICD-10-CM | POA: Diagnosis not present

## 2020-06-13 LAB — APTT: aPTT: 27 seconds (ref 24–36)

## 2020-06-13 LAB — CBC
HCT: 41.3 % (ref 36.0–46.0)
Hemoglobin: 13.1 g/dL (ref 12.0–15.0)
MCH: 30.7 pg (ref 26.0–34.0)
MCHC: 31.7 g/dL (ref 30.0–36.0)
MCV: 96.7 fL (ref 80.0–100.0)
Platelets: 162 10*3/uL (ref 150–400)
RBC: 4.27 MIL/uL (ref 3.87–5.11)
RDW: 13.2 % (ref 11.5–15.5)
WBC: 6.7 10*3/uL (ref 4.0–10.5)
nRBC: 0 % (ref 0.0–0.2)

## 2020-06-13 LAB — URINALYSIS, ROUTINE W REFLEX MICROSCOPIC
Bacteria, UA: NONE SEEN
Bilirubin Urine: NEGATIVE
Glucose, UA: NEGATIVE mg/dL
Ketones, ur: NEGATIVE mg/dL
Nitrite: NEGATIVE
Protein, ur: 100 mg/dL — AB
RBC / HPF: >50 RBC/hpf — ABNORMAL HIGH (ref 0–5)
Specific Gravity, Urine: 1.013 (ref 1.005–1.030)
WBC, UA: >50 WBC/hpf — ABNORMAL HIGH (ref 0–5)
pH: 9 — ABNORMAL HIGH (ref 5.0–8.0)

## 2020-06-13 LAB — CBG MONITORING, ED
Glucose-Capillary: 109 mg/dL — ABNORMAL HIGH (ref 70–99)
Glucose-Capillary: 95 mg/dL (ref 70–99)

## 2020-06-13 LAB — COMPREHENSIVE METABOLIC PANEL
ALT: 16 U/L (ref 0–44)
AST: 25 U/L (ref 15–41)
Albumin: 3.4 g/dL — ABNORMAL LOW (ref 3.5–5.0)
Alkaline Phosphatase: 88 U/L (ref 38–126)
Anion gap: 10 (ref 5–15)
BUN: 30 mg/dL — ABNORMAL HIGH (ref 8–23)
CO2: 24 mmol/L (ref 22–32)
Calcium: 10.5 mg/dL — ABNORMAL HIGH (ref 8.9–10.3)
Chloride: 101 mmol/L (ref 98–111)
Creatinine, Ser: 0.71 mg/dL (ref 0.44–1.00)
GFR, Estimated: >60 mL/min (ref >60)
Glucose, Bld: 131 mg/dL — ABNORMAL HIGH (ref 70–99)
Potassium: 4.5 mmol/L (ref 3.5–5.1)
Sodium: 135 mmol/L (ref 135–145)
Total Bilirubin: 0.6 mg/dL (ref 0.3–1.2)
Total Protein: 7 g/dL (ref 6.5–8.1)

## 2020-06-13 LAB — DIFFERENTIAL
Abs Immature Granulocytes: 0.03 10*3/uL (ref 0.00–0.07)
Basophils Absolute: 0 10*3/uL (ref 0.0–0.1)
Basophils Relative: 0 %
Eosinophils Absolute: 0.2 10*3/uL (ref 0.0–0.5)
Eosinophils Relative: 4 %
Immature Granulocytes: 0 %
Lymphocytes Relative: 20 %
Lymphs Abs: 1.3 10*3/uL (ref 0.7–4.0)
Monocytes Absolute: 0.6 10*3/uL (ref 0.1–1.0)
Monocytes Relative: 8 %
Neutro Abs: 4.5 10*3/uL (ref 1.7–7.7)
Neutrophils Relative %: 68 %

## 2020-06-13 LAB — HEMOGLOBIN A1C
Hgb A1c MFr Bld: 6.8 % — ABNORMAL HIGH (ref 4.8–5.6)
Mean Plasma Glucose: 148.46 mg/dL

## 2020-06-13 LAB — RAPID URINE DRUG SCREEN, HOSP PERFORMED
Amphetamines: NOT DETECTED
Barbiturates: NOT DETECTED
Benzodiazepines: NOT DETECTED
Cocaine: NOT DETECTED
Opiates: NOT DETECTED
Tetrahydrocannabinol: NOT DETECTED

## 2020-06-13 LAB — PROCALCITONIN: Procalcitonin: <0.1 ng/mL

## 2020-06-13 LAB — RESP PANEL BY RT-PCR (FLU A&B, COVID) ARPGX2
Influenza A by PCR: NEGATIVE
Influenza B by PCR: NEGATIVE
SARS Coronavirus 2 by RT PCR: NEGATIVE

## 2020-06-13 LAB — ETHANOL: Alcohol, Ethyl (B): <10 mg/dL (ref <10)

## 2020-06-13 LAB — PROTIME-INR
INR: 1.1 (ref 0.8–1.2)
Prothrombin Time: 13.6 seconds (ref 11.4–15.2)

## 2020-06-13 MED ORDER — ACETAMINOPHEN 325 MG PO TABS
650.0000 mg | ORAL_TABLET | ORAL | Status: DC | PRN
  Administered 2020-06-14: 650 mg via ORAL
  Filled 2020-06-13: qty 2

## 2020-06-13 MED ORDER — STROKE: EARLY STAGES OF RECOVERY BOOK
Freq: Once | Status: AC
  Filled 2020-06-13: qty 1

## 2020-06-13 MED ORDER — SODIUM CHLORIDE 0.9 % IV SOLN
1.0000 g | Freq: Once | INTRAVENOUS | Status: AC
  Administered 2020-06-13: 1 g via INTRAVENOUS
  Filled 2020-06-13: qty 10

## 2020-06-13 MED ORDER — ZINC GLUCONATE 50 MG PO TABS: 50.0000 mg | ORAL_TABLET | Freq: Every day | ORAL | Status: DC

## 2020-06-13 MED ORDER — LORATADINE 10 MG PO TABS: 10.0000 mg | ORAL_TABLET | Freq: Every day | ORAL | Status: DC

## 2020-06-13 MED ORDER — LORATADINE 10 MG PO TABS
10.0000 mg | ORAL_TABLET | Freq: Every day | ORAL | Status: DC
  Administered 2020-06-14: 10 mg via ORAL
  Filled 2020-06-13: qty 1

## 2020-06-13 MED ORDER — HEPARIN SODIUM (PORCINE) 5000 UNIT/ML IJ SOLN
5000.0000 [IU] | Freq: Three times a day (TID) | INTRAMUSCULAR | Status: DC
  Administered 2020-06-13 – 2020-06-14 (×3): 5000 [IU] via SUBCUTANEOUS
  Filled 2020-06-13 (×3): qty 1

## 2020-06-13 MED ORDER — ASPIRIN 81 MG PO CHEW: 81.0000 mg | CHEWABLE_TABLET | Freq: Every day | ORAL | Status: DC

## 2020-06-13 MED ORDER — ASPIRIN 325 MG PO TABS
325.0000 mg | ORAL_TABLET | Freq: Once | ORAL | Status: AC
  Administered 2020-06-13: 325 mg via ORAL
  Filled 2020-06-13: qty 1

## 2020-06-13 MED ORDER — FLUCONAZOLE 150 MG PO TABS
150.0000 mg | ORAL_TABLET | Freq: Once | ORAL | Status: AC
  Administered 2020-06-13: 150 mg via ORAL
  Filled 2020-06-13: qty 1

## 2020-06-13 MED ORDER — SODIUM CHLORIDE 0.9 % IV BOLUS
500.0000 mL | Freq: Once | INTRAVENOUS | Status: AC
  Administered 2020-06-13: 500 mL via INTRAVENOUS

## 2020-06-13 MED ORDER — INSULIN ASPART 100 UNIT/ML ~~LOC~~ SOLN
0.0000 [IU] | Freq: Three times a day (TID) | SUBCUTANEOUS | Status: DC
  Administered 2020-06-14: 1 [IU] via SUBCUTANEOUS

## 2020-06-13 MED ORDER — METOPROLOL SUCCINATE ER 50 MG PO TB24
75.0000 mg | ORAL_TABLET | Freq: Every day | ORAL | Status: DC
  Administered 2020-06-14: 75 mg via ORAL
  Filled 2020-06-13: qty 1

## 2020-06-13 MED ORDER — MEMANTINE HCL 10 MG PO TABS
10.0000 mg | ORAL_TABLET | Freq: Every day | ORAL | Status: DC
  Administered 2020-06-13 – 2020-06-14 (×2): 10 mg via ORAL
  Filled 2020-06-13 (×2): qty 1

## 2020-06-13 MED ORDER — SERTRALINE HCL 50 MG PO TABS: 25.0000 mg | ORAL_TABLET | Freq: Every day | ORAL | Status: DC

## 2020-06-13 MED ORDER — NITROFURANTOIN MONOHYD MACRO 100 MG PO CAPS
100.0000 mg | ORAL_CAPSULE | Freq: Two times a day (BID) | ORAL | Status: DC
  Administered 2020-06-13 – 2020-06-14 (×2): 100 mg via ORAL
  Filled 2020-06-13 (×2): qty 1

## 2020-06-13 MED ORDER — SERTRALINE HCL 50 MG PO TABS
25.0000 mg | ORAL_TABLET | Freq: Every day | ORAL | Status: DC
  Administered 2020-06-14: 25 mg via ORAL
  Filled 2020-06-13: qty 1

## 2020-06-13 MED ORDER — ASCORBIC ACID 500 MG PO TABS: 500.0000 mg | ORAL_TABLET | Freq: Every day | ORAL | Status: DC

## 2020-06-13 MED ORDER — SODIUM CHLORIDE 0.9 % IV SOLN
100.0000 mL/h | INTRAVENOUS | Status: DC
  Administered 2020-06-13: 100 mL/h via INTRAVENOUS

## 2020-06-13 MED ORDER — ACETAMINOPHEN 160 MG/5ML PO SOLN
650.0000 mg | ORAL | Status: DC | PRN
Start: 2020-06-13 — End: 2020-06-14

## 2020-06-13 MED ORDER — ACETAMINOPHEN 650 MG RE SUPP
650.0000 mg | RECTAL | Status: DC | PRN
Start: 2020-06-13 — End: 2020-06-14

## 2020-06-13 MED ORDER — VITAMIN D 25 MCG (1000 UNIT) PO TABS
2000.0000 [IU] | ORAL_TABLET | Freq: Every day | ORAL | Status: DC
  Administered 2020-06-14: 2000 [IU] via ORAL
  Filled 2020-06-13: qty 2

## 2020-06-13 MED ORDER — ASCORBIC ACID 500 MG PO TABS
500.0000 mg | ORAL_TABLET | Freq: Every day | ORAL | Status: DC
  Administered 2020-06-14: 500 mg via ORAL
  Filled 2020-06-13: qty 1

## 2020-06-13 NOTE — H&P (Signed)
TRH H&P   Patient Demographics:    Theresa Hunt, is a 87 y.o. female  MRN: [id number]   DOB - Dec 27, 1929  Admit Date - 06/13/2020  Outpatient Primary MD for the patient is [name], MD  Referring MD/NP/PA: Dr Gilford Raid    Patient coming from: SNF  Chief Complaint  Patient presents with  . Hand Problem      HPI:    Theresa Hunt  is a 87 y.o. female, with past medical history relevant forof dementia,Interstitial cystitis/ s/p Prior Cystectomy with Uretero-ileaconduity,HLD, HTN, history of paroxysmal SVT, and diabetes mellitus presenting to the ED with right arm weakness, patient lives at assisted living facility due to dementia, her son last saw her normal yesterday at 2 PM, he came back to see her this morning around 10 AM, patient's roommate told son that she is having trouble with her right arm,, the staff told the son that patient woke up like this, and said her right arm feels numb, with tingling, she denies any other focal deficits, any visual deficits, reports she is able to speak and swallow, no neglect, she is only on aspirin, no other blood thinners the son reports that patient has not getting up to walk because she walks with a walker, and could not use the walker today due to her arm weakness. -In ED her exam noted for right hand weakness, CT head with no acute findings, she received full dose aspirin in ED Alysis was positive, which is usually the case given she is having urostomy bag, she is afebrile, with no leukocytosis, [facility] hospitalist consulted to admit.    Review of systems:    Patient is with dementia, I have tried to obtain review of systems, but her review of system was very   limited due to that. -Neurological is positive for weakness and numbness, all other systems reviewed were negative.  With Past History of the following :    Past Medical History:   Diagnosis Date  . Dementia ([place])   . Hyperlipidemia   . Hypertension   . Interstitial cystitis   . SVT (supraventricular tachycardia) (HCC)       Past Surgical History:  Procedure Laterality Date  . ABDOMINAL HYSTERECTOMY    . CHOLECYSTECTOMY    . CYSTECTOMY W/ URETEROILEAL CONDUIT    . REVISION UROSTOMY CUTANEOUS        Social History:     Social History   Tobacco Use  . Smoking status: Never Smoker  . Smokeless tobacco: Never Used  Substance Use Topics  . Alcohol use: No     Lives - at SNF    Family History :     Family History  Problem Relation Age of Onset  . Emphysema Father   . CAD Mother 38  . Cancer Daughter        Breast     Home Medications:  Prior to Admission medications   Medication Sig Start Date End Date Taking? Authorizing Provider  ascorbic acid (VITAMIN C) 500 MG tablet Take 500 mg by mouth daily.   Yes [provider]  aspirin 81 MG tablet Take 81 mg by mouth daily.   Yes [provider]  Cholecalciferol (VITAMIN D3) 50 MCG (2000 UT) capsule Take 2,000 Units by mouth daily.    Yes [provider]  Control Gel Formula Dressing (DUODERM CGF DRESSING) MISC Apply 1 each topically every 3 (three) days. Size 2x2 will work better for area of skin 11/01/19  Yes Ivy Lynn, NP  loratadine (CLARITIN) 10 MG tablet Take 1 tablet by mouth daily.   Yes [provider]  memantine (NAMENDA) 10 MG tablet Take 10 mg by mouth daily. Take 1/2 tablet by mouth daily 01/17/20  Yes [provider]  metFORMIN (GLUCOPHAGE) 500 MG tablet TAKE ONE (1) TABLET EACH DAY Patient taking differently: Take 500 mg by mouth daily with breakfast. 01/08/20  Yes [name], MD  metoprolol succinate (TOPROL-XL) 25 MG 24 hr tablet Take 3 tablets (75 mg total) by mouth in the morning, at noon, and at bedtime. Take with or immediately following a meal. 02/14/20  Yes Johnson, Clanford L, MD  nitrofurantoin, macrocrystal-monohydrate,  (MACROBID) 100 MG capsule Take 100 mg by mouth 2 (two) times daily.   Yes [provider]  sertraline (ZOLOFT) 25 MG tablet Take 25 mg by mouth daily.   Yes [provider]  zinc gluconate 50 MG tablet Take 50 mg by mouth daily.   Yes [provider]     Allergies:     Allergies  Allergen Reactions  . Codeine Other (See Comments)    Pt. does not remember  . Donepezil Other (See Comments)    hallucinations     Physical Exam:   Vitals  Blood pressure (!) 129/55, pulse 62, temperature 97.8 F (36.6 C), temperature source Oral, resp. rate (!) 22, height 5\' 5"  (1.651 m), weight 64 kg, SpO2 98 %.   1. General Theresa Hunt female, laying in bed, no apparent distress.   2.  She is a pleasant, mildly demented, awake alert x2.  3. No F.N deficits, ALL C.Nerves Intact, with strength and strength 5/5 all 4 extremities, in the right wrist area, unable to strengthen her right hand or wrist, numbness and tingling.  4. Ears and Eyes appear Normal, Conjunctivae clear, PERRLA. Moist Oral Mucosa.  5. Supple Neck, No JVD, No cervical lymphadenopathy appriciated, No Carotid Bruits.  6. Symmetrical Chest wall movement, Good air movement bilaterally, CTAB.  7. RRR, No Gallops, Rubs or Murmurs, No Parasternal Heave.  8. Positive Bowel Sounds, Abdomen Soft, No tenderness, urostomy +,No organomegaly appriciated,No rebound -guarding or rigidity.  9.  No Cyanosis, Normal Skin Turgor, No Skin Rash or Bruise.  10. Good muscle tone,  joints appear normal , no effusions, Normal ROM.  11. No Palpable Lymph Nodes in Neck or Axillae    Data Review:    CBC Recent Labs  Lab 06/13/20 1212  WBC 6.7  HGB 13.1  HCT 41.3  PLT 162  MCV 96.7  MCH 30.7  MCHC 31.7  RDW 13.2  LYMPHSABS 1.3  MONOABS 0.6  EOSABS 0.2  BASOSABS 0.0   ------------------------------------------------------------------------------------------------------------------  Chemistries  Recent Labs   Lab 06/13/20 1212  NA 135  K 4.5  CL 101  CO2 24  GLUCOSE 131*  BUN 30*  CREATININE 0.71  CALCIUM 10.5*  AST  25  ALT 16  ALKPHOS 88  BILITOT 0.6   ------------------------------------------------------------------------------------------------------------------ estimated creatinine clearance is 43.7 mL/min (by C-G formula based on SCr of 0.71 mg/dL). ------------------------------------------------------------------------------------------------------------------ No results for input(s): TSH, T4TOTAL, T3FREE, THYROIDAB in the last 72 hours.  Invalid input(s): FREET3  Coagulation profile Recent Labs  Lab 06/13/20 1212  INR 1.1   ------------------------------------------------------------------------------------------------------------------- No results for input(s): DDIMER in the last 72 hours. -------------------------------------------------------------------------------------------------------------------  Cardiac Enzymes No results for input(s): CKMB, TROPONINI, MYOGLOBIN in the last 168 hours.  Invalid input(s): CK ------------------------------------------------------------------------------------------------------------------ No results found for: BNP   ---------------------------------------------------------------------------------------------------------------  Urinalysis    Component Value Date/Time   COLORURINE BROWN (A) 06/13/2020 1254   APPEARANCEUR TURBID (A) 06/13/2020 1254   APPEARANCEUR Cloudy (A) 09/13/2018 1414   LABSPEC 1.013 06/13/2020 1254   PHURINE 9.0 (H) 06/13/2020 1254   GLUCOSEU NEGATIVE 06/13/2020 1254   HGBUR MODERATE (A) 06/13/2020 1254   BILIRUBINUR NEGATIVE 06/13/2020 1254   BILIRUBINUR Negative 09/13/2018 1414   KETONESUR NEGATIVE 06/13/2020 1254   PROTEINUR 100 (A) 06/13/2020 1254   NITRITE NEGATIVE 06/13/2020 1254   LEUKOCYTESUR MODERATE (A) 06/13/2020 1254     ----------------------------------------------------------------------------------------------------------------   Imaging Results:    CT HEAD WO CONTRAST  Result Date: 06/13/2020 CLINICAL DATA:  Right arm paresis. EXAM: CT HEAD WITHOUT CONTRAST TECHNIQUE: Contiguous axial images were obtained from the base of the skull through the vertex without intravenous contrast. COMPARISON:  None. FINDINGS: Brain: Mild chronic ischemic white matter disease is noted. No mass effect or midline shift is noted. Ventricular size is within normal limits. There is no evidence of mass lesion, hemorrhage or acute infarction. Vascular: No hyperdense vessel or unexpected calcification. Skull: Normal. Negative for fracture or focal lesion. Sinuses/Orbits: No acute finding. Other: None. IMPRESSION: Mild chronic ischemic white matter disease. No acute intracranial abnormality seen. Electronically Signed   By: Marijo Conception M.D.   On: 06/13/2020 13:32    My personal review of EKG: Rhythm NSR, Rate  63/min, QTc 411, no Acute ST changes   Assessment & Plan:    Active Problems:   Ileal conduit stomal stenosis, subsequent encounter   Dementia (HCC)   Hyperlipidemia   HTN (hypertension)   H/o Paroxysmal SVT (supraventricular tachycardia) (HCC)   Focal neurological deficit   Right hand weakness -TIA vs Radial peripheral neuropathy(wrist drop) -CT head with no evidence of ischemic event, discussed with radiology, prelim reading of MRI with no acute ischemic event -Having isolated finding of right hand weakness, appears to be wristdrop, especially symptoms started after she woke up from sleep. -Will be admitted for TIA work-up, will obtain 2D echo, MRA head, Dopplers, 2D echo, lipid panel and A1c. -PT/OT. -Patient  will need to follow as an outpatient with neurology for EMG for complete work-up and diagnosis of wrist drop, discussed with Dr. Paulita Cradle, this can be done in his office(will need leave instructions for  SNF facility to arrange follow-up with Dr. Freddie Apley office)  Dementia -Continue with home medication  Prior Cystectomy with Uretero-ileaconduit -Urine analysis is positive, she received aspirin in ED, she is afebrile, no leukocytosis, will hold on starting IV antibiotics, will follow on urine cultures, will check procalcitonin  Hypertension -Continue with metoprolol, especially in the setting of history of paroxysmal SVT, with low clinical suspicion of TIA  History of paroxysmal SVT -Continue with metoprolol, monitoring telemetry  Diabetes mellitus -Hold Metformin, will keep on insulin sliding scale during hospital stay  DVT Prophylaxis Heparin   AM Labs Ordered, also please  review Full Orders  Family Communication: Admission, patients condition and plan of care including tests being ordered have been discussed with the patient and son [name] by phone who indicate understanding and agree with the plan and Code Status.  Code Status DNR confirmed by son by phone  Likely DC to  Back to [facility]  Condition GUARDED    Consults called: none    Admission status: observation    Time spent in minutes : 60 minutes   Phillips Climes M.D on 06/13/2020 at 3:21 PM   [facility] - Office  [phone]

## 2020-06-13 NOTE — ED Notes (Signed)
Pt transported to MRI 

## 2020-06-13 NOTE — ED Provider Notes (Signed)
[facility] EMERGENCY DEPARTMENT Provider Note   CSN: [id number] Arrival date & time: 06/13/20  1146     History Chief Complaint  Patient presents with  . Hand Problem    Theresa Hunt is a 87 y.o. female.  Pt presents to the ED today with right arm weakness.  Pt lives at an assisted living facility due to dementia.  Her son last saw her normal at 1400 yesterday.  The son came back to the facility today around 1000.  Pt's roommate told the son that patient was having trouble with her right arm.  The staff told the son patient woke up like this.  Pt said her arm feels numb.  The son said she has not gotten up to walk because she walks with a walker and could not use the walker today due to her arm weakness.  She denies any visual defects, she is able to speak and to swallow.  No neglect.  She is not on blood thinners.        Past Medical History:  Diagnosis Date  . Dementia ([place])   . Hyperlipidemia   . Hypertension   . Interstitial cystitis   . SVT (supraventricular tachycardia) [facility])     Patient Active Problem List   Diagnosis Date Noted  . Pressure injury of skin 02/10/2020  . Sepsis ([place]) 02/08/2020  . HTN (hypertension) 02/08/2020  . H/o Paroxysmal SVT (supraventricular tachycardia) ([place]) 02/08/2020  . Pressure injury of left buttock, stage 1 11/01/2019  . 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 Nephrolithiasis 04/06/2019  . History of removal of part of urinary tract 04/06/2019  . History of urinary tract infection 04/06/2019  . UTI (urinary tract infection) 10/09/2018  . Interstitial cystitis/ s/p Prior Cystectomy with Uretero-ileaconduit   . Dementia ([place]) 04/11/2015  . Essential hypertension with goal blood pressure less than 130/80 04/23/2014  . Memory loss 04/23/2014  . Hyperlipidemia 10/22/2013  . SVT (supraventricular tachycardia) ([place]) 09/06/2012    Past Surgical History:  Procedure Laterality Date  . ABDOMINAL HYSTERECTOMY     . CHOLECYSTECTOMY    . CYSTECTOMY W/ URETEROILEAL CONDUIT    . REVISION UROSTOMY CUTANEOUS       OB History   No obstetric history on file.     Family History  Problem Relation Age of Onset  . Emphysema Father   . CAD Mother 61  . Cancer Daughter        Breast    Social History   Tobacco Use  . Smoking status: Never Smoker  . Smokeless tobacco: Never Used  Vaping Use  . Vaping Use: Never used  Substance Use Topics  . Alcohol use: No  . Drug use: No    Home Medications Prior to Admission medications   Medication Sig Start Date End Date Taking? Authorizing Provider  ascorbic acid (VITAMIN C) 500 MG tablet Take 500 mg by mouth daily.   Yes [provider]  aspirin 81 MG tablet Take 81 mg by mouth daily.   Yes [provider]  Cholecalciferol (VITAMIN D3) 50 MCG (2000 UT) capsule Take 2,000 Units by mouth daily.    Yes [provider]  Control Gel Formula Dressing (DUODERM CGF DRESSING) MISC Apply 1 each topically every 3 (three) days. Size 2x2 will work better for area of skin 11/01/19  Yes Ivy Lynn, NP  loratadine (CLARITIN) 10 MG tablet Take 1 tablet by mouth daily.   Yes [provider]  memantine (  NAMENDA) 10 MG tablet Take 10 mg by mouth daily. Take 1/2 tablet by mouth daily 01/17/20  Yes [provider]  metFORMIN (GLUCOPHAGE) 500 MG tablet TAKE ONE (1) TABLET EACH DAY Patient taking differently: Take 500 mg by mouth daily with breakfast. 01/08/20  Yes [name], MD  metoprolol succinate (TOPROL-XL) 25 MG 24 hr tablet Take 3 tablets (75 mg total) by mouth in the morning, at noon, and at bedtime. Take with or immediately following a meal. 02/14/20  Yes Johnson, Clanford L, MD  nitrofurantoin, macrocrystal-monohydrate, (MACROBID) 100 MG capsule Take 100 mg by mouth 2 (two) times daily.   Yes [provider]  sertraline (ZOLOFT) 25 MG tablet Take 25 mg by mouth daily.   Yes [provider]  zinc  gluconate 50 MG tablet Take 50 mg by mouth daily.   Yes [provider]    Allergies    Codeine and Donepezil  Review of Systems   Review of Systems  Neurological: Positive for weakness and numbness.  All other systems reviewed and are negative.   Physical Exam Updated Vital Signs BP (!) 129/55   Pulse 62   Temp 97.8 F (36.6 C) (Oral)   Resp (!) 22   Ht 5\' 5"  (1.651 m)   Wt 64 kg   SpO2 98%   BMI 23.48 kg/m   Physical Exam Vitals and nursing note reviewed.  Constitutional:      Appearance: Normal appearance.  HENT:     Head: Normocephalic and atraumatic.     Right Ear: External ear normal.     Left Ear: External ear normal.     Nose: Nose normal.     Mouth/Throat:     Mouth: Mucous membranes are moist.     Pharynx: Oropharynx is clear.  Eyes:     Extraocular Movements: Extraocular movements intact.     Conjunctiva/sclera: Conjunctivae normal.     Pupils: Pupils are equal, round, and reactive to light.  Cardiovascular:     Rate and Rhythm: Normal rate and regular rhythm.     Pulses: Normal pulses.     Heart sounds: Normal heart sounds.  Pulmonary:     Effort: Pulmonary effort is normal.     Breath sounds: Normal breath sounds.  Abdominal:     General: Abdomen is flat. Bowel sounds are normal.     Palpations: Abdomen is soft.     Comments: Urostomy noted  Musculoskeletal:        General: Normal range of motion.     Cervical back: Normal range of motion and neck supple.  Skin:    General: Skin is warm.     Capillary Refill: Capillary refill takes less than 2 seconds.  Neurological:     Mental Status: She is alert. Mental status is at baseline.     Comments: Right arm weakness  Psychiatric:        Mood and Affect: Mood normal.        Behavior: Behavior normal.     ED Results / Procedures / Treatments   Labs (all labs ordered are listed, but only abnormal results are displayed) Labs Reviewed  COMPREHENSIVE METABOLIC PANEL - Abnormal; Notable  for the following components:      Result Value   Glucose, Bld 131 (*)    BUN 30 (*)    Calcium 10.5 (*)    Albumin 3.4 (*)    All other components within normal limits  URINALYSIS, ROUTINE W REFLEX MICROSCOPIC - Abnormal;  Notable for the following components:   Color, Urine BROWN (*)    APPearance TURBID (*)    pH 9.0 (*)    Hgb urine dipstick MODERATE (*)    Protein, ur 100 (*)    Leukocytes,Ua MODERATE (*)    RBC / HPF >50 (*)    WBC, UA >50 (*)    All other components within normal limits  CBG MONITORING, ED - Abnormal; Notable for the following components:   Glucose-Capillary 109 (*)    All other components within normal limits  RESP PANEL BY RT-PCR (FLU A&B, COVID) ARPGX2  URINE CULTURE  PROTIME-INR  APTT  CBC  DIFFERENTIAL  RAPID URINE DRUG SCREEN, HOSP PERFORMED  ETHANOL  I-STAT CHEM 8, ED    EKG EKG Interpretation  Date/Time:  Friday June 13 2020 12:21:06 EDT Ventricular Rate:  63 PR Interval:  170 QRS Duration: 87 QT Interval:  401 QTC Calculation: 411 R Axis:   65 Text Interpretation: Sinus rhythm Low voltage, extremity leads Probable anteroseptal infarct, old No significant change since last tracing Confirmed by Isla Pence [phone]) on 06/13/2020 12:23:09 PM   Radiology CT HEAD WO CONTRAST  Result Date: 06/13/2020 CLINICAL DATA:  Right arm paresis. EXAM: CT HEAD WITHOUT CONTRAST TECHNIQUE: Contiguous axial images were obtained from the base of the skull through the vertex without intravenous contrast. COMPARISON:  None. FINDINGS: Brain: Mild chronic ischemic white matter disease is noted. No mass effect or midline shift is noted. Ventricular size is within normal limits. There is no evidence of mass lesion, hemorrhage or acute infarction. Vascular: No hyperdense vessel or unexpected calcification. Skull: Normal. Negative for fracture or focal lesion. Sinuses/Orbits: No acute finding. Other: None. IMPRESSION: Mild chronic ischemic white matter disease. No  acute intracranial abnormality seen. Electronically Signed   By: Marijo Conception M.D.   On: 06/13/2020 13:32    Procedures Procedures   Medications Ordered in ED Medications  sodium chloride 0.9 % bolus 500 mL (500 mLs Intravenous New Bag/Given 06/13/20 1300)    Followed by  0.9 %  sodium chloride infusion (has no administration in time range)  cefTRIAXone (ROCEPHIN) 1 g in sodium chloride 0.9 % 100 mL IVPB (1 g Intravenous New Bag/Given 06/13/20 1507)  aspirin tablet 325 mg (has no administration in time range)  fluconazole (DIFLUCAN) tablet 150 mg (150 mg Oral Given 06/13/20 1512)    ED Course  I have reviewed the triage vital signs and the nursing notes.  Pertinent labs & imaging results that were available during my care of the patient were reviewed by me and considered in my medical decision making (see chart for details).    MDM Rules/Calculators/A&P                          CT head without anything acute.  MRI ordered  Pt has a urostomy with a ua that looks dirty.  It also has some yeast in it.  Last ua from the facility looked clean.  Pt has been on macrobid chronically.  Urine sent for culture and rocephin started and diflucan given.  I have tried to contact neurology, but we are having a difficult time getting a hold of the on call provider.   Pt d/w Dr. Waldron [facility] ([facility]) who will admit. Final Clinical Impression(s) / ED Diagnoses Final diagnoses:  Cerebrovascular accident (CVA), unspecified mechanism ([place])  Acute cystitis with hematuria    Rx / DC Orders ED Discharge Orders  None       Isla Pence, MD 06/13/20 [phone]

## 2020-06-13 NOTE — ED Triage Notes (Signed)
Pt brought in by her son from [facility].  Pt not able to use right arm, LKW yesterday at 1400.  Dr Gilford Raid called and to bedside immediately.

## 2020-06-14 ENCOUNTER — Observation Stay (HOSPITAL_BASED_OUTPATIENT_CLINIC_OR_DEPARTMENT_OTHER): Payer: Medicare HMO

## 2020-06-14 DIAGNOSIS — Z743 Need for continuous supervision: Secondary | ICD-10-CM | POA: Diagnosis not present

## 2020-06-14 DIAGNOSIS — R0902 Hypoxemia: Secondary | ICD-10-CM | POA: Diagnosis not present

## 2020-06-14 DIAGNOSIS — G301 Alzheimer's disease with late onset: Secondary | ICD-10-CM | POA: Diagnosis not present

## 2020-06-14 DIAGNOSIS — E785 Hyperlipidemia, unspecified: Secondary | ICD-10-CM | POA: Diagnosis not present

## 2020-06-14 DIAGNOSIS — I10 Essential (primary) hypertension: Secondary | ICD-10-CM

## 2020-06-14 DIAGNOSIS — Z7401 Bed confinement status: Secondary | ICD-10-CM | POA: Diagnosis not present

## 2020-06-14 DIAGNOSIS — F0280 Dementia in other diseases classified elsewhere without behavioral disturbance: Secondary | ICD-10-CM

## 2020-06-14 DIAGNOSIS — R404 Transient alteration of awareness: Secondary | ICD-10-CM | POA: Diagnosis not present

## 2020-06-14 DIAGNOSIS — R29818 Other symptoms and signs involving the nervous system: Secondary | ICD-10-CM

## 2020-06-14 DIAGNOSIS — R531 Weakness: Secondary | ICD-10-CM | POA: Diagnosis not present

## 2020-06-14 DIAGNOSIS — R69 Illness, unspecified: Secondary | ICD-10-CM | POA: Diagnosis not present

## 2020-06-14 DIAGNOSIS — G459 Transient cerebral ischemic attack, unspecified: Secondary | ICD-10-CM

## 2020-06-14 LAB — COMPREHENSIVE METABOLIC PANEL
ALT: 13 U/L (ref 0–44)
AST: 20 U/L (ref 15–41)
Albumin: 3.2 g/dL — ABNORMAL LOW (ref 3.5–5.0)
Alkaline Phosphatase: 81 U/L (ref 38–126)
Anion gap: 10 (ref 5–15)
BUN: 24 mg/dL — ABNORMAL HIGH (ref 8–23)
CO2: 26 mmol/L (ref 22–32)
Calcium: 10 mg/dL (ref 8.9–10.3)
Chloride: 104 mmol/L (ref 98–111)
Creatinine, Ser: 0.54 mg/dL (ref 0.44–1.00)
GFR, Estimated: >60 mL/min (ref >60)
Glucose, Bld: 99 mg/dL (ref 70–99)
Potassium: 4 mmol/L (ref 3.5–5.1)
Sodium: 140 mmol/L (ref 135–145)
Total Bilirubin: 0.6 mg/dL (ref 0.3–1.2)
Total Protein: 6.1 g/dL — ABNORMAL LOW (ref 6.5–8.1)

## 2020-06-14 LAB — CBC
HCT: 38.8 % (ref 36.0–46.0)
Hemoglobin: 12.4 g/dL (ref 12.0–15.0)
MCH: 30.4 pg (ref 26.0–34.0)
MCHC: 32 g/dL (ref 30.0–36.0)
MCV: 95.1 fL (ref 80.0–100.0)
Platelets: 162 10*3/uL (ref 150–400)
RBC: 4.08 MIL/uL (ref 3.87–5.11)
RDW: 13.2 % (ref 11.5–15.5)
WBC: 6.7 10*3/uL (ref 4.0–10.5)
nRBC: 0 % (ref 0.0–0.2)

## 2020-06-14 LAB — ECHOCARDIOGRAM COMPLETE
Area-P 1/2: 2.2 cm2
Height: 65 in
S' Lateral: 1.96 cm
Weight: 1975.32 oz

## 2020-06-14 LAB — GLUCOSE, CAPILLARY
Glucose-Capillary: 107 mg/dL — ABNORMAL HIGH (ref 70–99)
Glucose-Capillary: 112 mg/dL — ABNORMAL HIGH (ref 70–99)
Glucose-Capillary: 125 mg/dL — ABNORMAL HIGH (ref 70–99)
Glucose-Capillary: 98 mg/dL (ref 70–99)

## 2020-06-14 LAB — LIPID PANEL
Cholesterol: 231 mg/dL — ABNORMAL HIGH (ref 0–200)
HDL: 40 mg/dL — ABNORMAL LOW (ref >40)
LDL Cholesterol: 166 mg/dL — ABNORMAL HIGH (ref 0–99)
Total CHOL/HDL Ratio: 5.8 RATIO
Triglycerides: 126 mg/dL (ref <150)
VLDL: 25 mg/dL (ref 0–40)

## 2020-06-14 LAB — HEMOGLOBIN A1C
Hgb A1c MFr Bld: 7.1 % — ABNORMAL HIGH (ref 4.8–5.6)
Mean Plasma Glucose: 157.07 mg/dL

## 2020-06-14 MED ORDER — ASPIRIN 81 MG PO CHEW
324.0000 mg | CHEWABLE_TABLET | Freq: Every day | ORAL | Status: DC
  Administered 2020-06-14: 324 mg via ORAL
  Filled 2020-06-14: qty 4

## 2020-06-14 MED ORDER — ASPIRIN 81 MG PO CHEW: 324.0000 mg | CHEWABLE_TABLET | Freq: Every day | ORAL | 3 refills | Status: AC

## 2020-06-14 NOTE — TOC Transition Note (Signed)
Transition of Care [facility]) - CM/SW Discharge Note   Patient Details  Name: Theresa Hunt MRN: [id number] Date of Birth: [birth year]  [facility] CM/SW Contact:  Natasha Bence, LCSW Phone Number: 06/14/2020, 1:38 PM   Clinical Narrative:    CSW notified of patient's readiness for d/c. Ebony Hail with [facility] agreeable to to take patient. CSW notified Ebony Hail of patient's for follow up neurology appointment as stated in the d/c summary. CSW faxed d/c summary and transfer report. CSW also completed med necessity and called EMS. Nurse to call report. TOC signing off.     Final next level of care: Skilled Nursing Facility Barriers to Discharge: Barriers Resolved   Patient Goals and CMS Choice Patient states their goals for this hospitalization and ongoing recovery are:: Return to SNF CMS Medicare.gov Compare Post Acute Care list provided to:: Patient Choice offered to / list presented to : Patient  Discharge Placement                Patient to be transferred to facility by: [facility] Name of family member notified: Joya San Patient and family notified of of transfer: 06/14/20  Discharge Plan and Services                DME Arranged: N/A DME Agency: NA       HH Arranged: NA HH Agency: NA        Social Determinants of Health (SDOH) Interventions     Readmission Risk Interventions No flowsheet data found.

## 2020-06-14 NOTE — Plan of Care (Signed)
  Problem: Acute Rehab PT Goals(only PT should resolve) Goal: Patient Will Transfer Sit To/From Stand Outcome: Progressing Flowsheets (Taken 06/14/2020 1147) Patient will transfer sit to/from stand: with min guard assist Goal: Pt Will Transfer Bed To Chair/Chair To Bed Outcome: Progressing Flowsheets (Taken 06/14/2020 1147) Pt will Transfer Bed to Chair/Chair to Bed: min guard assist Goal: Pt Will Ambulate Outcome: Progressing Flowsheets (Taken 06/14/2020 1147) Pt will Ambulate:  50 feet  with least restrictive assistive device  with min guard assist   11:47 AM, 06/14/20 Mearl Latin PT, DPT Physical Therapist at [facility]

## 2020-06-14 NOTE — Progress Notes (Signed)
[facility] transferred [facility] via EMS. IV removed. Discharge paper work placed in packet for receiving facility.

## 2020-06-14 NOTE — Discharge Summary (Signed)
Physician Discharge Summary [facility]    Patient: Theresa Hunt                   Admit date: 06/13/2020   DOB: 27-Jul-1929             Discharge date:06/14/2020/9:58 AM XVQ:[id number]                          PCP: Theresa Fraise, MD  Disposition: HOME / SNF / HOME with Home Health   Recommendations for Outpatient Follow-up:   . Follow up: in 1 week . Need outpatient with neurology for EMG for complete work-up and diagnosis of wrist drop, discussed with Dr. Paulita Hunt,   Discharge Condition: Stable   Code Status:   Code Status: Full Code  Diet recommendation: Regular healthy diet   Discharge Diagnoses:    Active Problems:   Ileal conduit stomal stenosis, subsequent encounter   Dementia (HCC)   Hyperlipidemia   HTN (hypertension)   H/o Paroxysmal SVT (supraventricular tachycardia) (HCC)   Focal neurological deficit   History of Present Illness/ Hospital Course Theresa Hunt Summary:   Theresa Hunt  is a 87 y.o. female, with past medical history relevant forof dementia,Interstitial cystitis/ s/p Prior Cystectomy with Uretero-ileaconduity,HLD, HTN, history of paroxysmal SVT, and diabetes mellitus presenting to the ED with right arm weakness, patient lives at assisted living facility due to dementia, her son last saw her normal yesterday at 2 PM, he came back to see her this morning around 10 AM, patient's roommate told son that she is having trouble with her right arm,, the staff told the son that patient woke up like this, and said her right arm feels numb, with tingling, she denies any other focal deficits, any visual deficits, reports she is able to speak and swallow, no neglect, she is only on aspirin, no other blood thinners the son reports that patient has not getting up to walk because she walks with a walker, and could not use the walker today due to her arm weakness. -In ED her exam noted for right hand weakness, CT head with no acute findings, she received full dose aspirin in  ED Alysis was positive, which is usually the case given she is having urostomy bag, she is afebrile, with no leukocytosis, [facility] consulted to admit. -------------------------------------------------------------------------------------------------------------------------------------   Right hand weakness -TIA vs CVA was ruled out, CT of the head, MRI of the brain, MRA were all reviewed negative for any specific findings.  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 -Imaging all reviewed including MRI of the head, bilateral carotid ultrasound-occlusion -Continue full dose aspirin -PT/OT. -Patient  will need to follow as an outpatient with neurology for EMG for complete work-up and diagnosis of wrist drop, discussed with Dr. Paulita Hunt, this can be done in his office(will  leave instructions for SNF facility to arrange follow-up with Dr. Freddie Hunt office)    Dementia -Continue with home medication  Prior Cystectomy with Uretero-ileaconduit -Urine analysis is positive, she received aspirin in ED, she is afebrile, no leukocytosis, will hold on starting IV antibiotics, will follow on urine cultures, will check procalcitonin  Hypertension -Continue with metoprolol, especially in the setting of history  of paroxysmal SVT, with low clinical suspicion of TIA  History of paroxysmal SVT -Continue with metoprolol, monitoring telemetry  Diabetes mellitus -Hold Metformin, will keep on insulin sliding scale during hospital stay  DVT Prophylaxis Heparin   AM Labs Ordered, also please review Full Orders  Family Communication: Admission, patients condition and plan of care including tests  being ordered have been discussed with the patient and son Theresa Hunt by phone who indicate understanding and agree with the plan and Code Status.  Code Status DNR confirmed by son by phone   SKIN assessment: I agree with skin assessment and plan as outlined below: Pressure Injury 02/09/20 Buttocks Left Stage 2 -  Partial thickness loss of dermis presenting as a shallow open injury with a red, pink wound bed without slough. opening to left inner buttocks. area with pink center. dry peri wound w purple surrounding t (Active)  02/09/20 1621  Location: Buttocks  Location Orientation: Left  Staging: Stage 2 -  Partial thickness loss of dermis presenting as a shallow open injury with a red, pink wound bed without slough.  Wound Description (Comments): opening to left inner buttocks. area with pink center. dry peri wound w purple surrounding tissue  Present on Admission: Yes     Pressure Injury 06/13/20 Buttocks Mid Stage 1 -  Intact skin with non-blanchable redness of a localized area usually over a bony prominence. redness to sacra area (Active)  06/13/20 1215  Location: Buttocks  Location Orientation: Mid  Staging: Stage 1 -  Intact skin with non-blanchable redness of a localized area usually over a bony prominence.  Wound Description (Comments): redness to sacra area  Present on Admission: Yes    Risk of unplanned readmission Score:     Discharge Instructions:   Discharge Instructions    Activity as tolerated - No restrictions   Complete by: As directed    Diet - low sodium heart healthy   Complete by: As directed    Discharge instructions   Complete by: As directed    -Patient  will need to follow as an outpatient with neurology for EMG for complete work-up and diagnosis of wrist drop, discussed with Dr. Paulita Hunt, this can be done in his office(will need leave instructions for SNF facility to arrange follow-up with Dr. Freddie Hunt office) -Needs aggressive PT for the wristdrop    Discharge wound care:   Complete by: As directed    Per nursing instructions   Increase activity slowly   Complete by: As directed        Medication List    STOP taking these medications   aspirin 81 MG tablet Replaced by: aspirin 81 MG chewable tablet     TAKE these medications   ascorbic acid 500 MG tablet Commonly known as: VITAMIN C Take 500 mg by mouth daily.   aspirin 81 MG chewable tablet Chew 4 tablets (324 mg total) by mouth daily. Start taking on: June 15, 2020 Replaces: aspirin 81 MG tablet   DuoDERM CGF Dressing Misc Apply 1 each topically every 3 (three) days. Size 2x2 will work better for area of skin   loratadine 10 MG tablet Commonly known as: CLARITIN Take 1 tablet by mouth daily.   memantine 10 MG tablet Commonly known as: NAMENDA Take 10 mg by mouth daily. Take 1/2 tablet by mouth daily   metFORMIN 500 MG tablet Commonly known as: GLUCOPHAGE TAKE ONE (1) TABLET EACH DAY What changed: See the new instructions.   metoprolol succinate 25 MG 24  hr tablet Commonly known as: TOPROL-XL Take 3 tablets (75 mg total) by mouth in the morning, at noon, and at bedtime. Take with or immediately following a meal.   nitrofurantoin (macrocrystal-monohydrate) 100 MG capsule Commonly known as: MACROBID Take 100 mg by mouth 2 (two) times daily.   sertraline 25 MG tablet Commonly known as: ZOLOFT Take 25 mg by mouth daily.   Vitamin D3 50 MCG (2000 UT) capsule Take 2,000 Units by mouth daily.   zinc gluconate 50 MG tablet Take 50 mg by mouth daily.       Allergies  Allergen Reactions  . Codeine Other (See Comments)    Pt. does not remember  . Donepezil Other (See Comments)    hallucinations     Procedures /Studies:   CT HEAD WO CONTRAST  Result Date: 06/13/2020 CLINICAL DATA:  Right arm paresis. EXAM: CT HEAD WITHOUT CONTRAST TECHNIQUE: Contiguous axial images were obtained from the base of the skull through the vertex without intravenous  contrast. COMPARISON:  None. FINDINGS: Brain: Mild chronic ischemic white matter disease is noted. No mass effect or midline shift is noted. Ventricular size is within normal limits. There is no evidence of mass lesion, hemorrhage or acute infarction. Vascular: No hyperdense vessel or unexpected calcification. Skull: Normal. Negative for fracture or focal lesion. Sinuses/Orbits: No acute finding. Other: None. IMPRESSION: Mild chronic ischemic white matter disease. No acute intracranial abnormality seen. Electronically Signed   By: Marijo Conception M.D.   On: 06/13/2020 13:32   MR ANGIO HEAD WO CONTRAST  Result Date: 06/13/2020 CLINICAL DATA:  TIA. EXAM: MRA HEAD WITHOUT CONTRAST TECHNIQUE: Angiographic images of the Circle of Willis were obtained using MRA technique without intravenous contrast. COMPARISON:  MR head without contrast from CT head without contrast 06/13/20 FINDINGS: Study is severely degraded by patient motion. Lower portion of the study demonstrates no vessel occlusion through the cavernous segments. Signal loss in the distal M1 segments is likely artifactual. There is some flow visualized within branch vessels. Vertebral arteries are codominant. Basilar artery demonstrates flow. Significant signal loss is present in the proximal PCA vessels bilaterally. IMPRESSION: 1. Severely motion degraded exam. 2. No definite large vessel occlusion. Study is nondiagnostic for stenosis. Electronically Signed   By: San Morelle M.D.   On: 06/13/2020 17:39   MR BRAIN WO CONTRAST  Result Date: 06/13/2020 CLINICAL DATA:  Right-sided weakness EXAM: MRI HEAD WITHOUT CONTRAST TECHNIQUE: Multiplanar, multiecho pulse sequences of the brain and surrounding structures were obtained without intravenous contrast. COMPARISON:  None. FINDINGS: Motion artifact is present. Findings below are within this limitation. Brain: There is no acute infarction or intracranial hemorrhage. There is no intracranial mass, mass  effect, or edema. There is no extra-axial fluid collection. Prominence of the ventricles and sulci reflects generalized parenchymal volume loss. Relative prominence of the lateral ventricles, particularly the temporal horns is favored to be on an ex vacuo basis rather than hydrocephalus. Patchy foci of T2 hyperintensity in the supratentorial white matter are nonspecific but may reflect chronic microvascular ischemic changes. Vascular: Major vessel flow voids at the skull base are preserved. Skull and upper cervical spine: Normal marrow signal is preserved. Sinuses/Orbits: Paranasal sinuses are aerated. Orbits are unremarkable. Other: Sella is unremarkable.  Mastoid air cells are clear. IMPRESSION: Significant motion artifact is present. There is no acute infarction. Electronically Signed   By: Macy Mis M.D.   On: 06/13/2020 15:45   US Carotid Bilateral (at [facility] and [facility] only)  Result Date: 06/13/2020  CLINICAL DATA:  87 year old female with a history of TIA/stroke EXAM: BILATERAL CAROTID DUPLEX ULTRASOUND TECHNIQUE: Pearline Cables scale imaging, color Doppler and duplex ultrasound were performed of bilateral carotid and vertebral arteries in the neck. COMPARISON:  06/23/2015 FINDINGS: Criteria: Quantification of carotid stenosis is based on velocity parameters that correlate the residual internal carotid diameter with NASCET-based stenosis levels, using the diameter of the distal internal carotid lumen as the denominator for stenosis measurement. The following velocity measurements were obtained: RIGHT ICA:  Systolic 98 cm/sec, Diastolic 12 cm/sec CCA:  66 cm/sec SYSTOLIC ICA/CCA RATIO:  1.5 ECA:  75 cm/sec LEFT ICA:  Systolic 64 cm/sec, Diastolic 13 cm/sec CCA:  63 cm/sec SYSTOLIC ICA/CCA RATIO:  1.0 ECA:  93 cm/sec Right Brachial SBP: Not acquired Left Brachial SBP: Not acquired RIGHT CAROTID ARTERY: No significant calcifications of the right common carotid artery. Intermediate waveform maintained. Heterogeneous and  partially calcified plaque at the right carotid bifurcation. No significant lumen shadowing. Low resistance waveform of the right ICA. Tortuous RIGHT VERTEBRAL ARTERY: Antegrade flow with low resistance waveform. LEFT CAROTID ARTERY: No significant calcifications of the left common carotid artery. Intermediate waveform maintained. Heterogeneous and partially calcified plaque at the left carotid bifurcation without significant lumen shadowing. Low resistance waveform of the left ICA. Tortuous LEFT VERTEBRAL ARTERY:  Antegrade flow with low resistance waveform. IMPRESSION: Color duplex indicates minimal heterogeneous and calcified plaque, with no hemodynamically significant stenosis by duplex criteria in the extracranial cerebrovascular circulation. Signed, Dulcy Fanny. Dellia Nims, RPVI [facility] Radiology Electronically Signed   By: Corrie Mckusick D.O.   On: 06/13/2020 16:57     Subjective:   Patient was seen and examined 06/14/2020, 9:58 AM Patient stable today. No acute distress.  No issues overnight Stable for discharge.  Discharge Exam:    Vitals:   06/13/20 2003 06/13/20 2145 06/14/20 0438 06/14/20 0947  BP: (!) 139/57 (!) 164/63 (!) 122/48 (!) 162/80  Pulse: 70 69 71 98  Resp: 15 20 18    Temp: 98 F (36.7 C) 97.7 F (36.5 C) (!) 97.1 F (36.2 C)   TempSrc: Oral Oral    SpO2: 96% 99% 98%   Weight:  56 kg    Height:  5\' 5"  (1.651 m)      General: Severely demented but pleasant female  pt lying comfortably in bed & appears in no obvious distress. Cardiovascular: S1 & S2 heard, RRR, S1/S2 +. No murmurs, rubs, gallops or clicks. No JVD or pedal edema. Respiratory: Clear to auscultation without wheezing, rhonchi or crackles. No increased work of breathing. Abdominal:  Non-distended, non-tender & soft. No organomegaly or masses appreciated. Normal bowel sounds heard. CNS: Alert and oriented. No focal deficits. Extremities: no edema, no  cyanosis... Obvious wrist drop on the right noted, patient cannot extend her hand.. Negative for any sensory loss, range of motion all intact in other joints, strength intact Pressure Injury 02/09/20 Buttocks Left Stage 2 -  Partial thickness loss of dermis presenting as a shallow open injury with a red, pink wound bed without slough. opening to left inner buttocks. area with pink center. dry peri wound w purple surrounding t (Active)  02/09/20 1621  Location: Buttocks  Location Orientation: Left  Staging: Stage 2 -  Partial thickness loss of dermis presenting as a shallow open injury with a red, pink wound bed without slough.  Wound Description (Comments): opening to left inner buttocks. area with pink center. dry peri wound w purple surrounding tissue  Present  on Admission: Yes     Pressure Injury 06/13/20 Buttocks Mid Stage 1 -  Intact skin with non-blanchable redness of a localized area usually over a bony prominence. redness to sacra area (Active)  06/13/20 1215  Location: Buttocks  Location Orientation: Mid  Staging: Stage 1 -  Intact skin with non-blanchable redness of a localized area usually over a bony prominence.  Wound Description (Comments): redness to sacra area  Present on Admission: Yes      The results of significant diagnostics from this hospitalization (including imaging, microbiology, ancillary and laboratory) are listed below for reference.      Microbiology:   Recent Results (from the past 240 hour(s))  Resp Panel by RT-PCR (Flu A&B, Covid) Nasopharyngeal Swab     Status: None   Collection Time: 06/13/20 12:58 PM   Specimen: Nasopharyngeal Swab; Nasopharyngeal(NP) swabs in vial transport medium  Result Value Ref Range Status   SARS Coronavirus 2 by RT PCR NEGATIVE NEGATIVE Final    Comment: (NOTE) SARS-CoV-2 target nucleic acids are NOT DETECTED.  The SARS-CoV-2 RNA is generally detectable in upper respiratory specimens during the acute phase of infection.  The lowest concentration of SARS-CoV-2 viral copies this assay can detect is 138 copies/mL. A negative result does not preclude SARS-Cov-2 infection and should not be used as the sole basis for treatment or other patient management decisions. A negative result may occur with  improper specimen collection/handling, submission of specimen other than nasopharyngeal swab, presence of viral mutation(s) within the areas targeted by this assay, and inadequate number of viral copies(<138 copies/mL). A negative result must be combined with clinical observations, patient history, and epidemiological information. The expected result is Negative.  Fact Sheet for Patients:  EntrepreneurPulse.com.au  Fact Sheet for Healthcare Providers:  IncredibleEmployment.be  This test is no t yet approved or cleared by the [place] FDA and  has been authorized for detection and/or diagnosis of SARS-CoV-2 by FDA under an Emergency Use Authorization (EUA). This EUA will remain  in effect (meaning this test can be used) for the duration of the COVID-19 declaration under Section 564(b)(1) of the Act, 21 U.S.C.section 360bbb-3(b)(1), unless the authorization is terminated  or revoked sooner.       Influenza A by PCR NEGATIVE NEGATIVE Final   Influenza B by PCR NEGATIVE NEGATIVE Final    Comment: (NOTE) The Xpert Xpress SARS-CoV-2/FLU/RSV plus assay is intended as an aid in the diagnosis of influenza from Nasopharyngeal swab specimens and should not be used as a sole basis for treatment. Nasal washings and aspirates are unacceptable for Xpert Xpress SARS-CoV-2/FLU/RSV testing.  Fact Sheet for Patients: EntrepreneurPulse.com.au  Fact Sheet for Healthcare Providers: IncredibleEmployment.be  This test is not yet approved or cleared by the [place] FDA and has been authorized for detection and/or diagnosis of SARS-CoV-2 by FDA  under an Emergency Use Authorization (EUA). This EUA will remain in effect (meaning this test can be used) for the duration of the COVID-19 declaration under Section 564(b)(1) of the Act, 21 U.S.C. section 360bbb-3(b)(1), unless the authorization is terminated or revoked.  Performed at [facility], [address], [place], [place] [address]      Labs:   CBC: Recent Labs  Lab 06/13/20 1212 06/14/20 0602  WBC 6.7 6.7  NEUTROABS 4.5  --   HGB 13.1 12.4  HCT 41.3 38.8  MCV 96.7 95.1  PLT 162 570   Basic Metabolic Panel: Recent Labs  Lab 06/13/20 1212 06/14/20 0602  NA 135 140  K 4.5 4.0  CL 101 104  CO2 24 26  GLUCOSE 131* 99  BUN 30* 24*  CREATININE 0.71 0.54  CALCIUM 10.5* 10.0   Liver Function Tests: Recent Labs  Lab 06/13/20 1212 06/14/20 0602  AST 25 20  ALT 16 13  ALKPHOS 88 81  BILITOT 0.6 0.6  PROT 7.0 6.1*  ALBUMIN 3.4* 3.2*   BNP (last 3 results) No results for input(s): BNP in the last 8760 hours. Cardiac Enzymes: No results for input(s): CKTOTAL, CKMB, CKMBINDEX, TROPONINI in the last 168 hours. CBG: Recent Labs  Lab 06/13/20 1210 06/13/20 1801 06/14/20 0212 06/14/20 0752  GLUCAP 109* 95 107* 112*   Hgb A1c Recent Labs    06/13/20 1212  HGBA1C 6.8*   Lipid Profile Recent Labs    06/14/20 0602  CHOL 231*  HDL 40*  LDLCALC 166*  TRIG 126  CHOLHDL 5.8   Thyroid function studies No results for input(s): TSH, T4TOTAL, T3FREE, THYROIDAB in the last 72 hours.  Invalid input(s): FREET3 Anemia work up No results for input(s): VITAMINB12, FOLATE, FERRITIN, TIBC, IRON, RETICCTPCT in the last 72 hours. Urinalysis    Component Value Date/Time   COLORURINE BROWN (A) 06/13/2020 1254   APPEARANCEUR TURBID (A) 06/13/2020 1254   APPEARANCEUR Cloudy (A) 09/13/2018 1414   LABSPEC 1.013 06/13/2020 1254   PHURINE 9.0 (H) 06/13/2020 1254   GLUCOSEU NEGATIVE 06/13/2020 1254   HGBUR MODERATE (A) 06/13/2020 1254   BILIRUBINUR NEGATIVE  06/13/2020 1254   BILIRUBINUR Negative 09/13/2018 1414   KETONESUR NEGATIVE 06/13/2020 1254   PROTEINUR 100 (A) 06/13/2020 1254   NITRITE NEGATIVE 06/13/2020 1254   LEUKOCYTESUR MODERATE (A) 06/13/2020 1254   Pressure Injury 02/09/20 Buttocks Left Stage 2 -  Partial thickness loss of dermis presenting as a shallow open injury with a red, pink wound bed without slough. opening to left inner buttocks. area with pink center. dry peri wound w purple surrounding t (Active)  02/09/20 1621  Location: Buttocks  Location Orientation: Left  Staging: Stage 2 -  Partial thickness loss of dermis presenting as a shallow open injury with a red, pink wound bed without slough.  Wound Description (Comments): opening to left inner buttocks. area with pink center. dry peri wound w purple surrounding tissue  Present on Admission: Yes     Pressure Injury 06/13/20 Buttocks Mid Stage 1 -  Intact skin with non-blanchable redness of a localized area usually over a bony prominence. redness to sacra area (Active)  06/13/20 1215  Location: Buttocks  Location Orientation: Mid  Staging: Stage 1 -  Intact skin with non-blanchable redness of a localized area usually over a bony prominence.  Wound Description (Comments): redness to sacra area  Present on Admission: Yes       Time coordinating discharge: Over 45 minutes  SIGNED: Deatra James, MD, FACP, [facility]. [facility],  Please use [email or web address] to Page If 7PM-7AM, please contact night-coverage [email or web address] [email or web address] [facility] 06/14/2020, 9:58 AM

## 2020-06-14 NOTE — Evaluation (Signed)
Physical Therapy Evaluation Patient Details Name: Theresa Hunt MRN: [id number] DOB: 06/07/1929 Today's Date: 06/14/2020   History of Present Illness  Theresa Hunt  is a 87 y.o. female, with past medical history relevant for of dementia, Interstitial cystitis/ s/p Prior Cystectomy with Uretero-ileaconduity, HLD, HTN, history of paroxysmal SVT, and diabetes mellitus presenting to the ED with right arm weakness, patient lives at assisted living facility due to dementia, her son last saw her normal yesterday at 2 PM, he came back to see her this morning around 10 AM, patient's roommate told son that she is having trouble with her right arm,, the staff told the son that patient woke up like this, and said her right arm feels numb, with tingling, she denies any other focal deficits, any visual deficits, reports she is able to speak and swallow, no neglect, she is only on aspirin, no other blood thinners the son reports that patient has not getting up to walk because she walks with a walker, and could not use the walker today due to her arm weakness.     Clinical Impression  Patient functioning near  baseline for functional mobility and gait but is limited by impaired strength and balance. Patient with reduced R hand grip strength and presents with wrist drop. Patient completes all mobility today with slow, labored movements and min assist for strength and balance deficits. Patient ambulates with decreased cadence with some unsteadiness but no loss of balance. Patient returned to bed at end of session. Patient will benefit from continued physical therapy in hospital and recommended venue below to increase strength, balance, endurance for safe ADLs and gait.      Follow Up Recommendations Home health PT    Equipment Recommendations  None recommended by PT    Recommendations for Other Services       Precautions / Restrictions Precautions Precautions: Fall Restrictions Weight Bearing Restrictions: No       Mobility  Bed Mobility Overal bed mobility: Needs Assistance Bed Mobility: Supine to Sit;Sit to Supine     Supine to sit: Min assist Sit to supine: Min assist   General bed mobility comments: slow, labored transition to seated EOB    Transfers Overall transfer level: Needs assistance Equipment used: Rolling walker (2 wheeled) Transfers: Sit to/from [facility] Sit to Stand: Min assist Stand pivot transfers: Min assist       General transfer comment: Min A and RW to transfer to standing with cueing for sequencing  Ambulation/Gait Ambulation/Gait assistance: Min assist Gait Distance (Feet): 25 Feet Assistive device: Rolling walker (2 wheeled) Gait Pattern/deviations: Step-through pattern;Decreased step length - left;Decreased stance time - right Gait velocity: decreased   General Gait Details: slow, labored cadence with RW; unsteady without loss of balance  Stairs            Wheelchair Mobility    Modified Rankin (Stroke Patients Only)       Balance Overall balance assessment: Needs assistance Sitting-balance support: No upper extremity supported Sitting balance-Leahy Scale: Good Sitting balance - Comments: seated EOB   Standing balance support: Bilateral upper extremity supported Standing balance-Leahy Scale: Fair Standing balance comment: with RW                             Pertinent Vitals/Pain Pain Assessment: No/denies pain    Home Living Family/patient expects to be discharged to:: Assisted living  Prior Function     Gait / Transfers Assistance Needed: ambulates with RW  ADL's / Homemaking Assistance Needed: Assisted at facility        Hand Dominance        Extremity/Trunk Assessment   Upper Extremity Assessment Upper Extremity Assessment: RUE deficits/detail RUE Deficits / Details: decreased grip strength, wrist drop    Lower Extremity Assessment Lower Extremity  Assessment: Generalized weakness       Communication   Communication: No difficulties  Cognition Arousal/Alertness: Awake/alert Behavior During Therapy: WFL for tasks assessed/performed Overall Cognitive Status: Within Functional Limits for tasks assessed                                        General Comments      Exercises     Assessment/Plan    PT Assessment All further PT needs can be met in the next venue of care  PT Problem List Decreased strength;Decreased activity tolerance;Decreased balance       PT Treatment Interventions DME instruction;Balance training;Gait training;Neuromuscular re-education;Stair training;Functional mobility training;Patient/family education;Therapeutic activities;Therapeutic exercise    PT Goals (Current goals can be found in the Care Plan section)  Acute Rehab PT Goals Patient Stated Goal: return home PT Goal Formulation: With patient Time For Goal Achievement: 06/21/20 Potential to Achieve Goals: Good    Frequency Min 3X/week   Barriers to discharge        Co-evaluation               AM-PAC PT "6 Clicks" Mobility  Outcome Measure Help needed turning from your back to your side while in a flat bed without using bedrails?: None Help needed moving from lying on your back to sitting on the side of a flat bed without using bedrails?: A Little Help needed moving to and from a bed to a chair (including a wheelchair)?: A Little Help needed standing up from a chair using your arms (e.g., wheelchair or bedside chair)?: A Little Help needed to walk in hospital room?: A Little Help needed climbing 3-5 steps with a railing? : A Lot 6 Click Score: 18    End of Session Equipment Utilized During Treatment: Gait belt Activity Tolerance: Patient tolerated treatment well Patient left: in bed;with call bell/phone within reach;with bed alarm set;with family/visitor present Nurse Communication: Mobility status PT Visit  Diagnosis: Unsteadiness on feet (R26.81);Other abnormalities of gait and mobility (R26.89);Muscle weakness (generalized) (M62.81);Other symptoms and signs involving the nervous system (R29.898)    Time: 1314-3888 PT Time Calculation (min) (ACUTE ONLY): 20 min   Charges:   PT Evaluation $PT Eval Low Complexity: 1 Low PT Treatments $Therapeutic Activity: 8-22 mins        11:47 AM, 06/14/20 Mearl Latin PT, DPT Physical Therapist at [facility]

## 2020-06-14 NOTE — Progress Notes (Signed)
*  PRELIMINARY RESULTS* Echocardiogram 2D Echocardiogram has been performed.  [place], [place] 06/14/2020, 12:29 PM

## 2020-06-14 NOTE — Progress Notes (Signed)
Report called to [facility] [place] awaiting for transport via EMS.

## 2020-06-15 LAB — URINE CULTURE

## 2020-06-16 DIAGNOSIS — R262 Difficulty in walking, not elsewhere classified: Secondary | ICD-10-CM | POA: Diagnosis not present

## 2020-06-16 DIAGNOSIS — Z435 Encounter for attention to cystostomy: Secondary | ICD-10-CM | POA: Diagnosis not present

## 2020-06-16 DIAGNOSIS — M6281 Muscle weakness (generalized): Secondary | ICD-10-CM | POA: Diagnosis not present

## 2020-06-16 DIAGNOSIS — M21331 Wrist drop, right wrist: Secondary | ICD-10-CM | POA: Diagnosis not present

## 2020-06-16 DIAGNOSIS — R278 Other lack of coordination: Secondary | ICD-10-CM | POA: Diagnosis not present

## 2020-06-16 DIAGNOSIS — R69 Illness, unspecified: Secondary | ICD-10-CM | POA: Diagnosis not present

## 2020-06-16 DIAGNOSIS — I471 Supraventricular tachycardia: Secondary | ICD-10-CM | POA: Diagnosis not present

## 2020-06-16 DIAGNOSIS — E119 Type 2 diabetes mellitus without complications: Secondary | ICD-10-CM | POA: Diagnosis not present

## 2020-06-16 DIAGNOSIS — I10 Essential (primary) hypertension: Secondary | ICD-10-CM | POA: Diagnosis not present

## 2020-06-17 DIAGNOSIS — R278 Other lack of coordination: Secondary | ICD-10-CM | POA: Diagnosis not present

## 2020-06-17 DIAGNOSIS — I471 Supraventricular tachycardia: Secondary | ICD-10-CM | POA: Diagnosis not present

## 2020-06-17 DIAGNOSIS — M6281 Muscle weakness (generalized): Secondary | ICD-10-CM | POA: Diagnosis not present

## 2020-06-17 DIAGNOSIS — R262 Difficulty in walking, not elsewhere classified: Secondary | ICD-10-CM | POA: Diagnosis not present

## 2020-06-17 DIAGNOSIS — Z435 Encounter for attention to cystostomy: Secondary | ICD-10-CM | POA: Diagnosis not present

## 2020-06-18 DIAGNOSIS — R278 Other lack of coordination: Secondary | ICD-10-CM | POA: Diagnosis not present

## 2020-06-18 DIAGNOSIS — R262 Difficulty in walking, not elsewhere classified: Secondary | ICD-10-CM | POA: Diagnosis not present

## 2020-06-18 DIAGNOSIS — Z435 Encounter for attention to cystostomy: Secondary | ICD-10-CM | POA: Diagnosis not present

## 2020-06-18 DIAGNOSIS — M6281 Muscle weakness (generalized): Secondary | ICD-10-CM | POA: Diagnosis not present

## 2020-06-18 DIAGNOSIS — I471 Supraventricular tachycardia: Secondary | ICD-10-CM | POA: Diagnosis not present

## 2020-06-19 DIAGNOSIS — R278 Other lack of coordination: Secondary | ICD-10-CM | POA: Diagnosis not present

## 2020-06-19 DIAGNOSIS — Z435 Encounter for attention to cystostomy: Secondary | ICD-10-CM | POA: Diagnosis not present

## 2020-06-19 DIAGNOSIS — I471 Supraventricular tachycardia: Secondary | ICD-10-CM | POA: Diagnosis not present

## 2020-06-19 DIAGNOSIS — R262 Difficulty in walking, not elsewhere classified: Secondary | ICD-10-CM | POA: Diagnosis not present

## 2020-06-19 DIAGNOSIS — M6281 Muscle weakness (generalized): Secondary | ICD-10-CM | POA: Diagnosis not present

## 2020-06-20 DIAGNOSIS — R262 Difficulty in walking, not elsewhere classified: Secondary | ICD-10-CM | POA: Diagnosis not present

## 2020-06-20 DIAGNOSIS — R278 Other lack of coordination: Secondary | ICD-10-CM | POA: Diagnosis not present

## 2020-06-20 DIAGNOSIS — I471 Supraventricular tachycardia: Secondary | ICD-10-CM | POA: Diagnosis not present

## 2020-06-20 DIAGNOSIS — Z435 Encounter for attention to cystostomy: Secondary | ICD-10-CM | POA: Diagnosis not present

## 2020-06-20 DIAGNOSIS — M6281 Muscle weakness (generalized): Secondary | ICD-10-CM | POA: Diagnosis not present

## 2020-06-22 DIAGNOSIS — R262 Difficulty in walking, not elsewhere classified: Secondary | ICD-10-CM | POA: Diagnosis not present

## 2020-06-22 DIAGNOSIS — M6281 Muscle weakness (generalized): Secondary | ICD-10-CM | POA: Diagnosis not present

## 2020-06-22 DIAGNOSIS — I471 Supraventricular tachycardia: Secondary | ICD-10-CM | POA: Diagnosis not present

## 2020-06-22 DIAGNOSIS — Z435 Encounter for attention to cystostomy: Secondary | ICD-10-CM | POA: Diagnosis not present

## 2020-06-22 DIAGNOSIS — R278 Other lack of coordination: Secondary | ICD-10-CM | POA: Diagnosis not present

## 2020-06-23 DIAGNOSIS — R262 Difficulty in walking, not elsewhere classified: Secondary | ICD-10-CM | POA: Diagnosis not present

## 2020-06-23 DIAGNOSIS — R278 Other lack of coordination: Secondary | ICD-10-CM | POA: Diagnosis not present

## 2020-06-23 DIAGNOSIS — Z435 Encounter for attention to cystostomy: Secondary | ICD-10-CM | POA: Diagnosis not present

## 2020-06-23 DIAGNOSIS — M6281 Muscle weakness (generalized): Secondary | ICD-10-CM | POA: Diagnosis not present

## 2020-06-23 DIAGNOSIS — I471 Supraventricular tachycardia: Secondary | ICD-10-CM | POA: Diagnosis not present

## 2020-06-24 DIAGNOSIS — M6281 Muscle weakness (generalized): Secondary | ICD-10-CM | POA: Diagnosis not present

## 2020-06-24 DIAGNOSIS — R278 Other lack of coordination: Secondary | ICD-10-CM | POA: Diagnosis not present

## 2020-06-24 DIAGNOSIS — R262 Difficulty in walking, not elsewhere classified: Secondary | ICD-10-CM | POA: Diagnosis not present

## 2020-06-24 DIAGNOSIS — Z435 Encounter for attention to cystostomy: Secondary | ICD-10-CM | POA: Diagnosis not present

## 2020-06-24 DIAGNOSIS — I471 Supraventricular tachycardia: Secondary | ICD-10-CM | POA: Diagnosis not present

## 2020-06-25 ENCOUNTER — Telehealth: Payer: Self-pay

## 2020-06-25 DIAGNOSIS — Z435 Encounter for attention to cystostomy: Secondary | ICD-10-CM | POA: Diagnosis not present

## 2020-06-25 DIAGNOSIS — I471 Supraventricular tachycardia: Secondary | ICD-10-CM | POA: Diagnosis not present

## 2020-06-25 DIAGNOSIS — M6281 Muscle weakness (generalized): Secondary | ICD-10-CM | POA: Diagnosis not present

## 2020-06-25 DIAGNOSIS — R278 Other lack of coordination: Secondary | ICD-10-CM | POA: Diagnosis not present

## 2020-06-25 DIAGNOSIS — R262 Difficulty in walking, not elsewhere classified: Secondary | ICD-10-CM | POA: Diagnosis not present

## 2020-06-25 NOTE — Telephone Encounter (Signed)
Patient's daughter misplaced papers for the instructions, dates and times that had been mailed to her for her mother's upcoming procedures and appointments.  She asked if these papers can be mailed to her again at:  [address] [place], [place] [address]  Thanks, Helene Kelp

## 2020-06-25 NOTE — Telephone Encounter (Signed)
Returned daughters call and will send requested appointment dates and times via mail.

## 2020-06-26 DIAGNOSIS — Z435 Encounter for attention to cystostomy: Secondary | ICD-10-CM | POA: Diagnosis not present

## 2020-06-26 DIAGNOSIS — I471 Supraventricular tachycardia: Secondary | ICD-10-CM | POA: Diagnosis not present

## 2020-06-26 DIAGNOSIS — R262 Difficulty in walking, not elsewhere classified: Secondary | ICD-10-CM | POA: Diagnosis not present

## 2020-06-26 DIAGNOSIS — R278 Other lack of coordination: Secondary | ICD-10-CM | POA: Diagnosis not present

## 2020-06-26 DIAGNOSIS — M6281 Muscle weakness (generalized): Secondary | ICD-10-CM | POA: Diagnosis not present

## 2020-06-27 DIAGNOSIS — Z435 Encounter for attention to cystostomy: Secondary | ICD-10-CM | POA: Diagnosis not present

## 2020-06-27 DIAGNOSIS — R278 Other lack of coordination: Secondary | ICD-10-CM | POA: Diagnosis not present

## 2020-06-27 DIAGNOSIS — R262 Difficulty in walking, not elsewhere classified: Secondary | ICD-10-CM | POA: Diagnosis not present

## 2020-06-27 DIAGNOSIS — M6281 Muscle weakness (generalized): Secondary | ICD-10-CM | POA: Diagnosis not present

## 2020-06-27 DIAGNOSIS — I471 Supraventricular tachycardia: Secondary | ICD-10-CM | POA: Diagnosis not present

## 2020-06-29 DIAGNOSIS — R262 Difficulty in walking, not elsewhere classified: Secondary | ICD-10-CM | POA: Diagnosis not present

## 2020-06-29 DIAGNOSIS — Z435 Encounter for attention to cystostomy: Secondary | ICD-10-CM | POA: Diagnosis not present

## 2020-06-29 DIAGNOSIS — R278 Other lack of coordination: Secondary | ICD-10-CM | POA: Diagnosis not present

## 2020-06-29 DIAGNOSIS — M6281 Muscle weakness (generalized): Secondary | ICD-10-CM | POA: Diagnosis not present

## 2020-06-29 DIAGNOSIS — I471 Supraventricular tachycardia: Secondary | ICD-10-CM | POA: Diagnosis not present

## 2020-06-30 DIAGNOSIS — I471 Supraventricular tachycardia: Secondary | ICD-10-CM | POA: Diagnosis not present

## 2020-06-30 DIAGNOSIS — M6281 Muscle weakness (generalized): Secondary | ICD-10-CM | POA: Diagnosis not present

## 2020-06-30 DIAGNOSIS — R262 Difficulty in walking, not elsewhere classified: Secondary | ICD-10-CM | POA: Diagnosis not present

## 2020-06-30 DIAGNOSIS — R278 Other lack of coordination: Secondary | ICD-10-CM | POA: Diagnosis not present

## 2020-06-30 DIAGNOSIS — Z435 Encounter for attention to cystostomy: Secondary | ICD-10-CM | POA: Diagnosis not present

## 2020-07-01 DIAGNOSIS — I471 Supraventricular tachycardia: Secondary | ICD-10-CM | POA: Diagnosis not present

## 2020-07-01 DIAGNOSIS — R262 Difficulty in walking, not elsewhere classified: Secondary | ICD-10-CM | POA: Diagnosis not present

## 2020-07-01 DIAGNOSIS — R278 Other lack of coordination: Secondary | ICD-10-CM | POA: Diagnosis not present

## 2020-07-01 DIAGNOSIS — Z435 Encounter for attention to cystostomy: Secondary | ICD-10-CM | POA: Diagnosis not present

## 2020-07-01 DIAGNOSIS — M6281 Muscle weakness (generalized): Secondary | ICD-10-CM | POA: Diagnosis not present

## 2020-07-02 DIAGNOSIS — R262 Difficulty in walking, not elsewhere classified: Secondary | ICD-10-CM | POA: Diagnosis not present

## 2020-07-02 DIAGNOSIS — Z435 Encounter for attention to cystostomy: Secondary | ICD-10-CM | POA: Diagnosis not present

## 2020-07-02 DIAGNOSIS — R278 Other lack of coordination: Secondary | ICD-10-CM | POA: Diagnosis not present

## 2020-07-02 DIAGNOSIS — M6281 Muscle weakness (generalized): Secondary | ICD-10-CM | POA: Diagnosis not present

## 2020-07-02 DIAGNOSIS — I471 Supraventricular tachycardia: Secondary | ICD-10-CM | POA: Diagnosis not present

## 2020-07-03 DIAGNOSIS — I471 Supraventricular tachycardia: Secondary | ICD-10-CM | POA: Diagnosis not present

## 2020-07-03 DIAGNOSIS — R278 Other lack of coordination: Secondary | ICD-10-CM | POA: Diagnosis not present

## 2020-07-03 DIAGNOSIS — Z435 Encounter for attention to cystostomy: Secondary | ICD-10-CM | POA: Diagnosis not present

## 2020-07-03 DIAGNOSIS — M6281 Muscle weakness (generalized): Secondary | ICD-10-CM | POA: Diagnosis not present

## 2020-07-03 DIAGNOSIS — R262 Difficulty in walking, not elsewhere classified: Secondary | ICD-10-CM | POA: Diagnosis not present

## 2020-07-04 DIAGNOSIS — I471 Supraventricular tachycardia: Secondary | ICD-10-CM | POA: Diagnosis not present

## 2020-07-04 DIAGNOSIS — R262 Difficulty in walking, not elsewhere classified: Secondary | ICD-10-CM | POA: Diagnosis not present

## 2020-07-04 DIAGNOSIS — R278 Other lack of coordination: Secondary | ICD-10-CM | POA: Diagnosis not present

## 2020-07-04 DIAGNOSIS — M6281 Muscle weakness (generalized): Secondary | ICD-10-CM | POA: Diagnosis not present

## 2020-07-04 DIAGNOSIS — Z435 Encounter for attention to cystostomy: Secondary | ICD-10-CM | POA: Diagnosis not present

## 2020-07-07 DIAGNOSIS — M6281 Muscle weakness (generalized): Secondary | ICD-10-CM | POA: Diagnosis not present

## 2020-07-07 DIAGNOSIS — R262 Difficulty in walking, not elsewhere classified: Secondary | ICD-10-CM | POA: Diagnosis not present

## 2020-07-07 DIAGNOSIS — R278 Other lack of coordination: Secondary | ICD-10-CM | POA: Diagnosis not present

## 2020-07-07 DIAGNOSIS — Z435 Encounter for attention to cystostomy: Secondary | ICD-10-CM | POA: Diagnosis not present

## 2020-07-07 DIAGNOSIS — I471 Supraventricular tachycardia: Secondary | ICD-10-CM | POA: Diagnosis not present

## 2020-07-08 DIAGNOSIS — R278 Other lack of coordination: Secondary | ICD-10-CM | POA: Diagnosis not present

## 2020-07-08 DIAGNOSIS — M6281 Muscle weakness (generalized): Secondary | ICD-10-CM | POA: Diagnosis not present

## 2020-07-08 DIAGNOSIS — E119 Type 2 diabetes mellitus without complications: Secondary | ICD-10-CM | POA: Diagnosis not present

## 2020-07-08 DIAGNOSIS — R262 Difficulty in walking, not elsewhere classified: Secondary | ICD-10-CM | POA: Diagnosis not present

## 2020-07-08 DIAGNOSIS — Z435 Encounter for attention to cystostomy: Secondary | ICD-10-CM | POA: Diagnosis not present

## 2020-07-08 DIAGNOSIS — I471 Supraventricular tachycardia: Secondary | ICD-10-CM | POA: Diagnosis not present

## 2020-07-09 DIAGNOSIS — R278 Other lack of coordination: Secondary | ICD-10-CM | POA: Diagnosis not present

## 2020-07-09 DIAGNOSIS — M6281 Muscle weakness (generalized): Secondary | ICD-10-CM | POA: Diagnosis not present

## 2020-07-09 DIAGNOSIS — I471 Supraventricular tachycardia: Secondary | ICD-10-CM | POA: Diagnosis not present

## 2020-07-09 DIAGNOSIS — R262 Difficulty in walking, not elsewhere classified: Secondary | ICD-10-CM | POA: Diagnosis not present

## 2020-07-09 DIAGNOSIS — Z435 Encounter for attention to cystostomy: Secondary | ICD-10-CM | POA: Diagnosis not present

## 2020-07-10 DIAGNOSIS — I471 Supraventricular tachycardia: Secondary | ICD-10-CM | POA: Diagnosis not present

## 2020-07-10 DIAGNOSIS — M6281 Muscle weakness (generalized): Secondary | ICD-10-CM | POA: Diagnosis not present

## 2020-07-10 DIAGNOSIS — Z435 Encounter for attention to cystostomy: Secondary | ICD-10-CM | POA: Diagnosis not present

## 2020-07-10 DIAGNOSIS — R262 Difficulty in walking, not elsewhere classified: Secondary | ICD-10-CM | POA: Diagnosis not present

## 2020-07-10 DIAGNOSIS — R278 Other lack of coordination: Secondary | ICD-10-CM | POA: Diagnosis not present

## 2020-07-11 DIAGNOSIS — Z435 Encounter for attention to cystostomy: Secondary | ICD-10-CM | POA: Diagnosis not present

## 2020-07-11 DIAGNOSIS — M6281 Muscle weakness (generalized): Secondary | ICD-10-CM | POA: Diagnosis not present

## 2020-07-11 DIAGNOSIS — R278 Other lack of coordination: Secondary | ICD-10-CM | POA: Diagnosis not present

## 2020-07-11 DIAGNOSIS — I471 Supraventricular tachycardia: Secondary | ICD-10-CM | POA: Diagnosis not present

## 2020-07-11 DIAGNOSIS — R262 Difficulty in walking, not elsewhere classified: Secondary | ICD-10-CM | POA: Diagnosis not present

## 2020-07-14 DIAGNOSIS — H90A22 Sensorineural hearing loss, unilateral, left ear, with restricted hearing on the contralateral side: Secondary | ICD-10-CM | POA: Diagnosis not present

## 2020-07-14 DIAGNOSIS — R278 Other lack of coordination: Secondary | ICD-10-CM | POA: Diagnosis not present

## 2020-07-14 DIAGNOSIS — M6281 Muscle weakness (generalized): Secondary | ICD-10-CM | POA: Diagnosis not present

## 2020-07-14 DIAGNOSIS — R414 Neurologic neglect syndrome: Secondary | ICD-10-CM | POA: Diagnosis not present

## 2020-07-14 DIAGNOSIS — R262 Difficulty in walking, not elsewhere classified: Secondary | ICD-10-CM | POA: Diagnosis not present

## 2020-07-14 DIAGNOSIS — Z435 Encounter for attention to cystostomy: Secondary | ICD-10-CM | POA: Diagnosis not present

## 2020-07-15 DIAGNOSIS — R278 Other lack of coordination: Secondary | ICD-10-CM | POA: Diagnosis not present

## 2020-07-15 DIAGNOSIS — M6281 Muscle weakness (generalized): Secondary | ICD-10-CM | POA: Diagnosis not present

## 2020-07-15 DIAGNOSIS — Z435 Encounter for attention to cystostomy: Secondary | ICD-10-CM | POA: Diagnosis not present

## 2020-07-15 DIAGNOSIS — R414 Neurologic neglect syndrome: Secondary | ICD-10-CM | POA: Diagnosis not present

## 2020-07-15 DIAGNOSIS — R262 Difficulty in walking, not elsewhere classified: Secondary | ICD-10-CM | POA: Diagnosis not present

## 2020-07-16 DIAGNOSIS — R262 Difficulty in walking, not elsewhere classified: Secondary | ICD-10-CM | POA: Diagnosis not present

## 2020-07-16 DIAGNOSIS — R278 Other lack of coordination: Secondary | ICD-10-CM | POA: Diagnosis not present

## 2020-07-16 DIAGNOSIS — M6281 Muscle weakness (generalized): Secondary | ICD-10-CM | POA: Diagnosis not present

## 2020-07-16 DIAGNOSIS — Z435 Encounter for attention to cystostomy: Secondary | ICD-10-CM | POA: Diagnosis not present

## 2020-07-16 DIAGNOSIS — R414 Neurologic neglect syndrome: Secondary | ICD-10-CM | POA: Diagnosis not present

## 2020-07-17 DIAGNOSIS — M6281 Muscle weakness (generalized): Secondary | ICD-10-CM | POA: Diagnosis not present

## 2020-07-17 DIAGNOSIS — R278 Other lack of coordination: Secondary | ICD-10-CM | POA: Diagnosis not present

## 2020-07-17 DIAGNOSIS — Z435 Encounter for attention to cystostomy: Secondary | ICD-10-CM | POA: Diagnosis not present

## 2020-07-17 DIAGNOSIS — R262 Difficulty in walking, not elsewhere classified: Secondary | ICD-10-CM | POA: Diagnosis not present

## 2020-07-17 DIAGNOSIS — R414 Neurologic neglect syndrome: Secondary | ICD-10-CM | POA: Diagnosis not present

## 2020-07-18 DIAGNOSIS — M6281 Muscle weakness (generalized): Secondary | ICD-10-CM | POA: Diagnosis not present

## 2020-07-18 DIAGNOSIS — R278 Other lack of coordination: Secondary | ICD-10-CM | POA: Diagnosis not present

## 2020-07-18 DIAGNOSIS — R262 Difficulty in walking, not elsewhere classified: Secondary | ICD-10-CM | POA: Diagnosis not present

## 2020-07-18 DIAGNOSIS — Z435 Encounter for attention to cystostomy: Secondary | ICD-10-CM | POA: Diagnosis not present

## 2020-07-18 DIAGNOSIS — R414 Neurologic neglect syndrome: Secondary | ICD-10-CM | POA: Diagnosis not present

## 2020-07-21 DIAGNOSIS — Z435 Encounter for attention to cystostomy: Secondary | ICD-10-CM | POA: Diagnosis not present

## 2020-07-21 DIAGNOSIS — R262 Difficulty in walking, not elsewhere classified: Secondary | ICD-10-CM | POA: Diagnosis not present

## 2020-07-21 DIAGNOSIS — R414 Neurologic neglect syndrome: Secondary | ICD-10-CM | POA: Diagnosis not present

## 2020-07-21 DIAGNOSIS — R278 Other lack of coordination: Secondary | ICD-10-CM | POA: Diagnosis not present

## 2020-07-21 DIAGNOSIS — M6281 Muscle weakness (generalized): Secondary | ICD-10-CM | POA: Diagnosis not present

## 2020-07-22 DIAGNOSIS — R262 Difficulty in walking, not elsewhere classified: Secondary | ICD-10-CM | POA: Diagnosis not present

## 2020-07-22 DIAGNOSIS — M6281 Muscle weakness (generalized): Secondary | ICD-10-CM | POA: Diagnosis not present

## 2020-07-22 DIAGNOSIS — Z435 Encounter for attention to cystostomy: Secondary | ICD-10-CM | POA: Diagnosis not present

## 2020-07-22 DIAGNOSIS — R278 Other lack of coordination: Secondary | ICD-10-CM | POA: Diagnosis not present

## 2020-07-22 DIAGNOSIS — R414 Neurologic neglect syndrome: Secondary | ICD-10-CM | POA: Diagnosis not present

## 2020-07-23 DIAGNOSIS — R278 Other lack of coordination: Secondary | ICD-10-CM | POA: Diagnosis not present

## 2020-07-23 DIAGNOSIS — M6281 Muscle weakness (generalized): Secondary | ICD-10-CM | POA: Diagnosis not present

## 2020-07-23 DIAGNOSIS — Z435 Encounter for attention to cystostomy: Secondary | ICD-10-CM | POA: Diagnosis not present

## 2020-07-23 DIAGNOSIS — R262 Difficulty in walking, not elsewhere classified: Secondary | ICD-10-CM | POA: Diagnosis not present

## 2020-07-23 DIAGNOSIS — R414 Neurologic neglect syndrome: Secondary | ICD-10-CM | POA: Diagnosis not present

## 2020-07-24 DIAGNOSIS — R278 Other lack of coordination: Secondary | ICD-10-CM | POA: Diagnosis not present

## 2020-07-24 DIAGNOSIS — M6281 Muscle weakness (generalized): Secondary | ICD-10-CM | POA: Diagnosis not present

## 2020-07-24 DIAGNOSIS — R414 Neurologic neglect syndrome: Secondary | ICD-10-CM | POA: Diagnosis not present

## 2020-07-24 DIAGNOSIS — Z435 Encounter for attention to cystostomy: Secondary | ICD-10-CM | POA: Diagnosis not present

## 2020-07-24 DIAGNOSIS — R262 Difficulty in walking, not elsewhere classified: Secondary | ICD-10-CM | POA: Diagnosis not present

## 2020-07-25 DIAGNOSIS — R278 Other lack of coordination: Secondary | ICD-10-CM | POA: Diagnosis not present

## 2020-07-25 DIAGNOSIS — Z435 Encounter for attention to cystostomy: Secondary | ICD-10-CM | POA: Diagnosis not present

## 2020-07-25 DIAGNOSIS — M6281 Muscle weakness (generalized): Secondary | ICD-10-CM | POA: Diagnosis not present

## 2020-07-25 DIAGNOSIS — R414 Neurologic neglect syndrome: Secondary | ICD-10-CM | POA: Diagnosis not present

## 2020-07-25 DIAGNOSIS — R262 Difficulty in walking, not elsewhere classified: Secondary | ICD-10-CM | POA: Diagnosis not present

## 2020-07-28 DIAGNOSIS — M6281 Muscle weakness (generalized): Secondary | ICD-10-CM | POA: Diagnosis not present

## 2020-07-28 DIAGNOSIS — R278 Other lack of coordination: Secondary | ICD-10-CM | POA: Diagnosis not present

## 2020-07-28 DIAGNOSIS — Z435 Encounter for attention to cystostomy: Secondary | ICD-10-CM | POA: Diagnosis not present

## 2020-07-28 DIAGNOSIS — R414 Neurologic neglect syndrome: Secondary | ICD-10-CM | POA: Diagnosis not present

## 2020-07-28 DIAGNOSIS — R262 Difficulty in walking, not elsewhere classified: Secondary | ICD-10-CM | POA: Diagnosis not present

## 2020-07-29 DIAGNOSIS — R69 Illness, unspecified: Secondary | ICD-10-CM | POA: Diagnosis not present

## 2020-07-29 DIAGNOSIS — M6281 Muscle weakness (generalized): Secondary | ICD-10-CM | POA: Diagnosis not present

## 2020-07-29 DIAGNOSIS — R278 Other lack of coordination: Secondary | ICD-10-CM | POA: Diagnosis not present

## 2020-07-29 DIAGNOSIS — R251 Tremor, unspecified: Secondary | ICD-10-CM | POA: Diagnosis not present

## 2020-07-29 DIAGNOSIS — G5631 Lesion of radial nerve, right upper limb: Secondary | ICD-10-CM | POA: Diagnosis not present

## 2020-07-29 DIAGNOSIS — R414 Neurologic neglect syndrome: Secondary | ICD-10-CM | POA: Diagnosis not present

## 2020-07-29 DIAGNOSIS — R262 Difficulty in walking, not elsewhere classified: Secondary | ICD-10-CM | POA: Diagnosis not present

## 2020-07-29 DIAGNOSIS — Z435 Encounter for attention to cystostomy: Secondary | ICD-10-CM | POA: Diagnosis not present

## 2020-07-30 DIAGNOSIS — R278 Other lack of coordination: Secondary | ICD-10-CM | POA: Diagnosis not present

## 2020-07-30 DIAGNOSIS — M6281 Muscle weakness (generalized): Secondary | ICD-10-CM | POA: Diagnosis not present

## 2020-07-30 DIAGNOSIS — R414 Neurologic neglect syndrome: Secondary | ICD-10-CM | POA: Diagnosis not present

## 2020-07-30 DIAGNOSIS — R262 Difficulty in walking, not elsewhere classified: Secondary | ICD-10-CM | POA: Diagnosis not present

## 2020-07-30 DIAGNOSIS — Z435 Encounter for attention to cystostomy: Secondary | ICD-10-CM | POA: Diagnosis not present

## 2020-07-31 DIAGNOSIS — E119 Type 2 diabetes mellitus without complications: Secondary | ICD-10-CM | POA: Diagnosis not present

## 2020-07-31 DIAGNOSIS — M6281 Muscle weakness (generalized): Secondary | ICD-10-CM | POA: Diagnosis not present

## 2020-07-31 DIAGNOSIS — E785 Hyperlipidemia, unspecified: Secondary | ICD-10-CM | POA: Diagnosis not present

## 2020-07-31 DIAGNOSIS — I471 Supraventricular tachycardia: Secondary | ICD-10-CM | POA: Diagnosis not present

## 2020-07-31 DIAGNOSIS — I10 Essential (primary) hypertension: Secondary | ICD-10-CM | POA: Diagnosis not present

## 2020-07-31 DIAGNOSIS — R69 Illness, unspecified: Secondary | ICD-10-CM | POA: Diagnosis not present

## 2020-07-31 DIAGNOSIS — Z435 Encounter for attention to cystostomy: Secondary | ICD-10-CM | POA: Diagnosis not present

## 2020-07-31 DIAGNOSIS — M21331 Wrist drop, right wrist: Secondary | ICD-10-CM | POA: Diagnosis not present

## 2020-07-31 DIAGNOSIS — R414 Neurologic neglect syndrome: Secondary | ICD-10-CM | POA: Diagnosis not present

## 2020-07-31 DIAGNOSIS — R262 Difficulty in walking, not elsewhere classified: Secondary | ICD-10-CM | POA: Diagnosis not present

## 2020-07-31 DIAGNOSIS — R278 Other lack of coordination: Secondary | ICD-10-CM | POA: Diagnosis not present

## 2020-08-02 DIAGNOSIS — R278 Other lack of coordination: Secondary | ICD-10-CM | POA: Diagnosis not present

## 2020-08-02 DIAGNOSIS — R262 Difficulty in walking, not elsewhere classified: Secondary | ICD-10-CM | POA: Diagnosis not present

## 2020-08-02 DIAGNOSIS — R414 Neurologic neglect syndrome: Secondary | ICD-10-CM | POA: Diagnosis not present

## 2020-08-02 DIAGNOSIS — Z435 Encounter for attention to cystostomy: Secondary | ICD-10-CM | POA: Diagnosis not present

## 2020-08-02 DIAGNOSIS — M6281 Muscle weakness (generalized): Secondary | ICD-10-CM | POA: Diagnosis not present

## 2020-08-04 DIAGNOSIS — Z435 Encounter for attention to cystostomy: Secondary | ICD-10-CM | POA: Diagnosis not present

## 2020-08-04 DIAGNOSIS — R262 Difficulty in walking, not elsewhere classified: Secondary | ICD-10-CM | POA: Diagnosis not present

## 2020-08-04 DIAGNOSIS — R414 Neurologic neglect syndrome: Secondary | ICD-10-CM | POA: Diagnosis not present

## 2020-08-04 DIAGNOSIS — R278 Other lack of coordination: Secondary | ICD-10-CM | POA: Diagnosis not present

## 2020-08-04 DIAGNOSIS — M6281 Muscle weakness (generalized): Secondary | ICD-10-CM | POA: Diagnosis not present

## 2020-08-13 DIAGNOSIS — E1159 Type 2 diabetes mellitus with other circulatory complications: Secondary | ICD-10-CM | POA: Diagnosis not present

## 2020-08-13 DIAGNOSIS — L603 Nail dystrophy: Secondary | ICD-10-CM | POA: Diagnosis not present

## 2020-08-21 DIAGNOSIS — R42 Dizziness and giddiness: Secondary | ICD-10-CM | POA: Insufficient documentation

## 2020-08-21 DIAGNOSIS — G5631 Lesion of radial nerve, right upper limb: Secondary | ICD-10-CM | POA: Diagnosis not present

## 2020-08-21 DIAGNOSIS — G5612 Other lesions of median nerve, left upper limb: Secondary | ICD-10-CM | POA: Diagnosis not present

## 2020-08-21 DIAGNOSIS — G5611 Other lesions of median nerve, right upper limb: Secondary | ICD-10-CM | POA: Diagnosis not present

## 2020-08-21 NOTE — Progress Notes (Signed)
Cardiology Office Note   Date:  08/22/2020   ID:  Theresa Hunt, DOB November 25, 1929, MRN [id number]  PCP:  [name], MD  Cardiologist:   Minus Breeding, MD   Chief Complaint  Patient presents with   Palpitations       History of Present Illness: Theresa Hunt is a 87 y.o. female who presents for evaluation of SVT.  I have not seen her since 2017.  She was in the hospital earlier this year for TIA versus CVA.  I reviewed these records for this appointment.  Head CT and MRI demonstrated no acute findings.  Echocardiogram demonstrated well-preserved ejection fraction.  There is mild left ventricular hypertrophy.  There was moderate thickening of the aortic valve.  She lives at the [facility].  She is almost full assist and has to be helped up to the wheelchair.  She is a fall risk there.  She has dementia and really cannot answer most of the questions.  The caregiver with her said she has significant depression.  However, they did not witness her clutching her chest or having any episodes of shortness of breath.  There have been no reported arrhythmias or abnormal vital signs.  She sleeps in a hospital bed with her head mildly elevated.  She does not eat very much.  Today she is denying any chest pressure, neck or arm discomfort.  She is not reporting any shortness of breath.  She is seeing a neurologist because she has been kind of leaning to one side and has some problems with right hand weakness.  This is braced.  Past Medical History:  Diagnosis Date   Dementia ([place])    Hyperlipidemia    Hypertension    Interstitial cystitis    SVT (supraventricular tachycardia) ([facility])     Past Surgical History:  Procedure Laterality Date   ABDOMINAL HYSTERECTOMY     CHOLECYSTECTOMY     CYSTECTOMY W/ URETEROILEAL CONDUIT     REVISION UROSTOMY CUTANEOUS       Current Outpatient Medications  Medication Sig Dispense Refill   ascorbic acid (VITAMIN C) 500 MG tablet Take 500 mg by mouth  daily.     aspirin 81 MG chewable tablet Chew 4 tablets (324 mg total) by mouth daily. 30 tablet 3   Cholecalciferol (VITAMIN D3) 50 MCG (2000 UT) capsule Take 2,000 Units by mouth daily.      Control Gel Formula Dressing (DUODERM CGF DRESSING) MISC Apply 1 each topically every 3 (three) days. Size 2x2 will work better for area of skin 5 each 3   loratadine (CLARITIN) 10 MG tablet Take 1 tablet by mouth daily.     memantine (NAMENDA) 10 MG tablet Take 10 mg by mouth daily. Take 1/2 tablet by mouth daily     metFORMIN (GLUCOPHAGE) 500 MG tablet TAKE ONE (1) TABLET EACH DAY (Patient taking differently: Take 500 mg by mouth daily with breakfast.) 30 tablet 0   metoprolol succinate (TOPROL-XL) 25 MG 24 hr tablet Take 3 tablets (75 mg total) by mouth in the morning, at noon, and at bedtime. Take with or immediately following a meal. (Patient taking differently: Take 75 mg by mouth daily. Take with or immediately following a meal.)     nitrofurantoin, macrocrystal-monohydrate, (MACROBID) 100 MG capsule Take 100 mg by mouth 2 (two) times daily.     sertraline (ZOLOFT) 25 MG tablet Take 25 mg by mouth daily.     zinc gluconate 50 MG tablet Take 50  mg by mouth daily.     No current facility-administered medications for this visit.    Allergies:   Codeine and Donepezil    Social History:  The patient  reports that she has never smoked. She has never used smokeless tobacco. She reports that she does not drink alcohol and does not use drugs.   Family History:  The patient's family history includes CAD (age of onset: 34) in her mother; Cancer in her daughter; Emphysema in her father.   Unable to obtain this from the patient.   ROS:  Please see the history of present illness.   Otherwise, review of systems are positive for none.  Unable to obtain this from the patient all other systems are reviewed and negative.    PHYSICAL EXAM: VS:  BP 119/78   Pulse 100   Ht 5\' 2"  (1.575 m)   Wt 118 lb (53.5 kg)    BMI 21.58 kg/m  , BMI Body mass index is 21.58 kg/m. GEN:  No distress, very frail appearing NECK:  No jugular venous distention at 90 degrees, waveform within normal limits, carotid upstroke brisk and symmetric, no bruits, no thyromegaly LYMPHATICS:  No cervical adenopathy LUNGS:  Clear to auscultation bilaterally BACK:  No CVA tenderness CHEST:  Unremarkable HEART:  S1 and S2 within normal limits, no S3, no S4, no clicks, no rubs, soft systolic murmur murmurs ABD:  Positive bowel sounds normal in frequency in pitch, no bruits, no rebound, no guarding, unable to assess midline mass or bruit with the patient seated. EXT:  2 plus pulses throughout, no edema, no cyanosis no clubbing SKIN:  No rashes no nodules NEURO:  Cranial nerves II through XII grossly intact, motor grossly intact throughout PSYCH:  Cognitively intact, oriented to person place and time     EKG:  EKG is ordered today. The ekg ordered today demonstrates sinus tachycardia, rate 114, short PR interval, no acute ST-T wave changes.   Recent Labs: 02/13/2020: Magnesium 1.9 06/14/2020: ALT 13; BUN 24; Creatinine, Ser 0.54; Hemoglobin 12.4; Platelets 162; Potassium 4.0; Sodium 140    Lipid Panel    Component Value Date/Time   CHOL 231 (H) 06/14/2020 0602   TRIG 126 06/14/2020 0602   HDL 40 (L) 06/14/2020 0602   CHOLHDL 5.8 06/14/2020 0602   VLDL 25 06/14/2020 0602   LDLCALC 166 (H) 06/14/2020 0602      Wt Readings from Last 3 Encounters:  08/22/20 118 lb (53.5 kg)  06/13/20 123 lb 7.3 oz (56 kg)  02/15/20 141 lb 12.1 oz (64.3 kg)      Other studies Reviewed: Additional studies/ records that were reviewed today include: Hospital records from her admission for TIA/strok. Review of the above records demonstrates:  Please see elsewhere in the note.    ASSESSMENT AND PLAN:  SVT: There has been no recurrent SVT noted.  She is not describing any palpitations.  No change in therapy.  TIA: There was a  questionable TIA but no obvious findings on CT MRI in the hospital.  I looked through these notes and could not find any suggestion that there could have been a dysrhythmia.  I am not going to screen her for atrial fibrillation as she would be a very poor candidate for anticoagulation.  Tachycardia: Her EKG showed sinus tachycardia.  I will give instructions to this note to the nursing home to record her heart rate particularly when she is at rest and at night.  If it does not come  down from this level she probably would need to monitor although I suspect that this is sinus tachycardia rather than some other mechanism.   Current medicines are reviewed at length with the patient today.  The patient does not have concerns regarding medicines.  The following changes have been made:  no change  Labs/ tests ordered today include: None No orders of the defined types were placed in this encounter.    Disposition:   FU with me as needed.       Signed, Minus Breeding, MD  08/22/2020 10:57 AM    [facility]

## 2020-08-22 ENCOUNTER — Other Ambulatory Visit: Payer: Self-pay

## 2020-08-22 ENCOUNTER — Encounter: Payer: Self-pay | Admitting: Cardiology

## 2020-08-22 ENCOUNTER — Ambulatory Visit (INDEPENDENT_AMBULATORY_CARE_PROVIDER_SITE_OTHER): Payer: Medicare HMO | Admitting: Cardiology

## 2020-08-22 VITALS — BP 119/78 | HR 100 | Ht 62.0 in | Wt 118.0 lb

## 2020-08-22 DIAGNOSIS — R42 Dizziness and giddiness: Secondary | ICD-10-CM | POA: Diagnosis not present

## 2020-08-22 DIAGNOSIS — I471 Supraventricular tachycardia, unspecified: Secondary | ICD-10-CM

## 2020-08-22 NOTE — Patient Instructions (Signed)
Medication Instructions:  Continue current medications  *If you need a refill on your cardiac medications before your next appointment, please call your pharmacy*   Lab Work: None Ordered   Testing/Procedures: None Ordered   Follow-Up: At [facility], you and your health needs are our priority.  As part of our continuing mission to provide you with exceptional heart care, we have created designated Provider Care Teams.  These Care Teams include your primary Cardiologist (physician) and Advanced Practice Providers (APPs -  Physician Assistants and Nurse Practitioners) who all work together to provide you with the care you need, when you need it.  We recommend signing up for the patient portal called "MyChart".  Sign up information is provided on this After Visit Summary.  MyChart is used to connect with patients for Virtual Visits (Telemedicine).  Patients are able to view lab/test results, encounter notes, upcoming appointments, etc.  Non-urgent messages can be sent to your provider as well.   To learn more about what you can do with MyChart, go to [email or web address].    Your next appointment:   As Needed

## 2020-08-22 NOTE — Addendum Note (Signed)
Addended by: Claude Manges on: 08/22/2020 11:20 AM   Modules accepted: Orders

## 2020-08-25 DIAGNOSIS — Z435 Encounter for attention to cystostomy: Secondary | ICD-10-CM | POA: Diagnosis not present

## 2020-08-25 DIAGNOSIS — R278 Other lack of coordination: Secondary | ICD-10-CM | POA: Diagnosis not present

## 2020-08-25 DIAGNOSIS — R414 Neurologic neglect syndrome: Secondary | ICD-10-CM | POA: Diagnosis not present

## 2020-08-25 DIAGNOSIS — R1312 Dysphagia, oropharyngeal phase: Secondary | ICD-10-CM | POA: Diagnosis not present

## 2020-08-25 DIAGNOSIS — M6281 Muscle weakness (generalized): Secondary | ICD-10-CM | POA: Diagnosis not present

## 2020-08-25 DIAGNOSIS — R262 Difficulty in walking, not elsewhere classified: Secondary | ICD-10-CM | POA: Diagnosis not present

## 2020-08-25 DIAGNOSIS — R41841 Cognitive communication deficit: Secondary | ICD-10-CM | POA: Diagnosis not present

## 2020-08-26 DIAGNOSIS — M6281 Muscle weakness (generalized): Secondary | ICD-10-CM | POA: Diagnosis not present

## 2020-08-26 DIAGNOSIS — R414 Neurologic neglect syndrome: Secondary | ICD-10-CM | POA: Diagnosis not present

## 2020-08-26 DIAGNOSIS — R41841 Cognitive communication deficit: Secondary | ICD-10-CM | POA: Diagnosis not present

## 2020-08-26 DIAGNOSIS — E119 Type 2 diabetes mellitus without complications: Secondary | ICD-10-CM | POA: Diagnosis not present

## 2020-08-26 DIAGNOSIS — R1312 Dysphagia, oropharyngeal phase: Secondary | ICD-10-CM | POA: Diagnosis not present

## 2020-08-26 DIAGNOSIS — I10 Essential (primary) hypertension: Secondary | ICD-10-CM | POA: Diagnosis not present

## 2020-08-26 DIAGNOSIS — E559 Vitamin D deficiency, unspecified: Secondary | ICD-10-CM | POA: Diagnosis not present

## 2020-08-26 DIAGNOSIS — R278 Other lack of coordination: Secondary | ICD-10-CM | POA: Diagnosis not present

## 2020-08-26 DIAGNOSIS — Z435 Encounter for attention to cystostomy: Secondary | ICD-10-CM | POA: Diagnosis not present

## 2020-08-26 DIAGNOSIS — R262 Difficulty in walking, not elsewhere classified: Secondary | ICD-10-CM | POA: Diagnosis not present

## 2020-08-27 DIAGNOSIS — R262 Difficulty in walking, not elsewhere classified: Secondary | ICD-10-CM | POA: Diagnosis not present

## 2020-08-27 DIAGNOSIS — R1312 Dysphagia, oropharyngeal phase: Secondary | ICD-10-CM | POA: Diagnosis not present

## 2020-08-27 DIAGNOSIS — R414 Neurologic neglect syndrome: Secondary | ICD-10-CM | POA: Diagnosis not present

## 2020-08-27 DIAGNOSIS — R41841 Cognitive communication deficit: Secondary | ICD-10-CM | POA: Diagnosis not present

## 2020-08-27 DIAGNOSIS — R278 Other lack of coordination: Secondary | ICD-10-CM | POA: Diagnosis not present

## 2020-08-27 DIAGNOSIS — M6281 Muscle weakness (generalized): Secondary | ICD-10-CM | POA: Diagnosis not present

## 2020-08-27 DIAGNOSIS — Z435 Encounter for attention to cystostomy: Secondary | ICD-10-CM | POA: Diagnosis not present

## 2020-08-29 DIAGNOSIS — R41841 Cognitive communication deficit: Secondary | ICD-10-CM | POA: Diagnosis not present

## 2020-08-29 DIAGNOSIS — M6281 Muscle weakness (generalized): Secondary | ICD-10-CM | POA: Diagnosis not present

## 2020-08-29 DIAGNOSIS — Z435 Encounter for attention to cystostomy: Secondary | ICD-10-CM | POA: Diagnosis not present

## 2020-08-29 DIAGNOSIS — R262 Difficulty in walking, not elsewhere classified: Secondary | ICD-10-CM | POA: Diagnosis not present

## 2020-08-29 DIAGNOSIS — R278 Other lack of coordination: Secondary | ICD-10-CM | POA: Diagnosis not present

## 2020-08-29 DIAGNOSIS — R1312 Dysphagia, oropharyngeal phase: Secondary | ICD-10-CM | POA: Diagnosis not present

## 2020-08-29 DIAGNOSIS — R414 Neurologic neglect syndrome: Secondary | ICD-10-CM | POA: Diagnosis not present

## 2020-08-30 DIAGNOSIS — M6281 Muscle weakness (generalized): Secondary | ICD-10-CM | POA: Diagnosis not present

## 2020-08-30 DIAGNOSIS — Z435 Encounter for attention to cystostomy: Secondary | ICD-10-CM | POA: Diagnosis not present

## 2020-08-30 DIAGNOSIS — R278 Other lack of coordination: Secondary | ICD-10-CM | POA: Diagnosis not present

## 2020-08-30 DIAGNOSIS — R262 Difficulty in walking, not elsewhere classified: Secondary | ICD-10-CM | POA: Diagnosis not present

## 2020-08-30 DIAGNOSIS — R41841 Cognitive communication deficit: Secondary | ICD-10-CM | POA: Diagnosis not present

## 2020-08-30 DIAGNOSIS — R414 Neurologic neglect syndrome: Secondary | ICD-10-CM | POA: Diagnosis not present

## 2020-08-30 DIAGNOSIS — R1312 Dysphagia, oropharyngeal phase: Secondary | ICD-10-CM | POA: Diagnosis not present

## 2020-09-01 DIAGNOSIS — R1312 Dysphagia, oropharyngeal phase: Secondary | ICD-10-CM | POA: Diagnosis not present

## 2020-09-01 DIAGNOSIS — R414 Neurologic neglect syndrome: Secondary | ICD-10-CM | POA: Diagnosis not present

## 2020-09-01 DIAGNOSIS — R262 Difficulty in walking, not elsewhere classified: Secondary | ICD-10-CM | POA: Diagnosis not present

## 2020-09-01 DIAGNOSIS — Z435 Encounter for attention to cystostomy: Secondary | ICD-10-CM | POA: Diagnosis not present

## 2020-09-01 DIAGNOSIS — M6281 Muscle weakness (generalized): Secondary | ICD-10-CM | POA: Diagnosis not present

## 2020-09-01 DIAGNOSIS — R41841 Cognitive communication deficit: Secondary | ICD-10-CM | POA: Diagnosis not present

## 2020-09-01 DIAGNOSIS — R278 Other lack of coordination: Secondary | ICD-10-CM | POA: Diagnosis not present

## 2020-09-02 DIAGNOSIS — R414 Neurologic neglect syndrome: Secondary | ICD-10-CM | POA: Diagnosis not present

## 2020-09-02 DIAGNOSIS — R262 Difficulty in walking, not elsewhere classified: Secondary | ICD-10-CM | POA: Diagnosis not present

## 2020-09-02 DIAGNOSIS — M6281 Muscle weakness (generalized): Secondary | ICD-10-CM | POA: Diagnosis not present

## 2020-09-02 DIAGNOSIS — R41841 Cognitive communication deficit: Secondary | ICD-10-CM | POA: Diagnosis not present

## 2020-09-02 DIAGNOSIS — R1312 Dysphagia, oropharyngeal phase: Secondary | ICD-10-CM | POA: Diagnosis not present

## 2020-09-02 DIAGNOSIS — Z435 Encounter for attention to cystostomy: Secondary | ICD-10-CM | POA: Diagnosis not present

## 2020-09-02 DIAGNOSIS — R278 Other lack of coordination: Secondary | ICD-10-CM | POA: Diagnosis not present

## 2020-09-03 DIAGNOSIS — R1312 Dysphagia, oropharyngeal phase: Secondary | ICD-10-CM | POA: Diagnosis not present

## 2020-09-03 DIAGNOSIS — R278 Other lack of coordination: Secondary | ICD-10-CM | POA: Diagnosis not present

## 2020-09-03 DIAGNOSIS — M6281 Muscle weakness (generalized): Secondary | ICD-10-CM | POA: Diagnosis not present

## 2020-09-03 DIAGNOSIS — Z435 Encounter for attention to cystostomy: Secondary | ICD-10-CM | POA: Diagnosis not present

## 2020-09-03 DIAGNOSIS — R41841 Cognitive communication deficit: Secondary | ICD-10-CM | POA: Diagnosis not present

## 2020-09-03 DIAGNOSIS — R262 Difficulty in walking, not elsewhere classified: Secondary | ICD-10-CM | POA: Diagnosis not present

## 2020-09-03 DIAGNOSIS — R414 Neurologic neglect syndrome: Secondary | ICD-10-CM | POA: Diagnosis not present

## 2020-09-04 DIAGNOSIS — Z435 Encounter for attention to cystostomy: Secondary | ICD-10-CM | POA: Diagnosis not present

## 2020-09-04 DIAGNOSIS — R278 Other lack of coordination: Secondary | ICD-10-CM | POA: Diagnosis not present

## 2020-09-04 DIAGNOSIS — M6281 Muscle weakness (generalized): Secondary | ICD-10-CM | POA: Diagnosis not present

## 2020-09-04 DIAGNOSIS — R262 Difficulty in walking, not elsewhere classified: Secondary | ICD-10-CM | POA: Diagnosis not present

## 2020-09-04 DIAGNOSIS — R41841 Cognitive communication deficit: Secondary | ICD-10-CM | POA: Diagnosis not present

## 2020-09-04 DIAGNOSIS — R1312 Dysphagia, oropharyngeal phase: Secondary | ICD-10-CM | POA: Diagnosis not present

## 2020-09-04 DIAGNOSIS — R414 Neurologic neglect syndrome: Secondary | ICD-10-CM | POA: Diagnosis not present

## 2020-09-05 DIAGNOSIS — R262 Difficulty in walking, not elsewhere classified: Secondary | ICD-10-CM | POA: Diagnosis not present

## 2020-09-05 DIAGNOSIS — R278 Other lack of coordination: Secondary | ICD-10-CM | POA: Diagnosis not present

## 2020-09-05 DIAGNOSIS — M6281 Muscle weakness (generalized): Secondary | ICD-10-CM | POA: Diagnosis not present

## 2020-09-05 DIAGNOSIS — Z435 Encounter for attention to cystostomy: Secondary | ICD-10-CM | POA: Diagnosis not present

## 2020-09-05 DIAGNOSIS — R414 Neurologic neglect syndrome: Secondary | ICD-10-CM | POA: Diagnosis not present

## 2020-09-05 DIAGNOSIS — R1312 Dysphagia, oropharyngeal phase: Secondary | ICD-10-CM | POA: Diagnosis not present

## 2020-09-05 DIAGNOSIS — R41841 Cognitive communication deficit: Secondary | ICD-10-CM | POA: Diagnosis not present

## 2020-09-06 DIAGNOSIS — R1312 Dysphagia, oropharyngeal phase: Secondary | ICD-10-CM | POA: Diagnosis not present

## 2020-09-06 DIAGNOSIS — M6281 Muscle weakness (generalized): Secondary | ICD-10-CM | POA: Diagnosis not present

## 2020-09-06 DIAGNOSIS — Z435 Encounter for attention to cystostomy: Secondary | ICD-10-CM | POA: Diagnosis not present

## 2020-09-06 DIAGNOSIS — R41841 Cognitive communication deficit: Secondary | ICD-10-CM | POA: Diagnosis not present

## 2020-09-06 DIAGNOSIS — R278 Other lack of coordination: Secondary | ICD-10-CM | POA: Diagnosis not present

## 2020-09-06 DIAGNOSIS — R414 Neurologic neglect syndrome: Secondary | ICD-10-CM | POA: Diagnosis not present

## 2020-09-06 DIAGNOSIS — R262 Difficulty in walking, not elsewhere classified: Secondary | ICD-10-CM | POA: Diagnosis not present

## 2020-09-07 DIAGNOSIS — Z435 Encounter for attention to cystostomy: Secondary | ICD-10-CM | POA: Diagnosis not present

## 2020-09-07 DIAGNOSIS — M6281 Muscle weakness (generalized): Secondary | ICD-10-CM | POA: Diagnosis not present

## 2020-09-07 DIAGNOSIS — R414 Neurologic neglect syndrome: Secondary | ICD-10-CM | POA: Diagnosis not present

## 2020-09-07 DIAGNOSIS — R1312 Dysphagia, oropharyngeal phase: Secondary | ICD-10-CM | POA: Diagnosis not present

## 2020-09-07 DIAGNOSIS — R41841 Cognitive communication deficit: Secondary | ICD-10-CM | POA: Diagnosis not present

## 2020-09-07 DIAGNOSIS — R278 Other lack of coordination: Secondary | ICD-10-CM | POA: Diagnosis not present

## 2020-09-07 DIAGNOSIS — R262 Difficulty in walking, not elsewhere classified: Secondary | ICD-10-CM | POA: Diagnosis not present

## 2020-09-08 DIAGNOSIS — R41841 Cognitive communication deficit: Secondary | ICD-10-CM | POA: Diagnosis not present

## 2020-09-08 DIAGNOSIS — R1312 Dysphagia, oropharyngeal phase: Secondary | ICD-10-CM | POA: Diagnosis not present

## 2020-09-08 DIAGNOSIS — M6281 Muscle weakness (generalized): Secondary | ICD-10-CM | POA: Diagnosis not present

## 2020-09-08 DIAGNOSIS — R414 Neurologic neglect syndrome: Secondary | ICD-10-CM | POA: Diagnosis not present

## 2020-09-08 DIAGNOSIS — Z435 Encounter for attention to cystostomy: Secondary | ICD-10-CM | POA: Diagnosis not present

## 2020-09-08 DIAGNOSIS — R262 Difficulty in walking, not elsewhere classified: Secondary | ICD-10-CM | POA: Diagnosis not present

## 2020-09-08 DIAGNOSIS — R278 Other lack of coordination: Secondary | ICD-10-CM | POA: Diagnosis not present

## 2020-09-09 DIAGNOSIS — Z435 Encounter for attention to cystostomy: Secondary | ICD-10-CM | POA: Diagnosis not present

## 2020-09-09 DIAGNOSIS — Z23 Encounter for immunization: Secondary | ICD-10-CM | POA: Diagnosis not present

## 2020-09-09 DIAGNOSIS — R262 Difficulty in walking, not elsewhere classified: Secondary | ICD-10-CM | POA: Diagnosis not present

## 2020-09-09 DIAGNOSIS — M6281 Muscle weakness (generalized): Secondary | ICD-10-CM | POA: Diagnosis not present

## 2020-09-09 DIAGNOSIS — R278 Other lack of coordination: Secondary | ICD-10-CM | POA: Diagnosis not present

## 2020-09-09 DIAGNOSIS — R414 Neurologic neglect syndrome: Secondary | ICD-10-CM | POA: Diagnosis not present

## 2020-09-09 DIAGNOSIS — R1312 Dysphagia, oropharyngeal phase: Secondary | ICD-10-CM | POA: Diagnosis not present

## 2020-09-09 DIAGNOSIS — R41841 Cognitive communication deficit: Secondary | ICD-10-CM | POA: Diagnosis not present

## 2020-09-10 DIAGNOSIS — R278 Other lack of coordination: Secondary | ICD-10-CM | POA: Diagnosis not present

## 2020-09-10 DIAGNOSIS — R262 Difficulty in walking, not elsewhere classified: Secondary | ICD-10-CM | POA: Diagnosis not present

## 2020-09-10 DIAGNOSIS — R1312 Dysphagia, oropharyngeal phase: Secondary | ICD-10-CM | POA: Diagnosis not present

## 2020-09-10 DIAGNOSIS — Z435 Encounter for attention to cystostomy: Secondary | ICD-10-CM | POA: Diagnosis not present

## 2020-09-10 DIAGNOSIS — R414 Neurologic neglect syndrome: Secondary | ICD-10-CM | POA: Diagnosis not present

## 2020-09-10 DIAGNOSIS — M6281 Muscle weakness (generalized): Secondary | ICD-10-CM | POA: Diagnosis not present

## 2020-09-10 DIAGNOSIS — R41841 Cognitive communication deficit: Secondary | ICD-10-CM | POA: Diagnosis not present

## 2020-09-11 DIAGNOSIS — R414 Neurologic neglect syndrome: Secondary | ICD-10-CM | POA: Diagnosis not present

## 2020-09-11 DIAGNOSIS — R41841 Cognitive communication deficit: Secondary | ICD-10-CM | POA: Diagnosis not present

## 2020-09-11 DIAGNOSIS — Z435 Encounter for attention to cystostomy: Secondary | ICD-10-CM | POA: Diagnosis not present

## 2020-09-11 DIAGNOSIS — M6281 Muscle weakness (generalized): Secondary | ICD-10-CM | POA: Diagnosis not present

## 2020-09-11 DIAGNOSIS — R278 Other lack of coordination: Secondary | ICD-10-CM | POA: Diagnosis not present

## 2020-09-11 DIAGNOSIS — R262 Difficulty in walking, not elsewhere classified: Secondary | ICD-10-CM | POA: Diagnosis not present

## 2020-09-11 DIAGNOSIS — R1312 Dysphagia, oropharyngeal phase: Secondary | ICD-10-CM | POA: Diagnosis not present

## 2020-09-12 DIAGNOSIS — R262 Difficulty in walking, not elsewhere classified: Secondary | ICD-10-CM | POA: Diagnosis not present

## 2020-09-12 DIAGNOSIS — R1312 Dysphagia, oropharyngeal phase: Secondary | ICD-10-CM | POA: Diagnosis not present

## 2020-09-12 DIAGNOSIS — R414 Neurologic neglect syndrome: Secondary | ICD-10-CM | POA: Diagnosis not present

## 2020-09-12 DIAGNOSIS — M6281 Muscle weakness (generalized): Secondary | ICD-10-CM | POA: Diagnosis not present

## 2020-09-12 DIAGNOSIS — R41841 Cognitive communication deficit: Secondary | ICD-10-CM | POA: Diagnosis not present

## 2020-09-14 DIAGNOSIS — R41841 Cognitive communication deficit: Secondary | ICD-10-CM | POA: Diagnosis not present

## 2020-09-14 DIAGNOSIS — R1312 Dysphagia, oropharyngeal phase: Secondary | ICD-10-CM | POA: Diagnosis not present

## 2020-09-14 DIAGNOSIS — M6281 Muscle weakness (generalized): Secondary | ICD-10-CM | POA: Diagnosis not present

## 2020-09-14 DIAGNOSIS — R262 Difficulty in walking, not elsewhere classified: Secondary | ICD-10-CM | POA: Diagnosis not present

## 2020-09-14 DIAGNOSIS — R414 Neurologic neglect syndrome: Secondary | ICD-10-CM | POA: Diagnosis not present

## 2020-09-15 DIAGNOSIS — R1312 Dysphagia, oropharyngeal phase: Secondary | ICD-10-CM | POA: Diagnosis not present

## 2020-09-15 DIAGNOSIS — M6281 Muscle weakness (generalized): Secondary | ICD-10-CM | POA: Diagnosis not present

## 2020-09-15 DIAGNOSIS — R262 Difficulty in walking, not elsewhere classified: Secondary | ICD-10-CM | POA: Diagnosis not present

## 2020-09-15 DIAGNOSIS — R414 Neurologic neglect syndrome: Secondary | ICD-10-CM | POA: Diagnosis not present

## 2020-09-15 DIAGNOSIS — R41841 Cognitive communication deficit: Secondary | ICD-10-CM | POA: Diagnosis not present

## 2020-09-16 DIAGNOSIS — M6281 Muscle weakness (generalized): Secondary | ICD-10-CM | POA: Diagnosis not present

## 2020-09-16 DIAGNOSIS — R262 Difficulty in walking, not elsewhere classified: Secondary | ICD-10-CM | POA: Diagnosis not present

## 2020-09-16 DIAGNOSIS — R1312 Dysphagia, oropharyngeal phase: Secondary | ICD-10-CM | POA: Diagnosis not present

## 2020-09-16 DIAGNOSIS — R414 Neurologic neglect syndrome: Secondary | ICD-10-CM | POA: Diagnosis not present

## 2020-09-16 DIAGNOSIS — R41841 Cognitive communication deficit: Secondary | ICD-10-CM | POA: Diagnosis not present

## 2020-09-17 DIAGNOSIS — R41841 Cognitive communication deficit: Secondary | ICD-10-CM | POA: Diagnosis not present

## 2020-09-17 DIAGNOSIS — R414 Neurologic neglect syndrome: Secondary | ICD-10-CM | POA: Diagnosis not present

## 2020-09-17 DIAGNOSIS — R1312 Dysphagia, oropharyngeal phase: Secondary | ICD-10-CM | POA: Diagnosis not present

## 2020-09-17 DIAGNOSIS — M6281 Muscle weakness (generalized): Secondary | ICD-10-CM | POA: Diagnosis not present

## 2020-09-17 DIAGNOSIS — R262 Difficulty in walking, not elsewhere classified: Secondary | ICD-10-CM | POA: Diagnosis not present

## 2020-09-18 DIAGNOSIS — R262 Difficulty in walking, not elsewhere classified: Secondary | ICD-10-CM | POA: Diagnosis not present

## 2020-09-18 DIAGNOSIS — R1312 Dysphagia, oropharyngeal phase: Secondary | ICD-10-CM | POA: Diagnosis not present

## 2020-09-18 DIAGNOSIS — M6281 Muscle weakness (generalized): Secondary | ICD-10-CM | POA: Diagnosis not present

## 2020-09-18 DIAGNOSIS — R41841 Cognitive communication deficit: Secondary | ICD-10-CM | POA: Diagnosis not present

## 2020-09-18 DIAGNOSIS — R414 Neurologic neglect syndrome: Secondary | ICD-10-CM | POA: Diagnosis not present

## 2020-09-19 DIAGNOSIS — R414 Neurologic neglect syndrome: Secondary | ICD-10-CM | POA: Diagnosis not present

## 2020-09-19 DIAGNOSIS — R1312 Dysphagia, oropharyngeal phase: Secondary | ICD-10-CM | POA: Diagnosis not present

## 2020-09-19 DIAGNOSIS — M6281 Muscle weakness (generalized): Secondary | ICD-10-CM | POA: Diagnosis not present

## 2020-09-19 DIAGNOSIS — R262 Difficulty in walking, not elsewhere classified: Secondary | ICD-10-CM | POA: Diagnosis not present

## 2020-09-19 DIAGNOSIS — R41841 Cognitive communication deficit: Secondary | ICD-10-CM | POA: Diagnosis not present

## 2020-09-22 DIAGNOSIS — M6281 Muscle weakness (generalized): Secondary | ICD-10-CM | POA: Diagnosis not present

## 2020-09-22 DIAGNOSIS — R262 Difficulty in walking, not elsewhere classified: Secondary | ICD-10-CM | POA: Diagnosis not present

## 2020-09-22 DIAGNOSIS — R1312 Dysphagia, oropharyngeal phase: Secondary | ICD-10-CM | POA: Diagnosis not present

## 2020-09-22 DIAGNOSIS — R41841 Cognitive communication deficit: Secondary | ICD-10-CM | POA: Diagnosis not present

## 2020-09-22 DIAGNOSIS — R414 Neurologic neglect syndrome: Secondary | ICD-10-CM | POA: Diagnosis not present

## 2020-09-23 DIAGNOSIS — R41841 Cognitive communication deficit: Secondary | ICD-10-CM | POA: Diagnosis not present

## 2020-09-23 DIAGNOSIS — R262 Difficulty in walking, not elsewhere classified: Secondary | ICD-10-CM | POA: Diagnosis not present

## 2020-09-23 DIAGNOSIS — R1312 Dysphagia, oropharyngeal phase: Secondary | ICD-10-CM | POA: Diagnosis not present

## 2020-09-23 DIAGNOSIS — R414 Neurologic neglect syndrome: Secondary | ICD-10-CM | POA: Diagnosis not present

## 2020-09-23 DIAGNOSIS — M6281 Muscle weakness (generalized): Secondary | ICD-10-CM | POA: Diagnosis not present

## 2020-09-24 ENCOUNTER — Telehealth: Payer: Self-pay

## 2020-09-24 DIAGNOSIS — R69 Illness, unspecified: Secondary | ICD-10-CM | POA: Diagnosis not present

## 2020-09-24 DIAGNOSIS — R262 Difficulty in walking, not elsewhere classified: Secondary | ICD-10-CM | POA: Diagnosis not present

## 2020-09-24 DIAGNOSIS — R1312 Dysphagia, oropharyngeal phase: Secondary | ICD-10-CM | POA: Diagnosis not present

## 2020-09-24 DIAGNOSIS — R41841 Cognitive communication deficit: Secondary | ICD-10-CM | POA: Diagnosis not present

## 2020-09-24 DIAGNOSIS — M6281 Muscle weakness (generalized): Secondary | ICD-10-CM | POA: Diagnosis not present

## 2020-09-24 DIAGNOSIS — R414 Neurologic neglect syndrome: Secondary | ICD-10-CM | POA: Diagnosis not present

## 2020-09-24 NOTE — Telephone Encounter (Signed)
Theresa Hunt with [facility] called-  Family requested for upcoming appt to be cancelled. Recent decline in health and mental status with dementia. Patient is not having any issues with her urostomy. Facility is maintaining care.  Family will call back if urology assistance Is needed.

## 2020-09-25 DIAGNOSIS — R41841 Cognitive communication deficit: Secondary | ICD-10-CM | POA: Diagnosis not present

## 2020-09-25 DIAGNOSIS — R414 Neurologic neglect syndrome: Secondary | ICD-10-CM | POA: Diagnosis not present

## 2020-09-25 DIAGNOSIS — R262 Difficulty in walking, not elsewhere classified: Secondary | ICD-10-CM | POA: Diagnosis not present

## 2020-09-25 DIAGNOSIS — R1312 Dysphagia, oropharyngeal phase: Secondary | ICD-10-CM | POA: Diagnosis not present

## 2020-09-25 DIAGNOSIS — M6281 Muscle weakness (generalized): Secondary | ICD-10-CM | POA: Diagnosis not present

## 2020-09-26 DIAGNOSIS — R41841 Cognitive communication deficit: Secondary | ICD-10-CM | POA: Diagnosis not present

## 2020-09-26 DIAGNOSIS — R262 Difficulty in walking, not elsewhere classified: Secondary | ICD-10-CM | POA: Diagnosis not present

## 2020-09-26 DIAGNOSIS — M6281 Muscle weakness (generalized): Secondary | ICD-10-CM | POA: Diagnosis not present

## 2020-09-26 DIAGNOSIS — R414 Neurologic neglect syndrome: Secondary | ICD-10-CM | POA: Diagnosis not present

## 2020-09-26 DIAGNOSIS — R1312 Dysphagia, oropharyngeal phase: Secondary | ICD-10-CM | POA: Diagnosis not present

## 2020-09-27 DIAGNOSIS — M6281 Muscle weakness (generalized): Secondary | ICD-10-CM | POA: Diagnosis not present

## 2020-09-27 DIAGNOSIS — R262 Difficulty in walking, not elsewhere classified: Secondary | ICD-10-CM | POA: Diagnosis not present

## 2020-09-27 DIAGNOSIS — R414 Neurologic neglect syndrome: Secondary | ICD-10-CM | POA: Diagnosis not present

## 2020-09-27 DIAGNOSIS — R41841 Cognitive communication deficit: Secondary | ICD-10-CM | POA: Diagnosis not present

## 2020-09-27 DIAGNOSIS — R1312 Dysphagia, oropharyngeal phase: Secondary | ICD-10-CM | POA: Diagnosis not present

## 2020-09-28 DIAGNOSIS — R41841 Cognitive communication deficit: Secondary | ICD-10-CM | POA: Diagnosis not present

## 2020-09-28 DIAGNOSIS — R262 Difficulty in walking, not elsewhere classified: Secondary | ICD-10-CM | POA: Diagnosis not present

## 2020-09-28 DIAGNOSIS — R414 Neurologic neglect syndrome: Secondary | ICD-10-CM | POA: Diagnosis not present

## 2020-09-28 DIAGNOSIS — M6281 Muscle weakness (generalized): Secondary | ICD-10-CM | POA: Diagnosis not present

## 2020-09-28 DIAGNOSIS — R1312 Dysphagia, oropharyngeal phase: Secondary | ICD-10-CM | POA: Diagnosis not present

## 2020-09-29 DIAGNOSIS — R414 Neurologic neglect syndrome: Secondary | ICD-10-CM | POA: Diagnosis not present

## 2020-09-29 DIAGNOSIS — M6281 Muscle weakness (generalized): Secondary | ICD-10-CM | POA: Diagnosis not present

## 2020-09-29 DIAGNOSIS — R41841 Cognitive communication deficit: Secondary | ICD-10-CM | POA: Diagnosis not present

## 2020-09-29 DIAGNOSIS — R1312 Dysphagia, oropharyngeal phase: Secondary | ICD-10-CM | POA: Diagnosis not present

## 2020-09-29 DIAGNOSIS — R262 Difficulty in walking, not elsewhere classified: Secondary | ICD-10-CM | POA: Diagnosis not present

## 2020-09-30 DIAGNOSIS — E119 Type 2 diabetes mellitus without complications: Secondary | ICD-10-CM | POA: Diagnosis not present

## 2020-10-01 DIAGNOSIS — R414 Neurologic neglect syndrome: Secondary | ICD-10-CM | POA: Diagnosis not present

## 2020-10-01 DIAGNOSIS — R262 Difficulty in walking, not elsewhere classified: Secondary | ICD-10-CM | POA: Diagnosis not present

## 2020-10-01 DIAGNOSIS — R41841 Cognitive communication deficit: Secondary | ICD-10-CM | POA: Diagnosis not present

## 2020-10-01 DIAGNOSIS — R1312 Dysphagia, oropharyngeal phase: Secondary | ICD-10-CM | POA: Diagnosis not present

## 2020-10-01 DIAGNOSIS — M6281 Muscle weakness (generalized): Secondary | ICD-10-CM | POA: Diagnosis not present

## 2020-10-13 DEATH — deceased

## 2020-10-30 ENCOUNTER — Ambulatory Visit (HOSPITAL_COMMUNITY): Payer: Medicare HMO

## 2020-11-06 ENCOUNTER — Ambulatory Visit: Payer: Medicare HMO | Admitting: Urology

## 2020-11-07 ENCOUNTER — Ambulatory Visit: Payer: Medicare HMO | Admitting: Urology

## 2022-09-04 IMAGING — DX DG CHEST 1V PORT
1 series · 1 of 1 positions shown · non-contrast
Comparison: Report 05/25/2012

CLINICAL DATA: Possible sepsis found on floor

EXAM:
PORTABLE CHEST 1 VIEW

[chest ap]
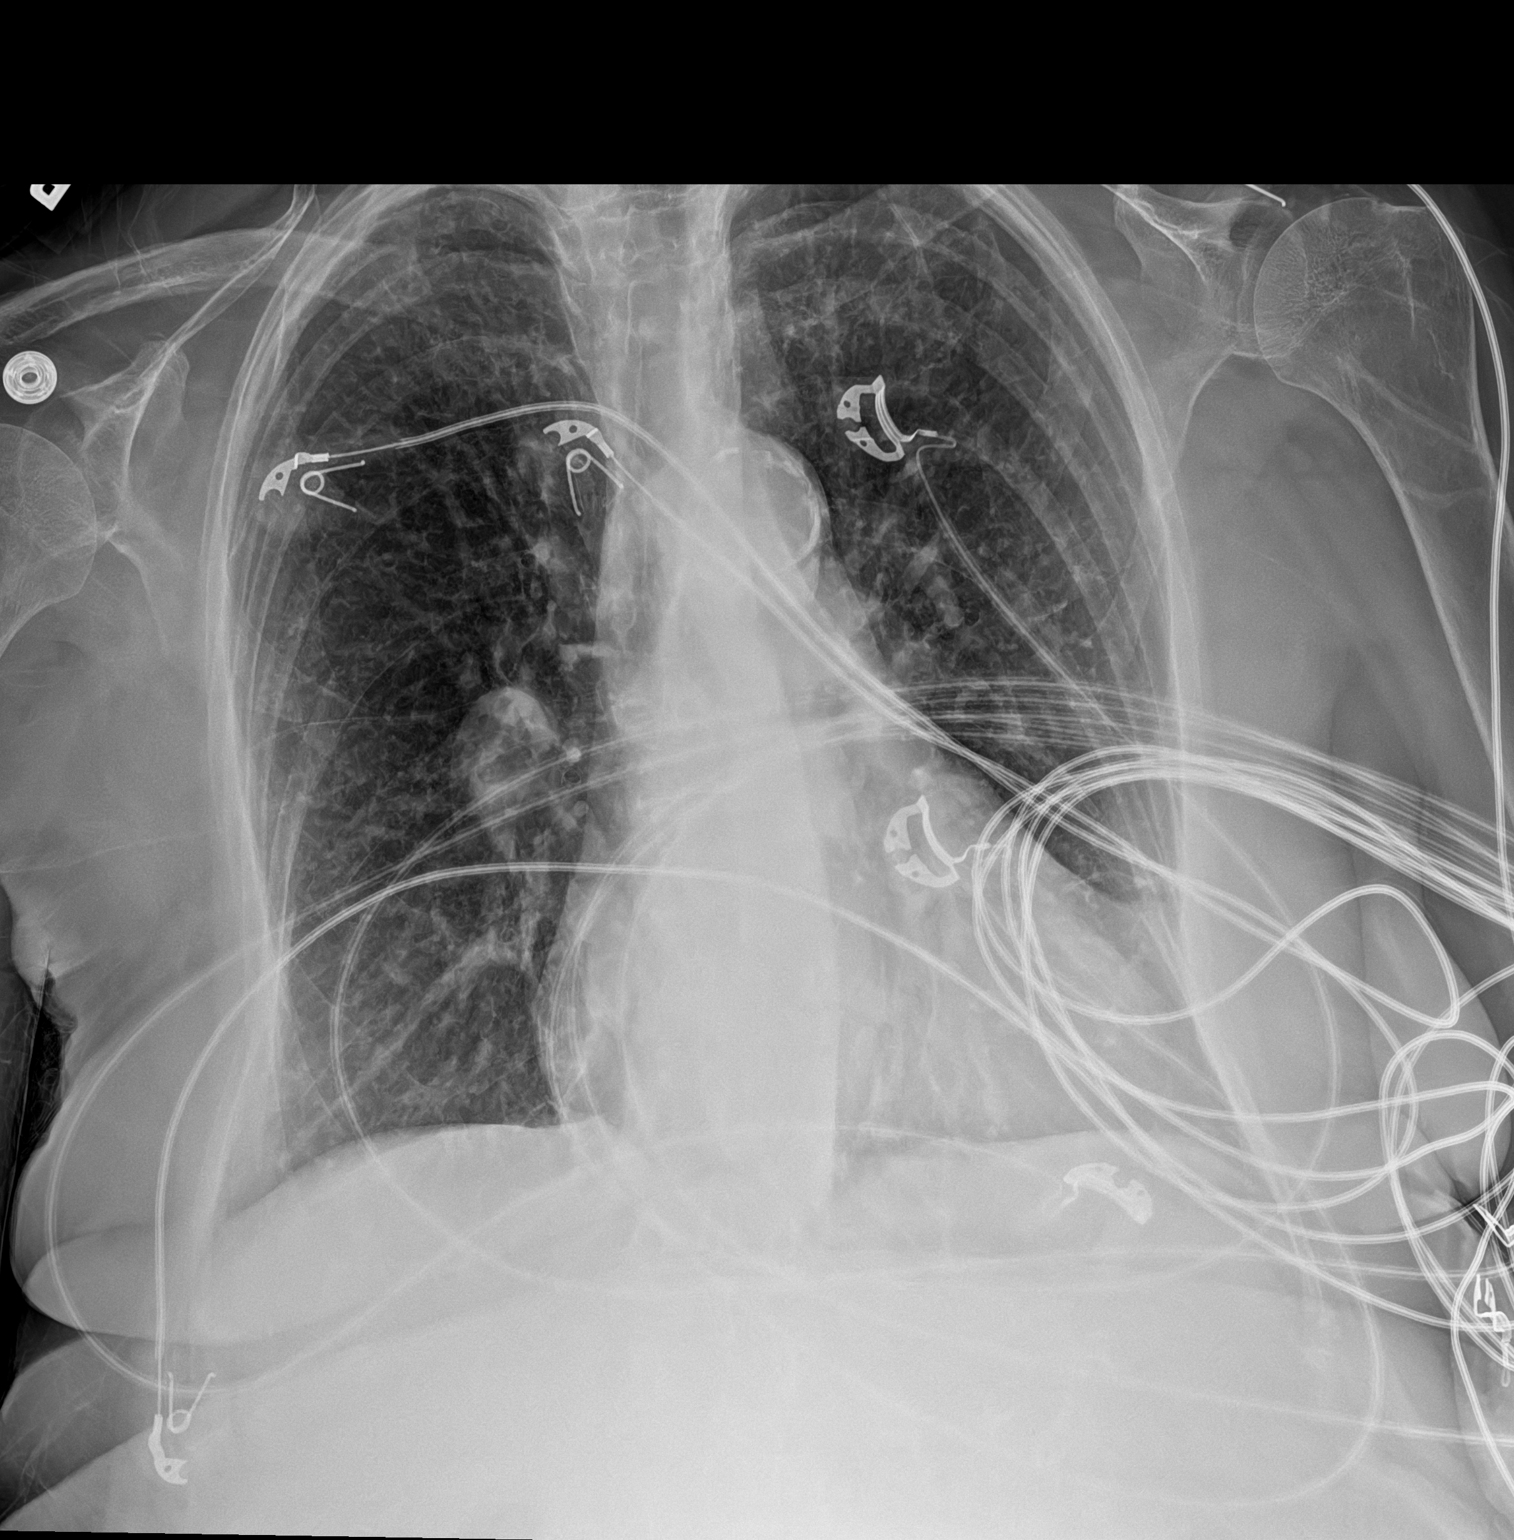

[1 of 1 positions shown; findings below may reference images not displayed]

FINDINGS: No consolidation or effusion. Mild cardiomegaly with aortic
atherosclerosis. Diffuse coarse interstitial opacity likely chronic
bronchitic change. Ovoid opacity in the right hilus likely due to
hilar vessel. No pneumothorax
IMPRESSION: No active cardiopulmonary disease. Mild cardiomegaly with probable
chronic bronchitic changes. Ovoid opacity in the right hilus
suspected to be secondary to hilar vessel.

## 2023-01-08 IMAGING — MR MR MRA HEAD W/O CM
1 series · 33 of 48 positions shown · non-contrast
Comparison: MR head without contrast from CT head without contrast
06/13/20

CLINICAL DATA: TIA.

EXAM:
MRA HEAD WITHOUT CONTRAST
TECHNIQUE: Angiographic images of the Circle of Willis were obtained using MRA
technique without intravenous contrast.

[Series 2: TOF · axial · 0.5mm · 0.54mm/px · z∈[-70,+4]mm · 33 of 168 slices shown]
[im 1/168]
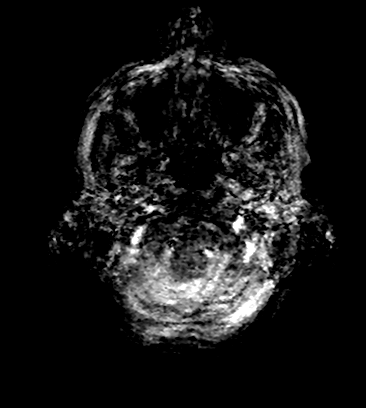
[im 4/168]
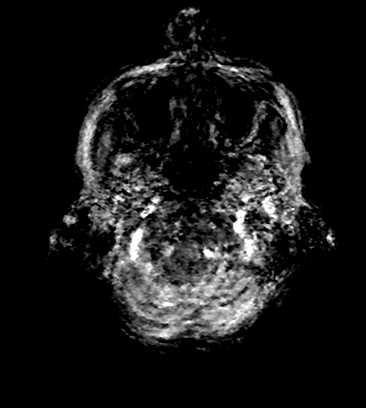
[im 8/168]
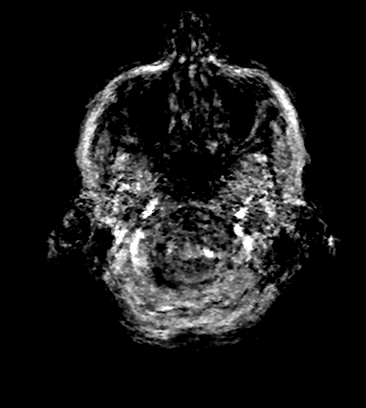
[im 11/168]
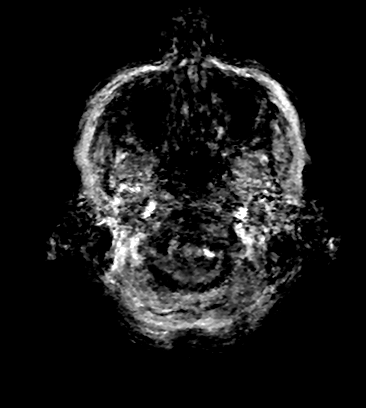
[im 15/168]
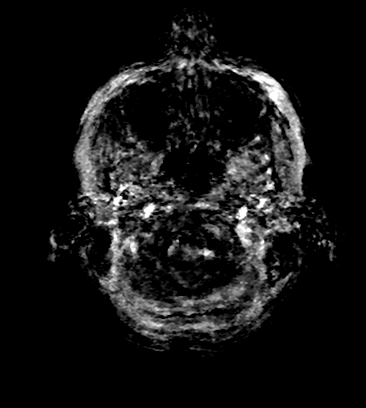
[im 18/168]
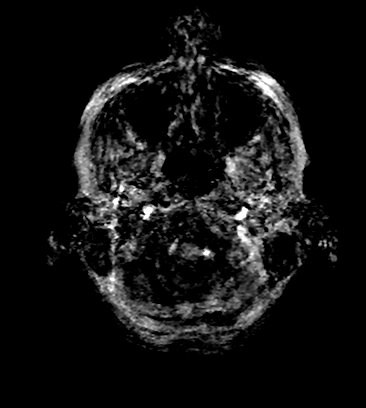
[im 22/168]
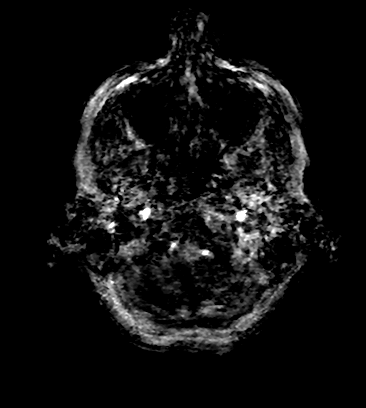
[im 25/168]
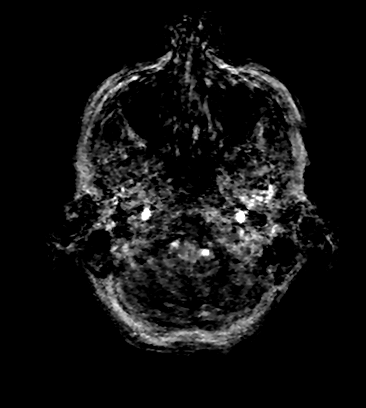
[im 29/168]
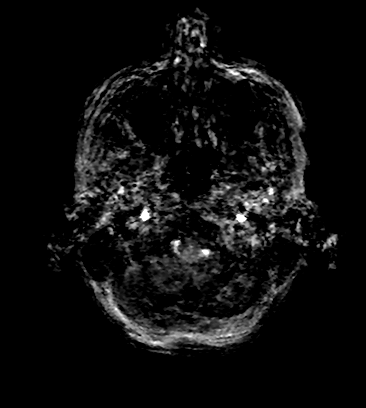
[im 32/168]
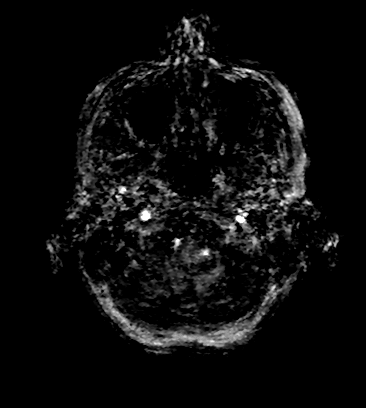
[im 36/168]
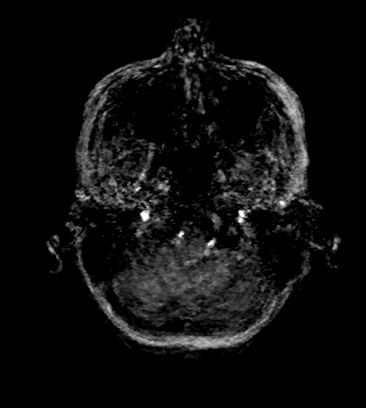
[im 40/168]
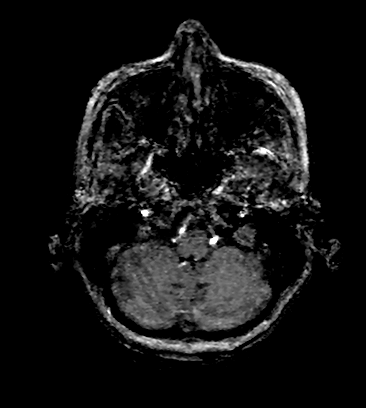
[im 43/168]
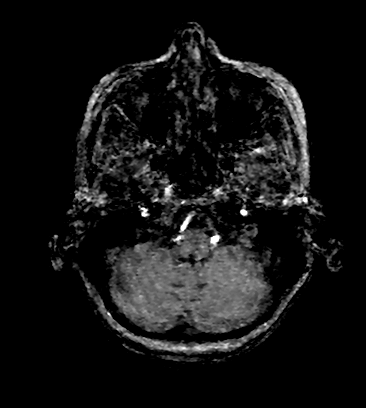
[im 47/168]
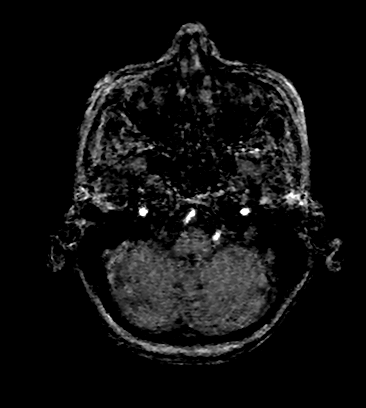
[im 50/168]
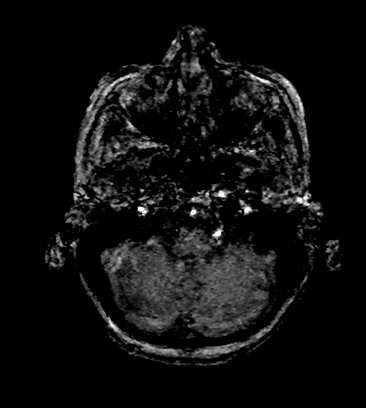
[im 54/168]
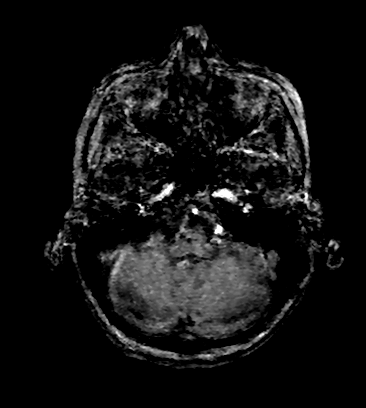
[im 57/168]
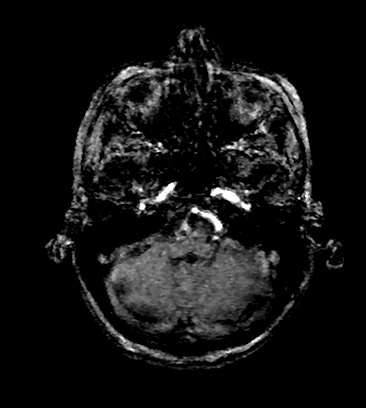
[im 61/168]
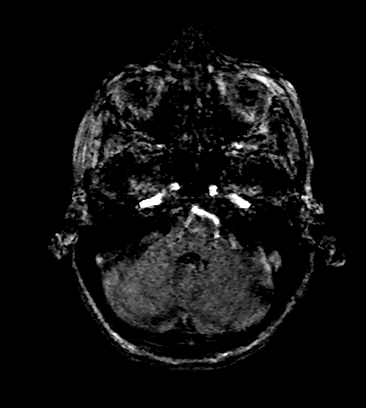
[im 64/168]
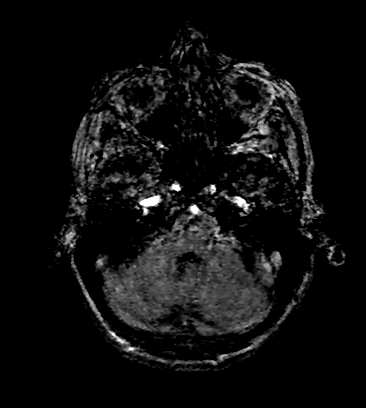
[im 68/168]
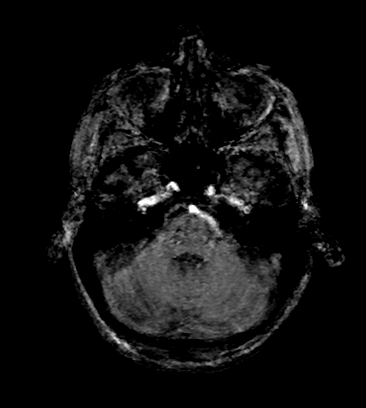
[im 72/168]
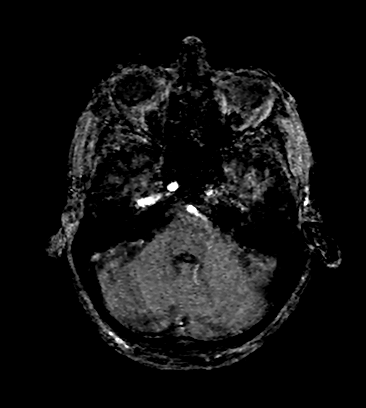
[im 75/168]
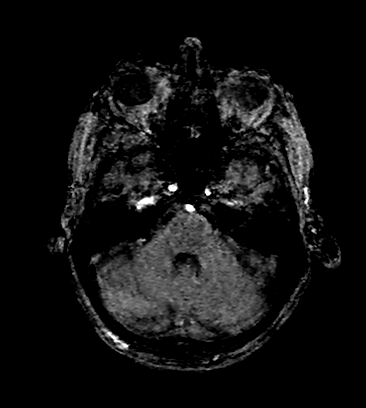
[im 79/168]
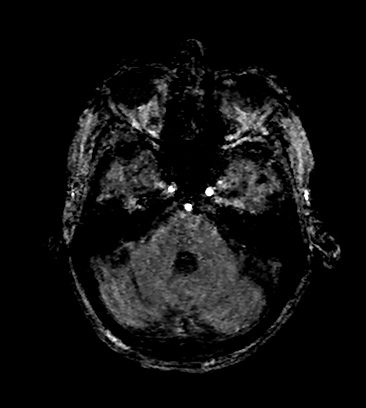
[im 82/168]
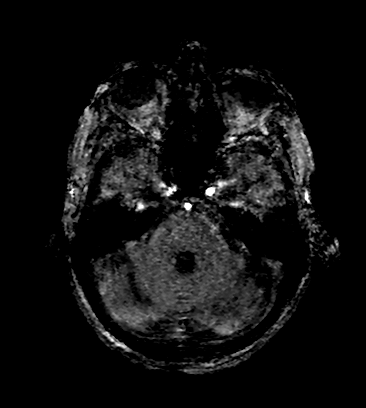
[im 86/168]
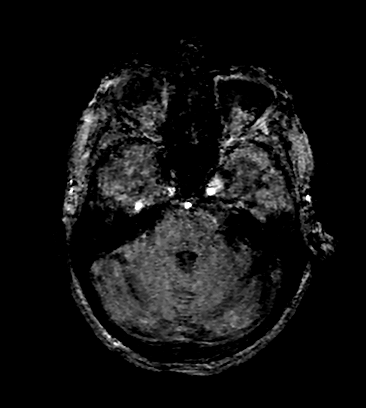
[im 89/168]
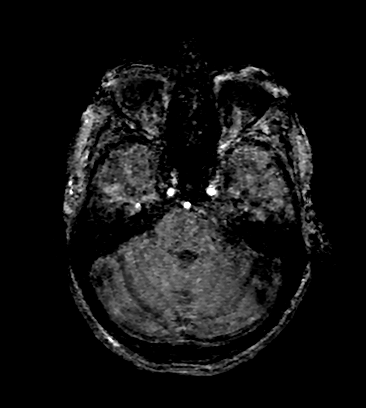
[im 93/168]
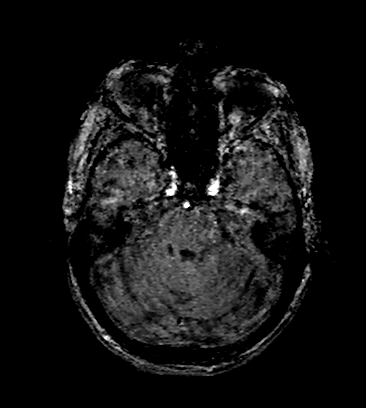
[im 96/168]
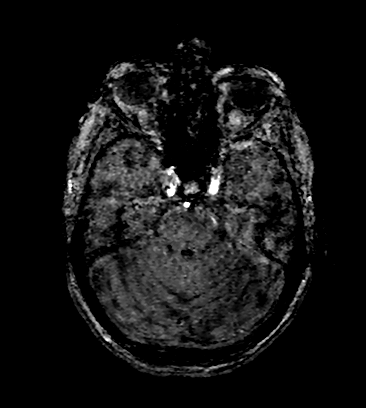
[im 100/168]
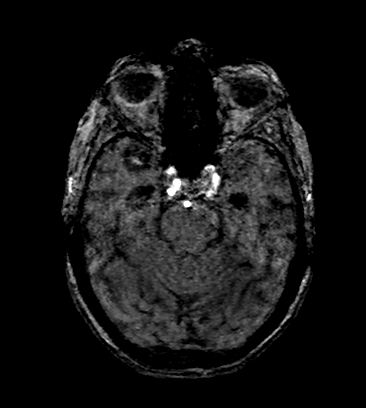
[im 118/168]
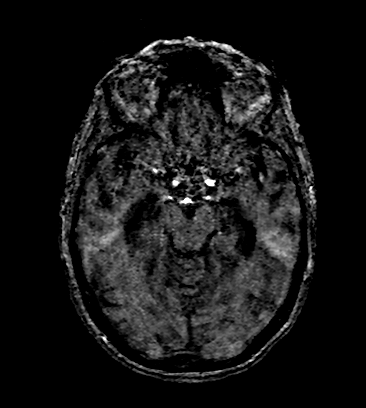
[im 139/168]
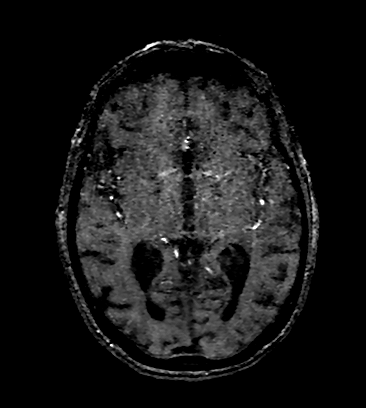
[im 143/168]
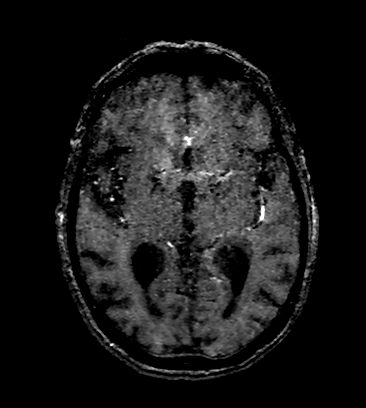
[im 160/168]
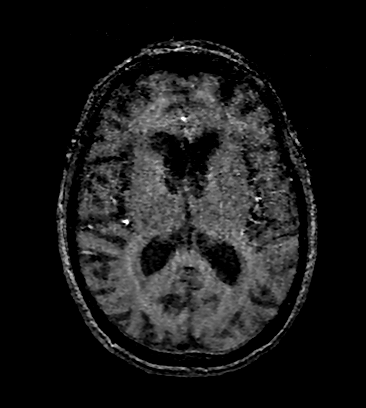

[33 of 48 positions shown; findings below may reference images not displayed]

FINDINGS: Study is severely degraded by patient motion. Lower portion of the
study demonstrates no vessel occlusion through the cavernous
segments. Signal loss in the distal M1 segments is likely
artifactual. There is some flow visualized within branch vessels.

Vertebral arteries are codominant. Basilar artery demonstrates flow.
Significant signal loss is present in the proximal PCA vessels
bilaterally.
IMPRESSION: 1. Severely motion degraded exam.
2. No definite large vessel occlusion. Study is nondiagnostic for
stenosis.

## 2023-01-08 IMAGING — MR MR HEAD W/O CM
9 of 10 series · 40 of 48 positions shown · non-contrast
Comparison: None.

CLINICAL DATA: Right-sided weakness

EXAM:
MRI HEAD WITHOUT CONTRAST
TECHNIQUE: Multiplanar, multiecho pulse sequences of the brain and surrounding
structures were obtained without intravenous contrast.

[Series 5: DWI · coronal · 5.0mm · 0.88mm/px · 5 of 28 slices shown (1 of 4)]
[im 1/28]
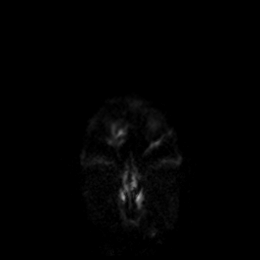
[im 7/28]
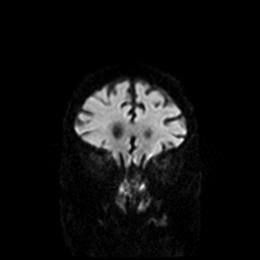
[im 14/28]
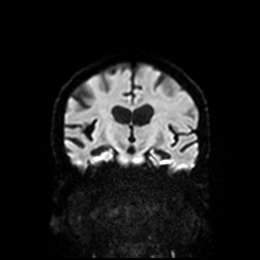
[im 21/28]
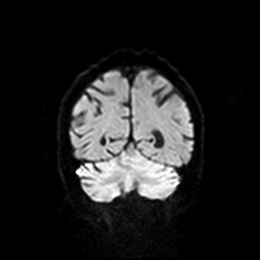
[im 28/28]
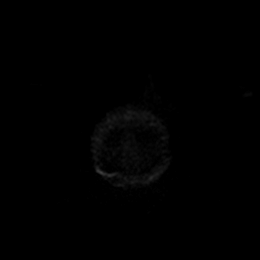

[Series 6: DWI · coronal · 5.0mm · 0.88mm/px · 4 of 28 slices shown (2 of 4)]
[im 1/28]
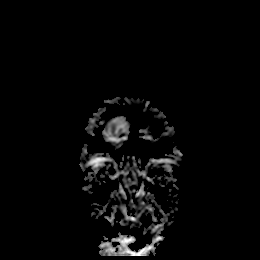
[im 10/28]
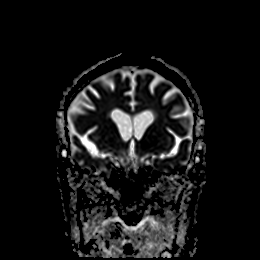
[im 19/28]
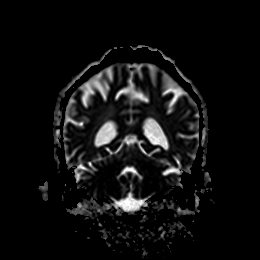
[im 28/28]
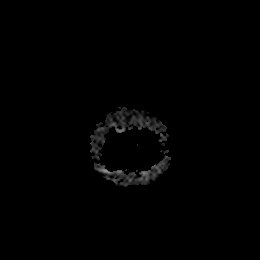

[Series 7: DWI · axial · 4.0mm · 0.88mm/px · z∈[-41,+99]mm · 6 of 36 slices shown (3 of 4)]
[im 1/36]
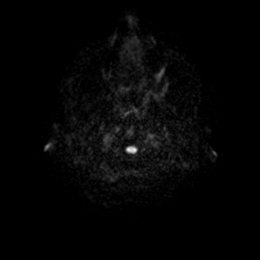
[im 8/36]
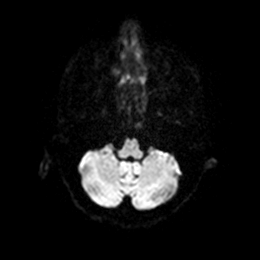
[im 15/36]
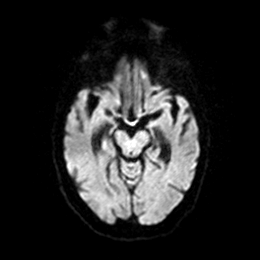
[im 22/36]
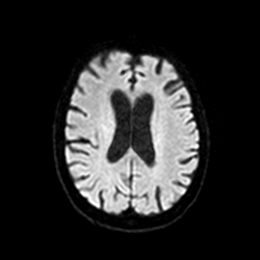
[im 29/36]
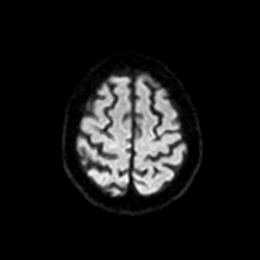
[im 36/36]
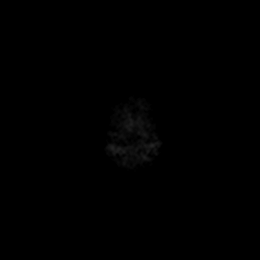

[Series 8: DWI · axial · 4.0mm · 0.88mm/px · z∈[-41,+99]mm · 6 of 36 slices shown (4 of 4)]
[im 1/36]
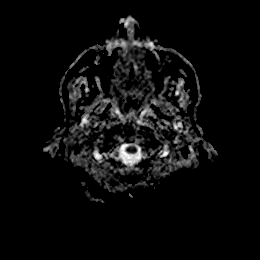
[im 8/36]
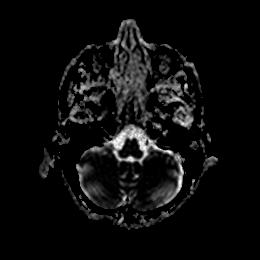
[im 15/36]
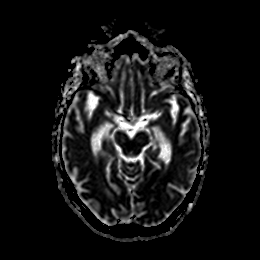
[im 22/36]
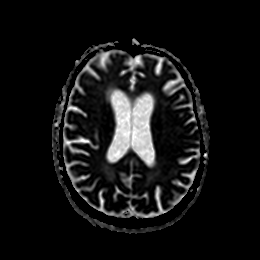
[im 29/36]
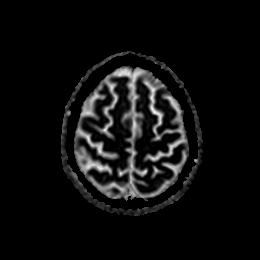
[im 36/36]
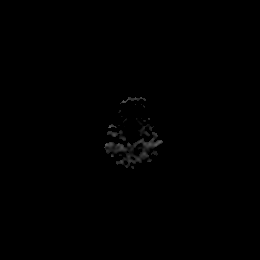

[Series 9: T1 · sagittal · 5.0mm · 0.75mm/px · 3 of 19 slices shown]
[im 1/19]
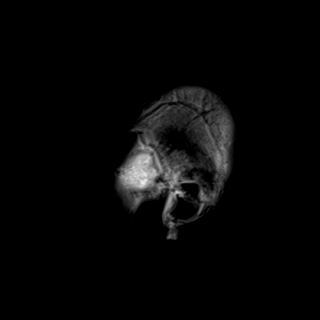
[im 10/19]
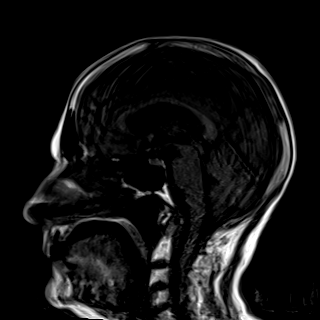
[im 19/19]
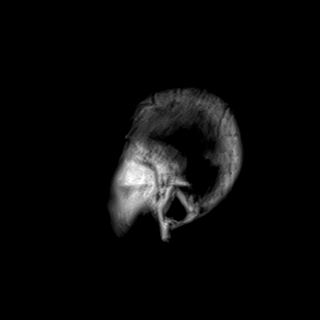

[Series 10: T2 · axial · 5.0mm · 0.72mm/px · z∈[-47,+100]mm · 3 of 22 slices shown (1 of 2)]
[im 1/22]
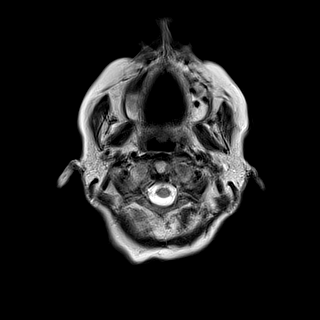
[im 11/22]
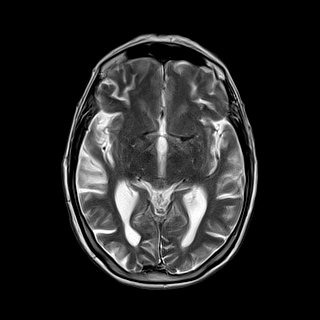
[im 22/22]
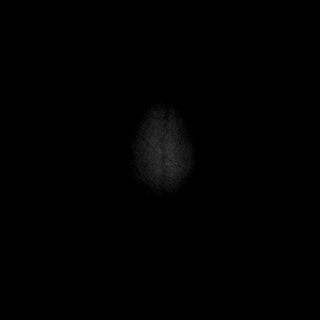

[Series 11: ax hemo · axial · 5.0mm · 0.86mm/px · z∈[-45,+99]mm · 4 of 25 slices shown]
[im 1/25]
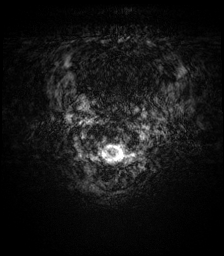
[im 9/25]
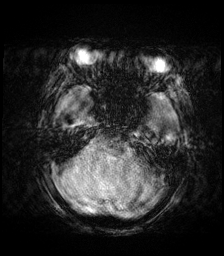
[im 17/25]
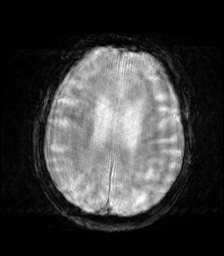
[im 25/25]
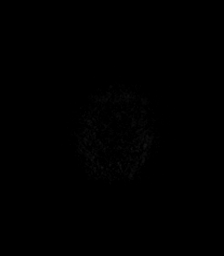

[Series 12: FLAIR · axial · 4.0mm · 0.43mm/px · z∈[-39,+93]mm · 5 of 34 slices shown]
[im 1/34]
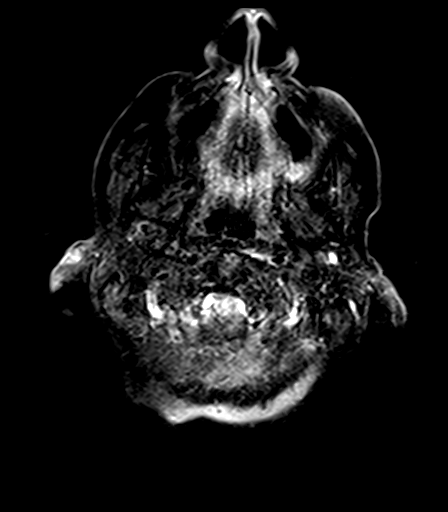
[im 9/34]
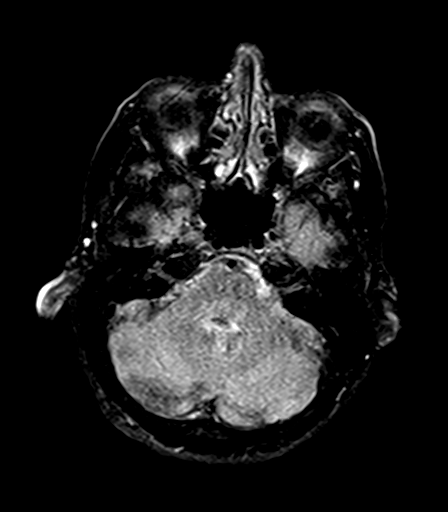
[im 17/34]
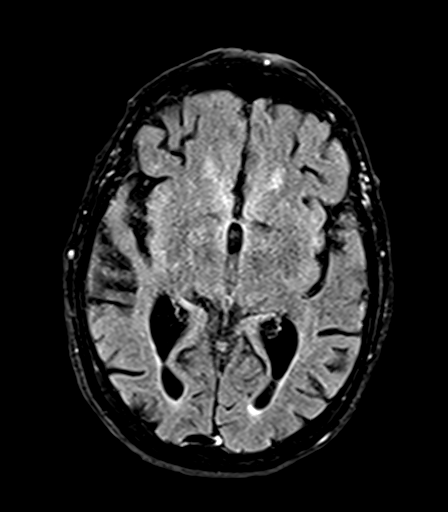
[im 25/34]
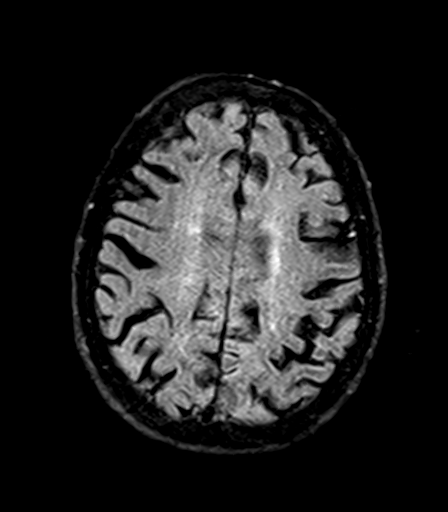
[im 34/34]
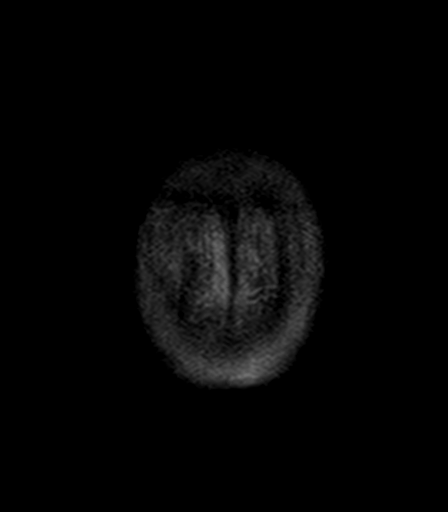

[Series 14: T2 · coronal · 5.0mm · 0.72mm/px · 4 of 28 slices shown (2 of 2)]
[im 1/28]
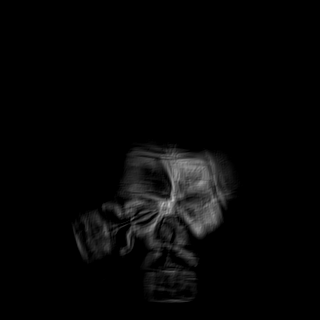
[im 10/28]
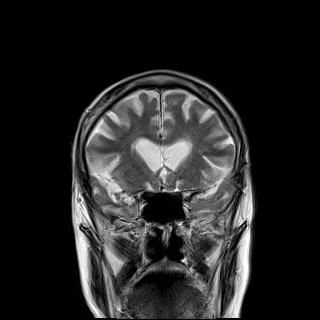
[im 19/28]
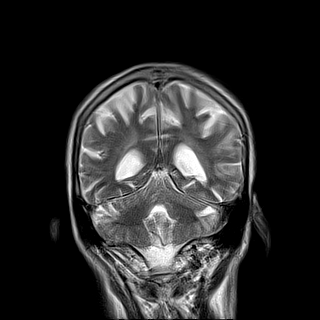
[im 28/28]
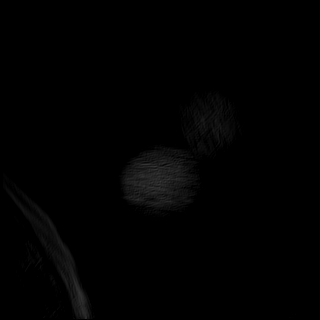

[40 of 48 positions shown; findings below may reference images not displayed]

FINDINGS: Motion artifact is present. Findings below are within this
limitation.

Brain: There is no acute infarction or intracranial hemorrhage.
There is no intracranial mass, mass effect, or edema. There is no
extra-axial fluid collection. Prominence of the ventricles and sulci
reflects generalized parenchymal volume loss. Relative prominence of
the lateral ventricles, particularly the temporal horns is favored
to be on an ex vacuo basis rather than hydrocephalus. Patchy foci of
T2 hyperintensity in the supratentorial white matter are nonspecific
but may reflect chronic microvascular ischemic changes.

Vascular: Major vessel flow voids at the skull base are preserved.

Skull and upper cervical spine: Normal marrow signal is preserved.

Sinuses/Orbits: Paranasal sinuses are aerated. Orbits are
unremarkable.

Other: Sella is unremarkable.  Mastoid air cells are clear.
IMPRESSION: Significant motion artifact is present. There is no acute
infarction.

## 2023-01-08 IMAGING — CT CT HEAD W/O CM
3 series · 16 of 47 positions shown, 19 images · non-contrast
Comparison: None.

CLINICAL DATA: Right arm paresis.

EXAM:
CT HEAD WITHOUT CONTRAST
TECHNIQUE: Contiguous axial images were obtained from the base of the skull
through the vertex without intravenous contrast.

[Series 2: head w o · axial · 0.41mm/px · z∈[-8,+117]mm · 10 of 31 slices shown, 13 images]
[im 3/31  brain]
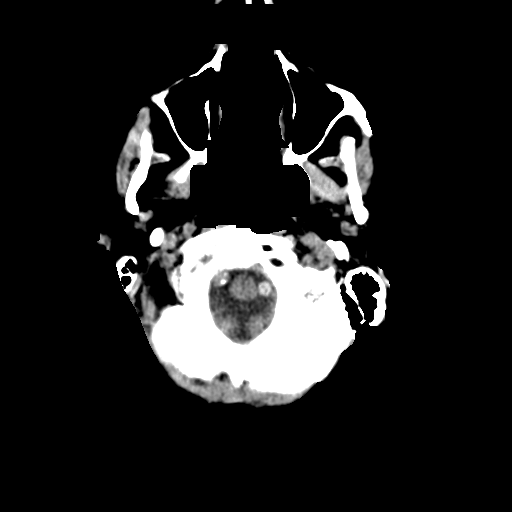
[im 3/31  bone]
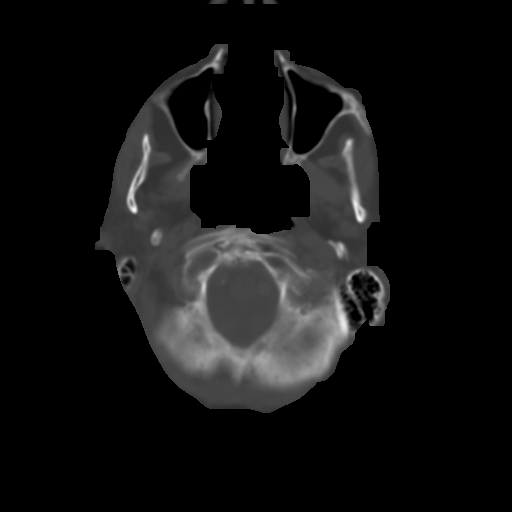
[im 6/31  brain]
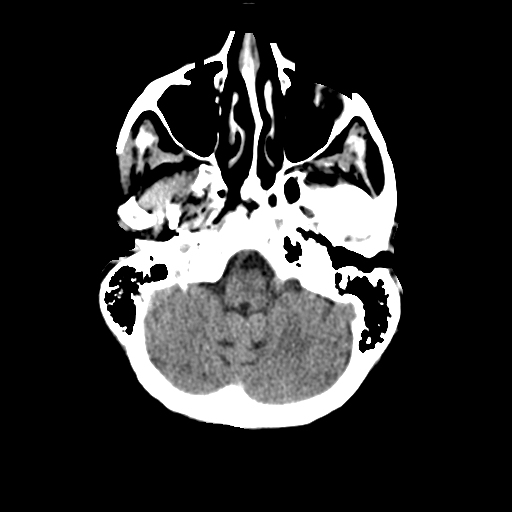
[im 9/31  brain]
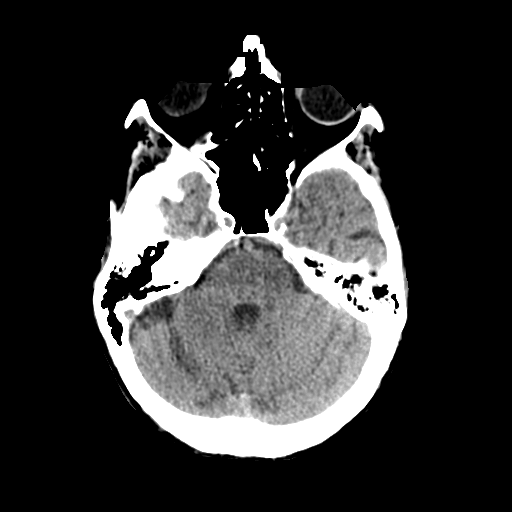
[im 11/31  brain]
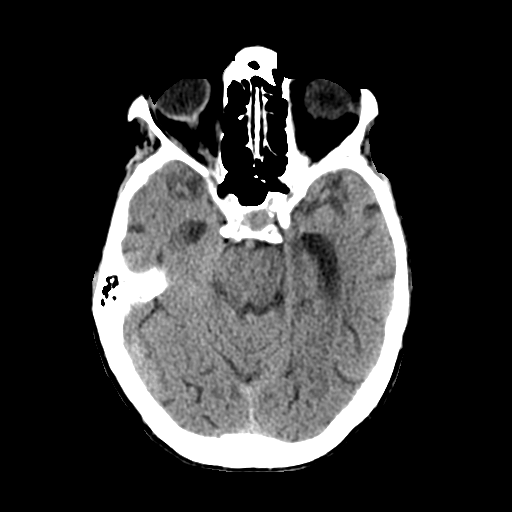
[im 14/31  brain]
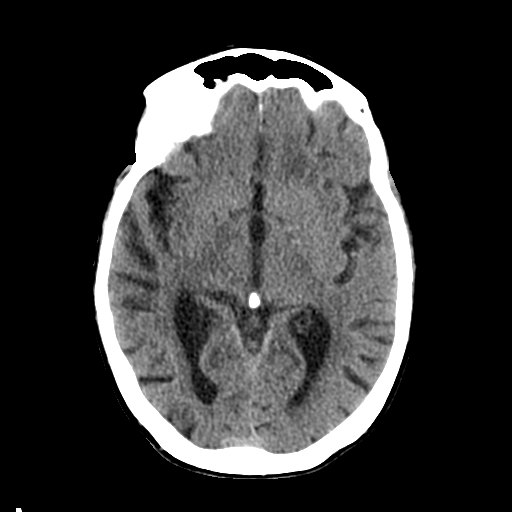
[im 14/31  bone]
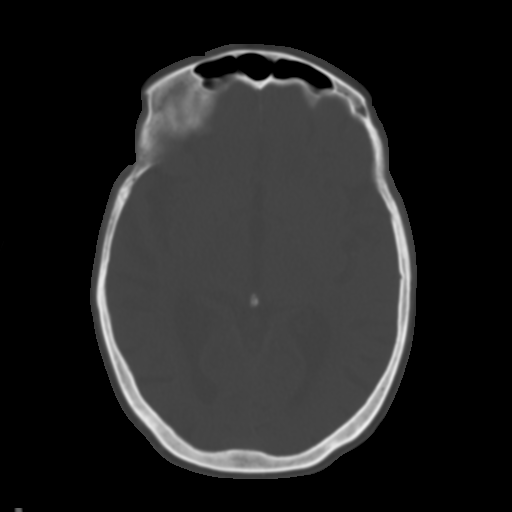
[im 17/31  brain]
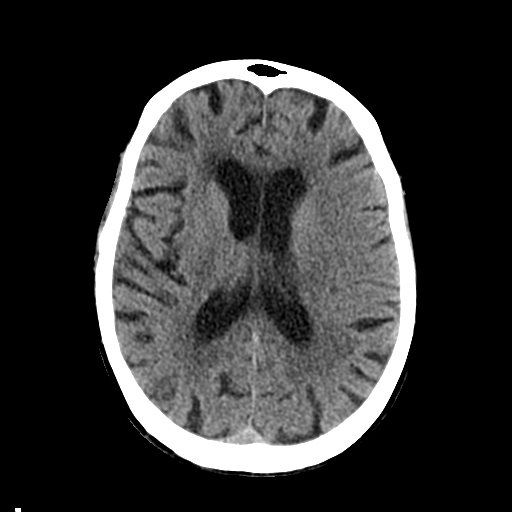
[im 20/31  brain]
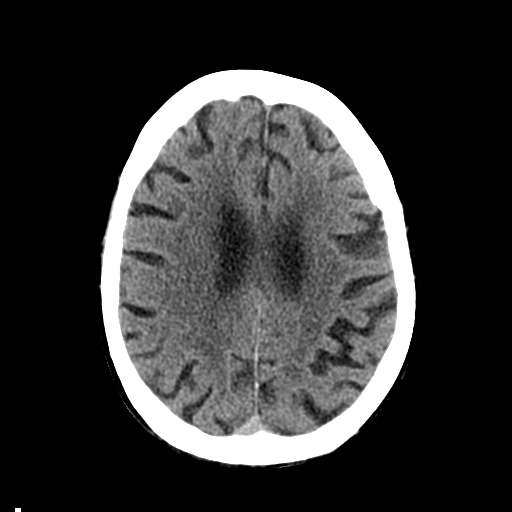
[im 23/31  brain]
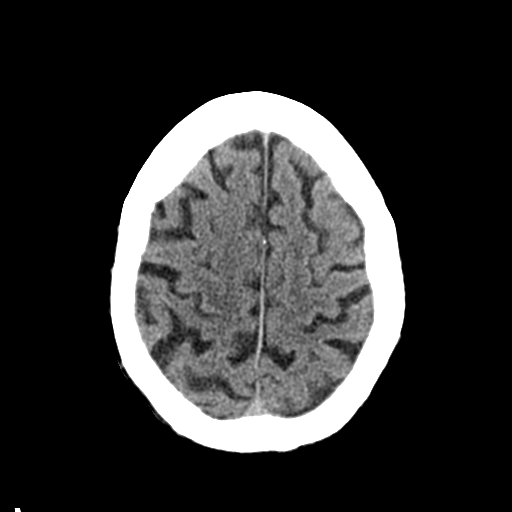
[im 25/31  brain]
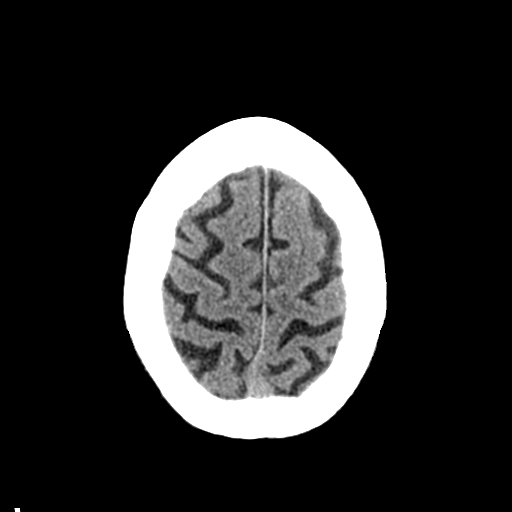
[im 25/31  bone]
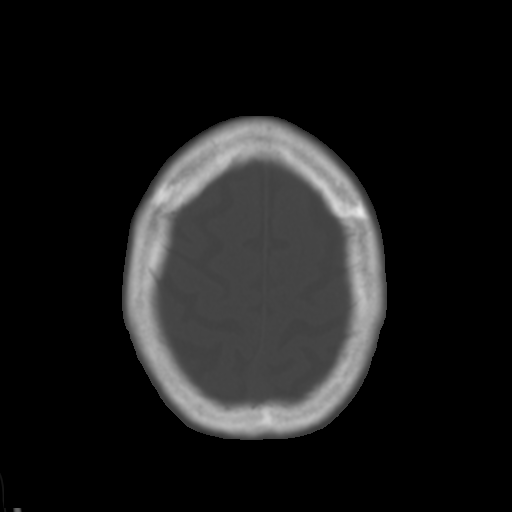
[im 28/31  brain]
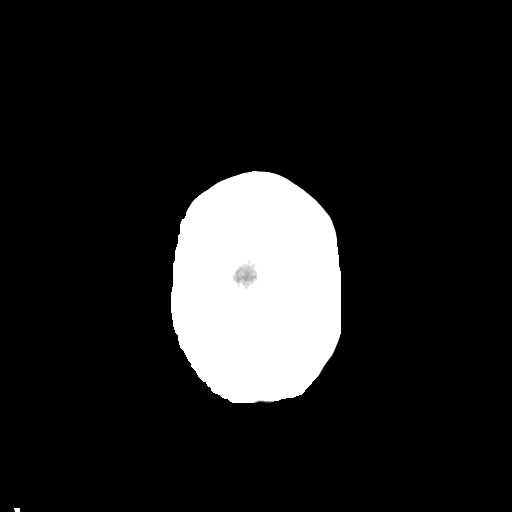

[Series 4: coronal soft · coronal · 0.32mm/px · 3 of 70 slices shown]
[im 24/70  brain]
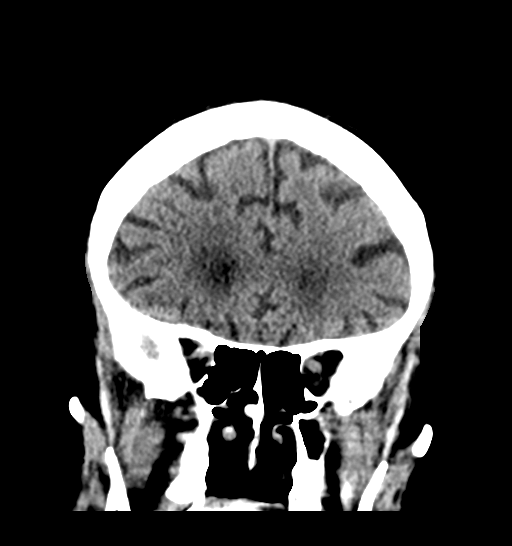
[im 31/70  brain]
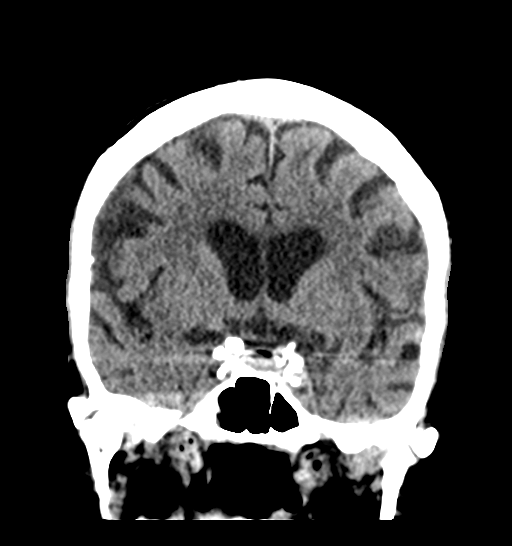
[im 39/70  brain]
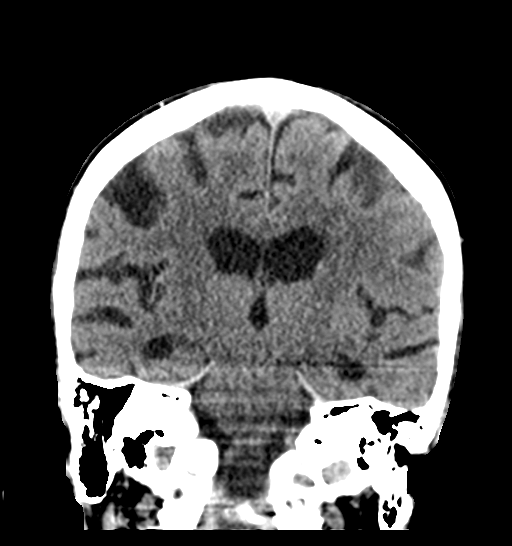

[Series 5: sagittal soft · sagittal · 0.34mm/px · 3 of 52 slices shown]
[im 18/52  brain]
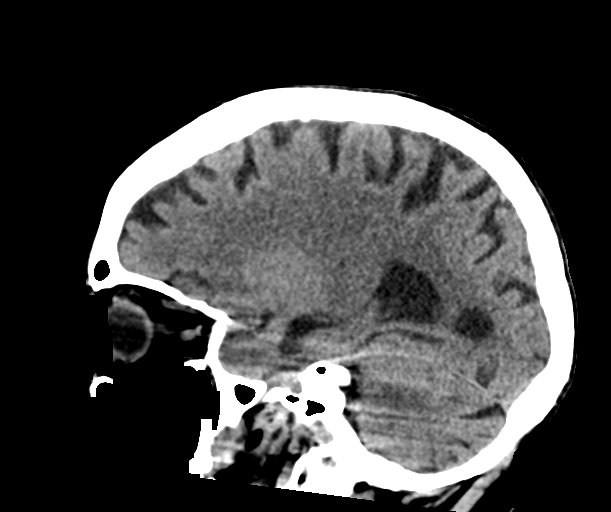
[im 26/52  brain]
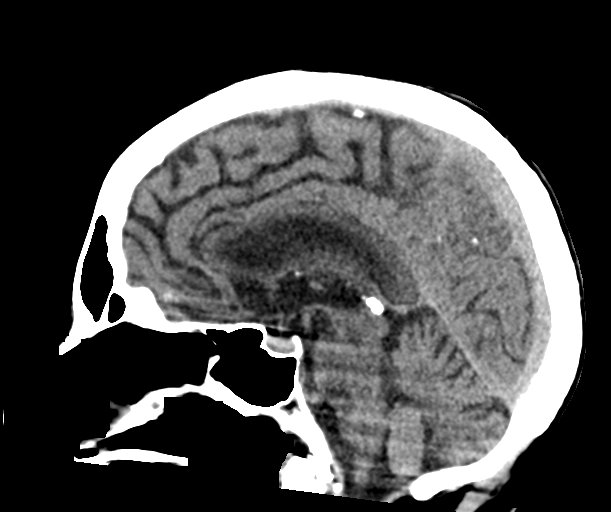
[im 35/52  brain]
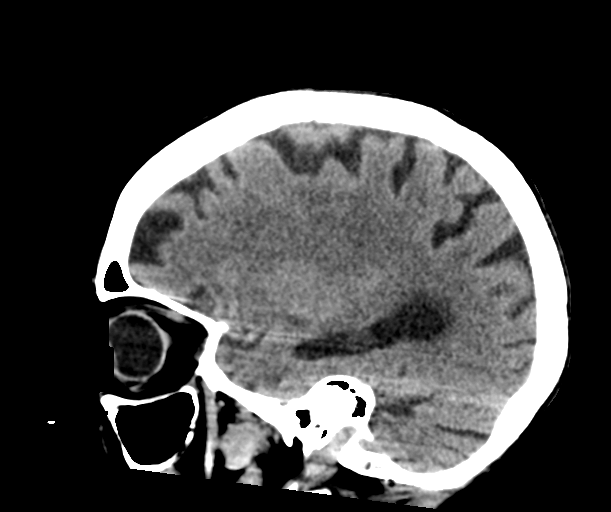

[16 of 47 positions shown; findings below may reference images not displayed]

FINDINGS: Brain: Mild chronic ischemic white matter disease is noted. No mass
effect or midline shift is noted. Ventricular size is within normal
limits. There is no evidence of mass lesion, hemorrhage or acute
infarction.

Vascular: No hyperdense vessel or unexpected calcification.

Skull: Normal. Negative for fracture or focal lesion.

Sinuses/Orbits: No acute finding.

Other: None.
IMPRESSION: Mild chronic ischemic white matter disease. No acute intracranial
abnormality seen.
